# Patient Record
Sex: Female | Born: 1940 | Race: White | Hispanic: No | Marital: Married | Smoking: Former smoker
Health system: Southern US, Community
[De-identification: ages and names within clinical notes are randomized; demographics above are authoritative.]

## PROBLEM LIST (undated history)

## (undated) DIAGNOSIS — Z801 Family history of malignant neoplasm of trachea, bronchus and lung: Secondary | ICD-10-CM

## (undated) DIAGNOSIS — I10 Essential (primary) hypertension: Secondary | ICD-10-CM

## (undated) DIAGNOSIS — Z8042 Family history of malignant neoplasm of prostate: Secondary | ICD-10-CM

## (undated) DIAGNOSIS — Z923 Personal history of irradiation: Secondary | ICD-10-CM

## (undated) DIAGNOSIS — C50911 Malignant neoplasm of unspecified site of right female breast: Secondary | ICD-10-CM

## (undated) DIAGNOSIS — Z807 Family history of other malignant neoplasms of lymphoid, hematopoietic and related tissues: Secondary | ICD-10-CM

## (undated) DIAGNOSIS — M1990 Unspecified osteoarthritis, unspecified site: Secondary | ICD-10-CM

## (undated) DIAGNOSIS — Z803 Family history of malignant neoplasm of breast: Secondary | ICD-10-CM

## (undated) DIAGNOSIS — D649 Anemia, unspecified: Secondary | ICD-10-CM

## (undated) DIAGNOSIS — N189 Chronic kidney disease, unspecified: Secondary | ICD-10-CM

## (undated) DIAGNOSIS — C801 Malignant (primary) neoplasm, unspecified: Secondary | ICD-10-CM

## (undated) DIAGNOSIS — R7303 Prediabetes: Secondary | ICD-10-CM

## (undated) DIAGNOSIS — C50919 Malignant neoplasm of unspecified site of unspecified female breast: Secondary | ICD-10-CM

## (undated) HISTORY — DX: Family history of other malignant neoplasms of lymphoid, hematopoietic and related tissues: Z80.7

## (undated) HISTORY — DX: Family history of malignant neoplasm of prostate: Z80.42

## (undated) HISTORY — PX: EYE SURGERY: SHX253

## (undated) HISTORY — PX: COLONOSCOPY: SHX174

## (undated) HISTORY — DX: Family history of malignant neoplasm of trachea, bronchus and lung: Z80.1

## (undated) HISTORY — DX: Family history of malignant neoplasm of breast: Z80.3

## (undated) HISTORY — DX: Malignant (primary) neoplasm, unspecified: C80.1

## (undated) HISTORY — PX: MASTECTOMY: SHX3

## (undated) HISTORY — DX: Essential (primary) hypertension: I10

## (undated) HISTORY — DX: Malignant neoplasm of unspecified site of right female breast: C50.911

## (undated) HISTORY — PX: BREAST SURGERY: SHX581

---
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
## 2000-02-01 DIAGNOSIS — C50919 Malignant neoplasm of unspecified site of unspecified female breast: Secondary | ICD-10-CM

## 2000-02-01 HISTORY — DX: Malignant neoplasm of unspecified site of unspecified female breast: C50.919

## 2000-02-01 HISTORY — PX: BREAST LUMPECTOMY: SHX2

## 2009-01-31 HISTORY — PX: BREAST LUMPECTOMY: SHX2

## 2013-01-31 HISTORY — PX: CATARACT EXTRACTION: SUR2

## 2016-02-01 HISTORY — PX: ABDOMINAL HYSTERECTOMY: SHX81

## 2018-06-28 ENCOUNTER — Other Ambulatory Visit: Payer: Self-pay

## 2018-06-29 ENCOUNTER — Encounter: Payer: Self-pay | Admitting: Family Medicine

## 2018-06-29 ENCOUNTER — Ambulatory Visit (INDEPENDENT_AMBULATORY_CARE_PROVIDER_SITE_OTHER): Payer: Medicare Other | Admitting: Family Medicine

## 2018-06-29 ENCOUNTER — Telehealth: Payer: Self-pay | Admitting: Family Medicine

## 2018-06-29 VITALS — BP 122/64 | HR 85 | Temp 98.4°F | Resp 16 | Ht 60.5 in | Wt 152.0 lb

## 2018-06-29 DIAGNOSIS — R944 Abnormal results of kidney function studies: Secondary | ICD-10-CM

## 2018-06-29 DIAGNOSIS — Z1231 Encounter for screening mammogram for malignant neoplasm of breast: Secondary | ICD-10-CM | POA: Diagnosis not present

## 2018-06-29 DIAGNOSIS — E1169 Type 2 diabetes mellitus with other specified complication: Secondary | ICD-10-CM

## 2018-06-29 DIAGNOSIS — Z780 Asymptomatic menopausal state: Secondary | ICD-10-CM | POA: Diagnosis not present

## 2018-06-29 DIAGNOSIS — I10 Essential (primary) hypertension: Secondary | ICD-10-CM | POA: Insufficient documentation

## 2018-06-29 DIAGNOSIS — E785 Hyperlipidemia, unspecified: Secondary | ICD-10-CM | POA: Diagnosis not present

## 2018-06-29 DIAGNOSIS — B029 Zoster without complications: Secondary | ICD-10-CM

## 2018-06-29 DIAGNOSIS — K8020 Calculus of gallbladder without cholecystitis without obstruction: Secondary | ICD-10-CM

## 2018-06-29 DIAGNOSIS — F32A Depression, unspecified: Secondary | ICD-10-CM

## 2018-06-29 DIAGNOSIS — F329 Major depressive disorder, single episode, unspecified: Secondary | ICD-10-CM

## 2018-06-29 DIAGNOSIS — R1011 Right upper quadrant pain: Secondary | ICD-10-CM

## 2018-06-29 LAB — TSH: TSH: 3.06 u[IU]/mL (ref 0.35–4.50)

## 2018-06-29 LAB — COMPREHENSIVE METABOLIC PANEL
ALT: 13 U/L (ref 0–35)
AST: 16 U/L (ref 0–37)
Albumin: 4.1 g/dL (ref 3.5–5.2)
Alkaline Phosphatase: 79 U/L (ref 39–117)
BUN: 31 mg/dL — ABNORMAL HIGH (ref 6–23)
CO2: 28 mEq/L (ref 19–32)
Calcium: 9.9 mg/dL (ref 8.4–10.5)
Chloride: 99 mEq/L (ref 96–112)
Creatinine, Ser: 1.5 mg/dL — ABNORMAL HIGH (ref 0.40–1.20)
GFR: 33.75 mL/min — ABNORMAL LOW (ref >60.00)
Glucose, Bld: 118 mg/dL — ABNORMAL HIGH (ref 70–99)
Potassium: 3.9 mEq/L (ref 3.5–5.1)
Sodium: 135 mEq/L (ref 135–145)
Total Bilirubin: 0.4 mg/dL (ref 0.2–1.2)
Total Protein: 7.4 g/dL (ref 6.0–8.3)

## 2018-06-29 LAB — CBC WITH DIFFERENTIAL/PLATELET
Basophils Absolute: 0 10*3/uL (ref 0.0–0.1)
Basophils Relative: 0.7 % (ref 0.0–3.0)
Eosinophils Absolute: 0.1 10*3/uL (ref 0.0–0.7)
Eosinophils Relative: 1.4 % (ref 0.0–5.0)
HCT: 35.5 % — ABNORMAL LOW (ref 36.0–46.0)
Hemoglobin: 11.8 g/dL — ABNORMAL LOW (ref 12.0–15.0)
Lymphocytes Relative: 26.7 % (ref 12.0–46.0)
Lymphs Abs: 1.3 10*3/uL (ref 0.7–4.0)
MCHC: 33.3 g/dL (ref 30.0–36.0)
MCV: 77.6 fl — ABNORMAL LOW (ref 78.0–100.0)
Monocytes Absolute: 0.5 10*3/uL (ref 0.1–1.0)
Monocytes Relative: 10.3 % (ref 3.0–12.0)
Neutro Abs: 2.9 10*3/uL (ref 1.4–7.7)
Neutrophils Relative %: 60.9 % (ref 43.0–77.0)
Platelets: 213 10*3/uL (ref 150.0–400.0)
RBC: 4.57 Mil/uL (ref 3.87–5.11)
RDW: 14.9 % (ref 11.5–15.5)
WBC: 4.8 10*3/uL (ref 4.0–10.5)

## 2018-06-29 LAB — LIPID PANEL
Cholesterol: 135 mg/dL (ref 0–200)
HDL: 44.8 mg/dL (ref >39.00)
LDL Cholesterol: 70 mg/dL (ref 0–99)
NonHDL: 89.91
Total CHOL/HDL Ratio: 3
Triglycerides: 100 mg/dL (ref 0.0–149.0)
VLDL: 20 mg/dL (ref 0.0–40.0)

## 2018-06-29 LAB — LIPASE: Lipase: 69 U/L — ABNORMAL HIGH (ref 11.0–59.0)

## 2018-06-29 LAB — HEMOGLOBIN A1C: Hgb A1c MFr Bld: 6.4 % (ref 4.6–6.5)

## 2018-06-29 LAB — AMYLASE: Amylase: 78 U/L (ref 27–131)

## 2018-06-29 MED ORDER — VALACYCLOVIR HCL 1 G PO TABS: 1000.0000 mg | ORAL_TABLET | Freq: Three times a day (TID) | ORAL | 0 refills | Status: AC

## 2018-06-29 NOTE — Progress Notes (Signed)
Subjective:    Patient ID: Tracy Lawrence, female    DOB: Jun 23, 1940, 78 y.o.   MRN: [id number]  HPI   Patient presents to clinic to establish with PCP.  She recently moved to area from the [place] to be closer to her children and grandchildren.  Patient is a diabetic has high cholesterol, depression and history of gout.  Per patient she is stable on current medications.  She currently has not been checking her blood sugar regularly however has not felt any symptoms of hypoglycemia.  Mood is stable on current dose of Lexapro.  Denies any SI or HI.  She is happy to be closer to her family.  Tolerating simvastatin without any problems.  Denies body aches.  Patient has been having some right upper quadrant pain off and on.  Does not seem to coincide with any type of food or with eating.  States it will flareup very painful for a few minutes and then will ease off on its own.  Denies any vomiting or diarrhea.  Denies fever or chills.  Patient also has a rash on her back, not sure what the rash is from.  States the rash is a little itchy and is a little tender.  Patient Active Problem List   Diagnosis Date Noted  . Hyperlipidemia associated with type 2 diabetes mellitus ([place]) 06/29/2018  . Essential hypertension 06/29/2018   Past Medical History:  Diagnosis Date  . Cancer ([place])   . Diabetes mellitus without complication ([place])   . Hypertension    Past Surgical History:  Procedure Laterality Date  . ABDOMINAL HYSTERECTOMY  2018  . BREAST SURGERY  2011,2002  . CATARACT EXTRACTION  2015   Family History  Problem Relation Age of Onset  . Heart attack Mother   . Cancer Father    Review of Systems  Constitutional: Negative for chills, fatigue and fever.  HENT: Negative for congestion, ear pain, sinus pain and sore throat.   Eyes: Negative.   Respiratory: Negative for cough, shortness of breath and wheezing.   Cardiovascular: Negative for chest pain, palpitations and leg swelling.   Gastrointestinal: +RUQ ABD pain. Negative for diarrhea, nausea and vomiting.  Genitourinary: Negative for dysuria, frequency and urgency.  Musculoskeletal: Negative for arthralgias and myalgias.  Skin: Negative for color change, pallor. +rash on back  Neurological: Negative for syncope, light-headedness and headaches.  Psychiatric/Behavioral: The patient is not nervous/anxious.       Objective:   Physical Exam Vitals signs and nursing note reviewed.  Constitutional:      General: She is not in acute distress.    Appearance: She is not toxic-appearing.  HENT:     Head: Normocephalic and atraumatic.  Eyes:     General: No scleral icterus.    Extraocular Movements: Extraocular movements intact.     Conjunctiva/sclera: Conjunctivae normal.     Pupils: Pupils are equal, round, and reactive to light.  Cardiovascular:     Rate and Rhythm: Normal rate and regular rhythm.     Heart sounds: Normal heart sounds.  Pulmonary:     Effort: Pulmonary effort is normal. No respiratory distress.     Breath sounds: Normal breath sounds.  Abdominal:     General: Bowel sounds are normal. There is no distension.     Palpations: Abdomen is soft. There is no mass.     Tenderness: There is abdominal tenderness (RUQ tenderness). There is no right CVA tenderness, left CVA tenderness, guarding or rebound.  Skin:  General: Skin is warm and dry.     Coloration: Skin is not jaundiced or pale.     Findings: Rash present.          Comments: +red slightly raised rash on back indicated by red mark on diagram. Rash does appear to be an early shingles.   Neurological:     General: No focal deficit present.     Mental Status: She is alert and oriented to person, place, and time.     Motor: No weakness.     Gait: Gait normal.  Psychiatric:        Mood and Affect: Mood normal.        Behavior: Behavior normal.        Thought Content: Thought content normal.    Vitals:   06/29/18 1315  BP: 122/64   Pulse: 85  Resp: 16  Temp: 98.4 F (36.9 C)  SpO2: 96%      Assessment & Plan:   Hyperlipidemia associated with type 2 diabetes- patient tolerating simvastatin any problems.  Diabetes has been well controlled on metformin for many years.  She will continue this.  We will get new labs including lipid panel and A1c for continued monitoring.  Depressive disorder -- Mood stable on lexapro  Screening mammogram and DEXA scan ordered for breast cancer screening and also osteoporosis screening/monitoring due to patient being postmenopausal.  Right upper quadrant pain-pain could be related to issues with gallbladder.  We will get ultrasound to further investigate we also include amylase, lipase, CBC and CMP in her lab work.   Shingles -- Patient's rash on back does appear to be consistent with an early onset of shingles.  We will treat with Valtrex 3 times daily for 7 days.  Advised she can use Tylenol as needed for any pain.  Advised to let us know if rash gets bigger, more painful and we can give her something stronger for pain if needed.  Once we have lab results and ultrasound results we will better able to know next step in plan of care in regards to upper abdominal pain.  Advised to eat a bland diet and drink clear liquids over the next few days and monitor pain for any changes.  Patient will follow-up every 6 months for monitoring of chronic medical conditions.  She is aware she can return to clinic sooner if any issues arise.  She is also aware that we may need to have her come in sooner pending lab work results.

## 2018-06-29 NOTE — Patient Instructions (Addendum)
Flu vaccines will be available around September!

## 2018-06-29 NOTE — Telephone Encounter (Signed)
Need pharmacy to send valtrex to

## 2018-07-02 DIAGNOSIS — F329 Major depressive disorder, single episode, unspecified: Secondary | ICD-10-CM | POA: Insufficient documentation

## 2018-07-02 DIAGNOSIS — F32A Depression, unspecified: Secondary | ICD-10-CM | POA: Insufficient documentation

## 2018-07-03 NOTE — Telephone Encounter (Signed)
Called Pt and spoke to her about what pharmacy she uses and she stated [facility] on [address] in [place]

## 2018-07-04 ENCOUNTER — Other Ambulatory Visit (INDEPENDENT_AMBULATORY_CARE_PROVIDER_SITE_OTHER): Payer: Medicare Other

## 2018-07-04 ENCOUNTER — Other Ambulatory Visit: Payer: Self-pay

## 2018-07-04 DIAGNOSIS — R944 Abnormal results of kidney function studies: Secondary | ICD-10-CM | POA: Diagnosis not present

## 2018-07-04 DIAGNOSIS — N183 Type 2 diabetes mellitus with diabetic chronic kidney disease: Secondary | ICD-10-CM

## 2018-07-04 DIAGNOSIS — E1122 Type 2 diabetes mellitus with diabetic chronic kidney disease: Secondary | ICD-10-CM

## 2018-07-04 LAB — COMPREHENSIVE METABOLIC PANEL
ALT: 15 U/L (ref 0–35)
AST: 17 U/L (ref 0–37)
Albumin: 3.9 g/dL (ref 3.5–5.2)
Alkaline Phosphatase: 73 U/L (ref 39–117)
BUN: 21 mg/dL (ref 6–23)
CO2: 26 mEq/L (ref 19–32)
Calcium: 9.8 mg/dL (ref 8.4–10.5)
Chloride: 100 mEq/L (ref 96–112)
Creatinine, Ser: 1.37 mg/dL — ABNORMAL HIGH (ref 0.40–1.20)
GFR: 37.47 mL/min — ABNORMAL LOW (ref >60.00)
Glucose, Bld: 142 mg/dL — ABNORMAL HIGH (ref 70–99)
Potassium: 4 mEq/L (ref 3.5–5.1)
Sodium: 133 mEq/L — ABNORMAL LOW (ref 135–145)
Total Bilirubin: 0.3 mg/dL (ref 0.2–1.2)
Total Protein: 7.2 g/dL (ref 6.0–8.3)

## 2018-07-06 MED ORDER — LINAGLIPTIN 5 MG PO TABS: 5.0000 mg | ORAL_TABLET | Freq: Every day | ORAL | 1 refills | Status: DC

## 2018-07-06 NOTE — Addendum Note (Signed)
Addended by: Philis Nettle on: 07/06/2018 07:46 AM   Modules accepted: Orders

## 2018-07-10 ENCOUNTER — Ambulatory Visit: Payer: Medicare Other

## 2018-07-11 ENCOUNTER — Other Ambulatory Visit: Payer: Self-pay

## 2018-07-11 ENCOUNTER — Ambulatory Visit
Admission: RE | Admit: 2018-07-11 | Discharge: 2018-07-11 | Disposition: A | Discharge status: Home / Self Care | Payer: Medicare Other | Source: Ambulatory Visit | Attending: Family Medicine | Admitting: Family Medicine

## 2018-07-11 DIAGNOSIS — R1011 Right upper quadrant pain: Secondary | ICD-10-CM | POA: Diagnosis not present

## 2018-07-13 NOTE — Addendum Note (Signed)
Addended by: Philis Nettle on: 07/13/2018 08:32 AM   Modules accepted: Orders

## 2018-07-19 ENCOUNTER — Encounter: Payer: Self-pay | Admitting: Family Medicine

## 2018-07-24 ENCOUNTER — Other Ambulatory Visit: Payer: Self-pay | Admitting: Family Medicine

## 2018-07-24 DIAGNOSIS — Z1231 Encounter for screening mammogram for malignant neoplasm of breast: Secondary | ICD-10-CM

## 2018-07-25 ENCOUNTER — Encounter: Payer: Self-pay | Admitting: General Surgery

## 2018-07-25 ENCOUNTER — Other Ambulatory Visit: Payer: Self-pay

## 2018-07-25 ENCOUNTER — Ambulatory Visit (INDEPENDENT_AMBULATORY_CARE_PROVIDER_SITE_OTHER): Payer: Medicare Other | Admitting: General Surgery

## 2018-07-25 VITALS — BP 130/62 | HR 102 | Temp 97.3°F | Resp 18 | Ht 61.0 in | Wt 152.0 lb

## 2018-07-25 DIAGNOSIS — K8020 Calculus of gallbladder without cholecystitis without obstruction: Secondary | ICD-10-CM

## 2018-07-25 NOTE — Patient Instructions (Addendum)
Gallbladder Eating Plan If you have a gallbladder condition, you may have trouble digesting fats. Eating a low-fat diet can help reduce your symptoms, and may be helpful before and after having surgery to remove your gallbladder (cholecystectomy). Your health care provider may recommend that you work with a diet and nutrition specialist (dietitian) to help you reduce the amount of fat in your diet. What are tips for following this plan? General guidelines  Limit your fat intake to less than 30% of your total daily calories. If you eat around 1,800 calories each day, this is less than 60 grams (g) of fat per day.  Fat is an important part of a healthy diet. Eating a low-fat diet can make it hard to maintain a healthy body weight. Ask your dietitian how much fat, calories, and other nutrients you need each day.  Eat small, frequent meals throughout the day instead of three large meals.  Drink at least 8-10 cups of fluid a day. Drink enough fluid to keep your urine clear or pale yellow.  Limit alcohol intake to no more than 1 drink a day for nonpregnant women and 2 drinks a day for men. One drink equals 12 oz of beer, 5 oz of wine, or 1 oz of hard liquor. Reading food labels  Check Nutrition Facts on food labels for the amount of fat per serving. Choose foods with less than 3 grams of fat per serving. Shopping  Choose nonfat and low-fat healthy foods. Look for the words "nonfat," "low fat," or "fat free."  Avoid buying processed or prepackaged foods. Cooking  Cook using low-fat methods, such as baking, broiling, grilling, or boiling.  Cook with small amounts of healthy fats, such as olive oil, grapeseed oil, canola oil, or sunflower oil. What foods are recommended?   All fresh, frozen, or canned fruits and vegetables.  Whole grains.  Low-fat or non-fat (skim) milk and yogurt.  Lean meat, skinless poultry, fish, eggs, and beans.  Low-fat protein supplement powders or  drinks.  Spices and herbs. What foods are not recommended?  High-fat foods. These include baked goods, fast food, fatty cuts of meat, ice cream, french toast, sweet rolls, pizza, cheese bread, foods covered with butter, creamy sauces, or cheese.  Fried foods. These include french fries, tempura, battered fish, breaded chicken, fried breads, and sweets.  Foods with strong odors.  Foods that cause bloating and gas. Summary  A low-fat diet can be helpful if you have a gallbladder condition, or before and after gallbladder surgery.  Limit your fat intake to less than 30% of your total daily calories. This is about 60 g of fat if you eat 1,800 calories each day.  Eat small, frequent meals throughout the day instead of three large meals. This information is not intended to replace advice given to you by your health care provider. Make sure you discuss any questions you have with your health care provider. Document Released: 01/22/2013 Document Revised: 02/25/2016 Document Reviewed: 02/25/2016 Elsevier Interactive Patient Education  2019 Elsevier Inc.  

## 2018-07-25 NOTE — Progress Notes (Signed)
Patient ID: Tracy Lawrence, female   DOB: [birth year], 78 y.o.   MRN: [id number]  Chief Complaint  Patient presents with  . New Patient (Initial Visit)    Gallbladder    HPI Tracy Lawrence is a 78 y.o. female.  Tracy Lawrence has been referred by her primary care provider, Philis Nettle, FNP, for evaluation of cholelithiasis.  Tracy Lawrence states that about 2 weeks ago, Tracy Lawrence had a shingles attack.  At the same time, Tracy Lawrence experienced some right upper quadrant pain that radiated around her back.  Incidentally, this was in the same distribution of her shingles lesions.  Tracy Lawrence says that the pain only lasted for a couple of days and then resolved.  Tracy Lawrence denies any associated nausea or vomiting.  There did not seem to be any association with food intake.  Tracy Lawrence reports that Tracy Lawrence does not eat a lot of fatty foods to begin with.  Tracy Lawrence did not have any diarrhea or constipation.  Tracy Lawrence has never had pancreatitis, nor has Tracy Lawrence ever been jaundiced.  Laboratory studies did not show any hepatobiliary abnormalities.  A right upper quadrant ultrasound was performed that did show evidence of cholelithiasis without cholecystitis.   Past Medical History:  Diagnosis Date  . Cancer ([employer])   . Diabetes mellitus without complication ([place])   . Hypertension     Past Surgical History:  Procedure Laterality Date  . ABDOMINAL HYSTERECTOMY  2018  . BREAST SURGERY  2011,2002  . CATARACT EXTRACTION  2015    Family History  Problem Relation Age of Onset  . Heart attack Mother   . Cancer Father     Social History Social History   Tobacco Use  . Smoking status: Former Smoker    Quit date: 06/29/2018    Years since quitting: 0.0  . Smokeless tobacco: Never Used  Substance Use Topics  . Alcohol use: Yes    Comment: ocassionally  . Drug use: Never    Not on File  Current Outpatient Medications  Medication Sig Dispense Refill  . allopurinol (ZYLOPRIM) 100 MG tablet Take 100 mg by mouth daily.    Marland Kitchen escitalopram (LEXAPRO) 10 MG tablet  Take 10 mg by mouth daily.    Marland Kitchen linagliptin (TRADJENTA) 5 MG TABS tablet Take 1 tablet (5 mg total) by mouth daily. 90 tablet 1  . simvastatin (ZOCOR) 80 MG tablet Take 80 mg by mouth daily.     No current facility-administered medications for this visit.     Review of Systems Review of Systems  All other systems reviewed and are negative.   Blood pressure 130/62, pulse (!) 102, temperature (!) 97.3 F (36.3 C), temperature source Temporal, resp. rate 18, height 5\' 1"  (1.549 m), weight 152 lb (68.9 kg), SpO2 98 %.  Physical Exam Physical Exam Vitals signs reviewed.  Constitutional:      General: Tracy Lawrence is not in acute distress.    Appearance: Normal appearance.  HENT:     Head: Normocephalic and atraumatic.     Nose:     Comments: Covered with a mask secondary to COVID-19 precautions    Mouth/Throat:     Comments: Covered with a mask secondary to COVID-19 precautions Eyes:     General: No scleral icterus.       Right eye: No discharge.        Left eye: No discharge.     Conjunctiva/sclera: Conjunctivae normal.  Neck:     Musculoskeletal: Normal range of motion and neck supple.  Cardiovascular:  Rate and Rhythm: Normal rate and regular rhythm.     Pulses: Normal pulses.     Heart sounds: Normal heart sounds.  Pulmonary:     Effort: Pulmonary effort is normal.     Breath sounds: Normal breath sounds.  Abdominal:     General: Abdomen is flat. Bowel sounds are normal. There is no distension.     Palpations: Abdomen is soft. There is no mass.     Tenderness: There is no abdominal tenderness.     Comments: Laparoscopic port site scars from her hysterectomy.  Genitourinary:    Comments: Deferred Musculoskeletal: Normal range of motion.        General: No swelling.     Right lower leg: No edema.     Left lower leg: No edema.  Lymphadenopathy:     Cervical: No cervical adenopathy.  Skin:    General: Skin is warm and dry.     Coloration: Skin is not jaundiced.   Neurological:     General: No focal deficit present.     Mental Status: Tracy Lawrence is alert and oriented to person, place, and time.  Psychiatric:        Behavior: Behavior normal.     Data Reviewed Results for KRYSTALE, RINKENBERGER (MRN [id number]) as of 07/25/2018 09:52  Ref. Range 06/29/2018 13:35 07/04/2018 10:48  COMPREHENSIVE METABOLIC PANEL Unknown Rpt (A) Rpt (A)  Sodium Latest Ref Range: 135 - 145 mEq/L 135 133 (L)  Potassium Latest Ref Range: 3.5 - 5.1 mEq/L 3.9 4.0  Chloride Latest Ref Range: 96 - 112 mEq/L 99 100  CO2 Latest Ref Range: 19 - 32 mEq/L 28 26  Glucose Latest Ref Range: 70 - 99 mg/dL 118 (H) 142 (H)  BUN Latest Ref Range: 6 - 23 mg/dL 31 (H) 21  Creatinine Latest Ref Range: 0.40 - 1.20 mg/dL 1.50 (H) 1.37 (H)  Calcium Latest Ref Range: 8.4 - 10.5 mg/dL 9.9 9.8  Alkaline Phosphatase Latest Ref Range: 39 - 117 U/L 79 73  Albumin Latest Ref Range: 3.5 - 5.2 g/dL 4.1 3.9  Amylase, Serum Latest Ref Range: 27 - 131 U/L 78   Lipase Latest Ref Range: 11.0 - 59.0 U/L 69.0 (H)   AST Latest Ref Range: 0 - 37 U/L 16 17  ALT Latest Ref Range: 0 - 35 U/L 13 15  Total Protein Latest Ref Range: 6.0 - 8.3 g/dL 7.4 7.2  Total Bilirubin Latest Ref Range: 0.2 - 1.2 mg/dL 0.4 0.3  GFR Latest Ref Range: >60.00 mL/min 33.75 (L) 37.47 (L)  Total CHOL/HDL Ratio Unknown 3   Cholesterol Latest Ref Range: 0 - 200 mg/dL 135   HDL Cholesterol Latest Ref Range: >39.00 mg/dL 44.80   LDL (calc) Latest Ref Range: 0 - 99 mg/dL 70   NonHDL Unknown 89.91   Triglycerides Latest Ref Range: 0.0 - 149.0 mg/dL 100.0   VLDL Latest Ref Range: 0.0 - 40.0 mg/dL 20.0   WBC Latest Ref Range: 4.0 - 10.5 K/uL 4.8   RBC Latest Ref Range: 3.87 - 5.11 Mil/uL 4.57   Hemoglobin Latest Ref Range: 12.0 - 15.0 g/dL 11.8 (L)   HCT Latest Ref Range: 36.0 - 46.0 % 35.5 (L)   MCV Latest Ref Range: 78.0 - 100.0 fl 77.6 (L)   MCHC Latest Ref Range: 30.0 - 36.0 g/dL 33.3   RDW Latest Ref Range: 11.5 - 15.5 % 14.9   Platelets  Latest Ref Range: 150.0 - 400.0 K/uL 213.0   I personally reviewed these  labs.  As stated in the history of present illness, there is no evidence of hepatobiliary disease based upon these lab results  CLINICAL DATA:  RIGHT upper quadrant abdominal pain intermittently for 2 months  EXAM: ABDOMEN ULTRASOUND COMPLETE  COMPARISON:  None  FINDINGS: Gallbladder: Multiple gallstones in gallbladder up to 10 mm diameter. Small amount of sludge. No gallbladder wall thickening, pericholecystic fluid, or sonographic Murphy sign.  Common bile duct: Diameter: 6 mm diameter, normal for age  Liver: Normal echogenicity. No definite hepatic mass or nodularity. Portal vein is patent on color Doppler imaging with normal direction of blood flow towards the liver.  IVC: Normal appearance  Pancreas: Portions of head and tail incompletely visualized due to bowel gas. Visualized portions normal appearance.  Spleen: Normal appearance, 6.3 cm length  Right Kidney: Length: 7.4 cm. Atrophic appearance, with thinned cortex and increased cortical echogenicity. No definite mass hydronephrosis.  Left Kidney: Length: 9.7 cm. Normal cortical thickness. Increased cortical echogenicity. No mass, hydronephrosis or shadowing calcification.  Abdominal aorta: Normal caliber.  Other findings: No free fluid  IMPRESSION: Cholelithiasis.  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  Incomplete pancreatic visualization.  No acute abnormalities otherwise identified.   Electronically Signed   By: Lavonia Dana M.D.   On: 07/11/2018 15:35  I personally reviewed the ultrasound, and agree with the radiologist findings.  Specifically, Tracy Lawrence does have gallstones, but there is no evidence of cholecystitis or choledocholithiasis.  Assessment This is a 78 year old woman with a number of medical comorbidities.  Tracy Lawrence had one episode of right upper quadrant pain  about 2 weeks ago, in conjunction with a shingles attack.  While Tracy Lawrence does have cholelithiasis, this is been her only episode of right upper quadrant pain.  Plan I discussed cholelithiasis and gallbladder disease with the patient.  I explained that many people have gallstones but never go on to have any symptoms.  Tracy Lawrence has had a single episode of right upper quadrant pain, it happened during a shingles attack and her lesions were in the same distribution of the pain Tracy Lawrence experienced.  At this time, Tracy Lawrence does not need to have an operation, unless Tracy Lawrence specifically desires one.  For now, Tracy Lawrence would prefer to simply defer, unless Tracy Lawrence has further episodes of symptomatic cholelithiasis.  We have not made a specific return appointment for her, however Tracy Lawrence is welcome to return at any time.    Fredirick Maudlin 07/25/2018, 9:48 AM

## 2018-09-11 ENCOUNTER — Ambulatory Visit
Admission: RE | Admit: 2018-09-11 | Discharge: 2018-09-11 | Disposition: A | Discharge status: Home / Self Care | Payer: Medicare Other | Source: Ambulatory Visit | Attending: Family Medicine | Admitting: Family Medicine

## 2018-09-11 DIAGNOSIS — Z780 Asymptomatic menopausal state: Secondary | ICD-10-CM

## 2018-09-11 DIAGNOSIS — Z1231 Encounter for screening mammogram for malignant neoplasm of breast: Secondary | ICD-10-CM | POA: Diagnosis present

## 2018-10-04 HISTORY — PX: BREAST BIOPSY: SHX20

## 2018-10-17 ENCOUNTER — Other Ambulatory Visit: Payer: Self-pay

## 2018-10-17 ENCOUNTER — Ambulatory Visit (INDEPENDENT_AMBULATORY_CARE_PROVIDER_SITE_OTHER): Payer: Medicare Other

## 2018-10-17 DIAGNOSIS — Z0000 Encounter for general adult medical examination without abnormal findings: Secondary | ICD-10-CM | POA: Diagnosis not present

## 2018-10-17 NOTE — Progress Notes (Signed)
Subjective:   Louberta Heldreth is a 78 y.o. female who presents for an Initial Medicare Annual Wellness Visit.  Review of Systems    No ROS.  Medicare Wellness Virtual Visit.  Visual/audio telehealth visit, UTA vital signs.   See social history for additional risk factors.    Cardiac Risk Factors include: advanced age (>16men, >51 women);hypertension     Objective:    Today's Vitals   There is no height or weight on file to calculate BMI.  Advanced Directives 10/17/2018  Does Patient Have a Medical Advance Directive? Yes  Type of Advance Directive Living will;Healthcare Power of Attorney  Does patient want to make changes to medical advance directive? No - Patient declined  Copy of [place] in Chart? No - copy requested    Current Medications (verified) Outpatient Encounter Medications as of 10/17/2018  Medication Sig  . allopurinol (ZYLOPRIM) 100 MG tablet Take 100 mg by mouth daily.  Marland Kitchen escitalopram (LEXAPRO) 10 MG tablet Take 10 mg by mouth daily.  Marland Kitchen linagliptin (TRADJENTA) 5 MG TABS tablet Take 1 tablet (5 mg total) by mouth daily.  . simvastatin (ZOCOR) 80 MG tablet Take 80 mg by mouth daily.   No facility-administered encounter medications on file as of 10/17/2018.     Allergies (verified) Patient has no allergy information on record.   History: Past Medical History:  Diagnosis Date  . Cancer ([place])   . Diabetes mellitus without complication ([place])   . Hypertension    Past Surgical History:  Procedure Laterality Date  . ABDOMINAL HYSTERECTOMY  2018  . BREAST LUMPECTOMY Right 2002   breast ca with rad  . BREAST LUMPECTOMY Left 2011   breast ca with rad  . BREAST SURGERY  2011,2002  . CATARACT EXTRACTION  2015   Family History  Problem Relation Age of Onset  . Heart attack Mother   . Cancer Father   . Breast cancer Sister 39       dx twice age 57   Social History   Socioeconomic History  . Marital status: Married    Spouse name:  Not on file  . Number of children: Not on file  . Years of education: Not on file  . Highest education level: Not on file  Occupational History  . Not on file  Social Needs  . Financial resource strain: Not hard at all  . Food insecurity    Worry: Never true    Inability: Never true  . Transportation needs    Medical: No    Non-medical: No  Tobacco Use  . Smoking status: Former Smoker    Quit date: 06/29/2018    Years since quitting: 0.3  . Smokeless tobacco: Never Used  Substance and Sexual Activity  . Alcohol use: Yes    Comment: ocassionally  . Drug use: Never  . Sexual activity: Not on file  Lifestyle  . Physical activity    Days per week: 3 days    Minutes per session: 50 min  . Stress: Not at all  Relationships  . Social Herbalist on phone: Not on file    Gets together: Not on file    Attends religious service: Not on file    Active member of club or organization: Not on file    Attends meetings of clubs or organizations: Not on file    Relationship status: Not on file  Other Topics Concern  . Not on file  Social History  Narrative  . Not on file    Tobacco Counseling Counseling given: Not Answered   Clinical Intake:  Pre-visit preparation completed: Yes        Diabetes: Yes(Followed by pcp)  How often do you need to have someone help you when you read instructions, pamphlets, or other written materials from your doctor or pharmacy?: 1 - Never  Interpreter Needed?: No      Activities of Daily Living In your present state of health, do you have any difficulty performing the following activities: 10/17/2018  Hearing? N  Vision? N  Difficulty concentrating or making decisions? N  Walking or climbing stairs? N  Dressing or bathing? N  Doing errands, shopping? N  Preparing Food and eating ? N  Using the Toilet? N  In the past six months, have you accidently leaked urine? N  Do you have problems with loss of bowel control? N   Managing your Medications? N  Managing your Finances? N  Housekeeping or managing your Housekeeping? N     Immunizations and Health Maintenance  There is no immunization history on file for this patient. Health Maintenance Due  Topic Date Due  . FOOT EXAM  05/01/1950  . OPHTHALMOLOGY EXAM  05/01/1952  . URINE MICROALBUMIN  05/01/1952  . TETANUS/TDAP  05/01/1961  . PNA vac Low Risk Adult (1 of 2 - PCV13) 05/02/2007    Patient Care Team: Jodelle Green, FNP as PCP - General (Family Medicine)  Indicate any recent Medical Services you may have received from other than [facility] providers in the past year (date may be approximate).     Assessment:   This is a routine wellness examination for Sheldon.  I connected with patient 10/17/18 at  9:30 AM EDT by an audio enabled telemedicine application and verified that I am speaking with the correct person using two identifiers. Patient stated full name and DOB. Patient gave permission to continue with virtual visit. Patient's location was at home and Nurse's location was at [place] office.   Health Maintenance Due: -Influenza vaccine 2020- discussed; to be completed in season with doctor or local pharmacy.   -PNA - discussed; she thinks she had PVC 13 but plans to verify with her doctor -Tdap- discussed; to be completed with doctor in visit or local pharmacy.   -Eye Exam- she plans to schedule once established -Foot Exam- followed by pcp -Hgb A1c- 06/29/18 (6.4) Update all pending maintenance due as appropriate.   See completed HM at the end of note.   Eye: Visual acuity not assessed. Virtual visit. Wears corrective lenses.  Retinopathy- none reported  Dental: Visits every 12 months.    Hearing: Demonstrates normal hearing during visit.  Safety:  Patient feels safe at home- yes Patient does have smoke detectors at home- yes Patient does wear sunscreen or protective clothing when in direct sunlight - yes Patient does wear seat  belt when in a moving vehicle - yes Patient drives- yes Adequate lighting in walkways free from debris- yes Grab bars and handrails used as appropriate- yes Ambulates with no assistive device Cell phone on person when ambulating outside of the home- yes  Social: Alcohol intake - yes      Smoking history- former  Smokers in home? none Illicit drug use? none  Depression: PHQ 2 &9 complete. See screening below. Denies irritability, anhedonia, sadness/tearfullness.  Stable. Taking medication as appropriate.   Falls: See screening below.    Medication: Taking as directed and without issues.  Covid-19: Precautions and sickness symptoms discussed. Wears mask, social distancing, hand hygiene as appropriate.   Activities of Daily Living Patient denies needing assistance with: household chores, feeding themselves, getting from bed to chair, getting to the toilet, bathing/showering, dressing, managing money, or preparing meals.   Memory: Patient is alert. Patient denies difficulty focusing or concentrating. Correctly identified the president of the [place], season and recall. Patient likes to read, play computer games, complete puzzles for brain stimulation.  BMI- discussed the importance of a healthy diet, water intake and the benefits of aerobic exercise.  Educational material provided.  Physical activity-   Diet: Diabetic/low carb Water: good intake  Advanced Directive: End of life planning; Advance aging; Advanced directives discussed.  Copy of current HCPOA/Living Will requested.    Other Providers Patient Care Team: Jodelle Green, FNP as PCP - General (Family Medicine)  Hearing/Vision screen  Hearing Screening   125Hz  250Hz  500Hz  1000Hz  2000Hz  3000Hz  4000Hz  6000Hz  8000Hz   Right ear:           Left ear:           Comments: Patient is able to hear conversational tones without difficulty.  No issues reported.  Vision Screening Comments: Wears corrective lenses Cataract  extraction, bilateral Macular degeneration Visual acuity not assessed, virtual visit.  They have seen their ophthalmologist in the last 12 months.     Dietary issues and exercise activities discussed: Current Exercise Habits: Home exercise routine, Type of exercise: walking, Time (Minutes): 45, Frequency (Times/Week): 3, Weekly Exercise (Minutes/Week): 135, Intensity: Mild  Goals      Patient Stated   . DIET - INCREASE WATER INTAKE (pt-stated)     Drink 64 ounces daily      Depression Screen PHQ 2/9 Scores 10/17/2018 06/29/2018  PHQ - 2 Score 0 0  PHQ- 9 Score - 0    Fall Risk Fall Risk  10/17/2018 07/25/2018 06/29/2018  Falls in the past year? 0 0 1  Number falls in past yr: - - 0  Injury with Fall? - - 0  Follow up - - Falls evaluation completed   Timed Get Up and Go Performed no, virtual visit  Cognitive Function:     6CIT Screen 10/17/2018  What Year? 0 points  What month? 0 points  What time? 0 points  Count back from 20 0 points  Months in reverse 0 points  Repeat phrase 0 points  Total Score 0    Screening Tests Health Maintenance  Topic Date Due  . FOOT EXAM  05/01/1952  . OPHTHALMOLOGY EXAM  05/01/1952  . URINE MICROALBUMIN  05/01/1952  . TETANUS/TDAP  05/01/1961  . PNA vac Low Risk Adult (1 of 2 - PCV13) 05/02/2007  . INFLUENZA VACCINE  05/01/2019 (Originally 09/01/2018)  . HEMOGLOBIN A1C  12/30/2018  . DEXA SCAN  Completed     Plan:   Keep all routine maintenance appointments.   Follow up 10/22/18 @9 :00  6 month follow up 01/01/19  I have personally reviewed and noted the following in the patient's chart:   . Medical and social history . Use of alcohol, tobacco or illicit drugs  . Current medications and supplements . Functional ability and status . Nutritional status . Physical activity . Advanced directives . List of other physicians . Hospitalizations, surgeries, and ER visits in previous 12 months . Vitals . Screenings to include  cognitive, depression, and falls . Referrals and appointments  In addition, I have reviewed and discussed with patient certain preventive  protocols, quality metrics, and best practice recommendations. A written personalized care plan for preventive services as well as general preventive health recommendations were provided to patient.     Varney Biles, LPN   [id number]

## 2018-10-17 NOTE — Patient Instructions (Addendum)
  Tracy Lawrence , Thank you for taking time to come for your Medicare Wellness Visit. I appreciate your ongoing commitment to your health goals. Please review the following plan we discussed and let me know if I can assist you in the future.   These are the goals we discussed: Goals      Patient Stated   . DIET - INCREASE WATER INTAKE (pt-stated)     Drink 64 ounces daily       This is a list of the screening recommended for you and due dates:  Health Maintenance  Topic Date Due  . Complete foot exam   05/01/1952  . Eye exam for diabetics  05/01/1952  . Urine Protein Check  05/01/1952  . Tetanus Vaccine  05/01/1961  . Pneumonia vaccines (1 of 2 - PCV13) 05/02/2007  . Flu Shot  05/01/2019*  . Hemoglobin A1C  12/30/2018  . DEXA scan (bone density measurement)  Completed  *Topic was postponed. The date shown is not the original due date.

## 2018-10-22 ENCOUNTER — Other Ambulatory Visit: Payer: Self-pay

## 2018-10-22 ENCOUNTER — Ambulatory Visit (INDEPENDENT_AMBULATORY_CARE_PROVIDER_SITE_OTHER): Payer: Medicare Other | Admitting: Family Medicine

## 2018-10-22 DIAGNOSIS — E1122 Type 2 diabetes mellitus with diabetic chronic kidney disease: Secondary | ICD-10-CM | POA: Diagnosis not present

## 2018-10-22 DIAGNOSIS — E1169 Type 2 diabetes mellitus with other specified complication: Secondary | ICD-10-CM | POA: Diagnosis not present

## 2018-10-22 DIAGNOSIS — I10 Essential (primary) hypertension: Secondary | ICD-10-CM

## 2018-10-22 DIAGNOSIS — F329 Major depressive disorder, single episode, unspecified: Secondary | ICD-10-CM | POA: Diagnosis not present

## 2018-10-22 DIAGNOSIS — F32A Depression, unspecified: Secondary | ICD-10-CM

## 2018-10-22 DIAGNOSIS — N183 Chronic kidney disease, stage 3 unspecified: Secondary | ICD-10-CM | POA: Insufficient documentation

## 2018-10-22 DIAGNOSIS — E785 Hyperlipidemia, unspecified: Secondary | ICD-10-CM

## 2018-10-22 NOTE — Progress Notes (Signed)
Patient ID: Tracy Lawrence, female   DOB: 1940-11-21, 78 y.o.   MRN: [id number]    Virtual Visit via phone Note  This visit type was conducted due to national recommendations for restrictions regarding the COVID-19 pandemic (e.g. social distancing).  This format is felt to be most appropriate for this patient at this time.  All issues noted in this document were discussed and addressed.  No physical exam was performed (except for noted visual exam findings with Video Visits).   I connected with Tracy Lawrence today at  9:00 AM EDT by telephone and verified that I am speaking with the correct person using two identifiers. Location patient: home Location provider: work or home office Persons participating in the virtual visit: patient, provider  I discussed the limitations, risks, security and privacy concerns of performing an evaluation and management service by telephone and the availability of in person appointments. I also discussed with the patient that there may be a patient responsible charge related to this service. The patient expressed understanding and agreed to proceed.   HPI:  Patient and I connected via telephone to follow-up on diabetes, mood, blood pressure, lipids and kidney disease.  Blood sugars have been stable on current medications.  Denies any hypo-or hyperglycemia.  Numbers have been running mainly between 100-150.  Blood pressure has been stable.  Last checked couple of days ago, was 124/76.  Tolerating statin without any adverse side effects.  Denies any body aches, joint pains or abdominal issues.  She did see a kidney doctor guards to CKD stage III that was revealed by her lab work.  At this time kidney specialist recommend she stay on all current medicines, and we will keep close eye on her kidney numbers in lab work.    ROS:   Constitutional: Negative for chills, fatigue and fever.  HENT: Negative for congestion, ear pain, sinus pain and sore throat.    Eyes: Negative.   Respiratory: Negative for cough, shortness of breath and wheezing.   Cardiovascular: Negative for chest pain, palpitations and leg swelling.  Gastrointestinal: Negative for abdominal pain, diarrhea, nausea and vomiting.  Genitourinary: Negative for dysuria, frequency and urgency.  Musculoskeletal: Negative for arthralgias and myalgias.  Skin: Negative for color change, pallor and rash.  Neurological: Negative for syncope, light-headedness and headaches.  Psychiatric/Behavioral: The patient is not nervous/anxious.     Past Medical History:  Diagnosis Date  . Cancer ([place])   . Diabetes mellitus without complication ([place])   . Hypertension     Past Surgical History:  Procedure Laterality Date  . ABDOMINAL HYSTERECTOMY  2018  . BREAST LUMPECTOMY Right 2002   breast ca with rad  . BREAST LUMPECTOMY Left 2011   breast ca with rad  . BREAST SURGERY  2011,2002  . CATARACT EXTRACTION  2015    Family History  Problem Relation Age of Onset  . Heart attack Mother   . Cancer Father   . Breast cancer Sister 59       dx twice age 2   Social History   Tobacco Use  . Smoking status: Former Smoker    Quit date: 06/29/2018    Years since quitting: 0.3  . Smokeless tobacco: Never Used  Substance Use Topics  . Alcohol use: Yes    Comment: ocassionally    Current Outpatient Medications:  .  allopurinol (ZYLOPRIM) 100 MG tablet, Take 100 mg by mouth daily., Disp: , Rfl:  .  escitalopram (LEXAPRO) 10 MG tablet, Take 10 mg  by mouth daily., Disp: , Rfl:  .  linagliptin (TRADJENTA) 5 MG TABS tablet, Take 1 tablet (5 mg total) by mouth daily., Disp: 90 tablet, Rfl: 1 .  simvastatin (ZOCOR) 80 MG tablet, Take 80 mg by mouth daily., Disp: , Rfl:   EXAM: GENERAL: alert, oriented, sounds well and in no acute distress  LUNGS: Speaking in full sentences, no signs of respiratory distress, breathing rate appears normal, no obvious gross SOB, gasping or wheezing  PSYCH/NEURO:  pleasant and cooperative, no obvious depression or anxiety, speech and thought processing grossly intact  ASSESSMENT AND PLAN:  Discussed the following assessment and plan:  1. 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 Hyperlipidemia associated with type 2 diabetes mellitus ([place]) Tolerating statin with no issues, will continue  3. Essential hypertension BP has been stable  4. CKD stage 3 due to type 2 diabetes mellitus (HCC) Diabetes stable. Will continue to keep close eye on kidney numbers  Plans to get flu vaccine at [facility]     I discussed the assessment and treatment plan with the patient. The patient was provided an opportunity to ask questions and all were answered. The patient agreed with the plan and demonstrated an understanding of the instructions.   The patient was advised to call back or seek an in-person evaluation if the symptoms worsen or if the condition fails to improve as anticipated.  I provided 13 minutes of non-face-to-face time during this encounter.   Jodelle Green, FNP

## 2018-10-23 ENCOUNTER — Inpatient Hospital Stay: Payer: Medicare Other | Attending: Internal Medicine | Admitting: Internal Medicine

## 2018-10-23 DIAGNOSIS — D472 Monoclonal gammopathy: Secondary | ICD-10-CM | POA: Insufficient documentation

## 2018-10-23 NOTE — Progress Notes (Deleted)
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  female has been referred to [place] by nephrology/PCP for further evaluation/work-up for monoclonal gammopathy...  No unusual bone pain.  Chronic mild fatigue.  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  Discussed regarding iron supplementation-p.o. if not improved discussed regarding IV iron infusion.  Discussed regarding need for erythropoietin stimulating agents if hemoglobin still continues to run less than 10.   #Chronic kidney disease-stage- ; followed by nephrology/PCP.    ROS  MEDICAL HISTORY:  Past Medical History:  Diagnosis Date  . Cancer ([place])   . Diabetes mellitus without complication ([place])   . Hypertension     SURGICAL HISTORY: Past Surgical History:  Procedure Laterality Date  . ABDOMINAL HYSTERECTOMY  2018  . BREAST LUMPECTOMY Right 2002   breast ca with rad  . BREAST LUMPECTOMY Left 2011   breast ca with rad  . BREAST SURGERY  2011,2002  . CATARACT EXTRACTION  2015    SOCIAL HISTORY: Social History   Socioeconomic History  . Marital status: Married    Spouse name: Not on file  . Number of children: Not on file  . Years of education: Not on file  . Highest education level: Not on file  Occupational History  . Not on file  Social Needs  . Financial resource strain: Not hard at all  . Food insecurity    Worry: Never true    Inability: Never  true  . Transportation needs    Medical: No    Non-medical: No  Tobacco Use  . Smoking status: Former Smoker    Quit date: 06/29/2018    Years since quitting: 0.3  . Smokeless tobacco: Never Used  Substance and Sexual Activity  . Alcohol use: Yes    Comment: ocassionally  . Drug use: Never  . Sexual activity: Not on file  Lifestyle  . Physical activity    Days per week: 3 days    Minutes per session: 50 min  . Stress: Not at all  Relationships  . Social Herbalist on phone: Not on file    Gets together: Not on file    Attends religious service: Not on file    Active member of club or organization: Not on file    Attends meetings of clubs or organizations: Not on file    Relationship status: Not on file  . Intimate partner violence    Fear of current or ex partner: No    Emotionally abused: No    Physically abused: No    Forced sexual activity: No  Other Topics Concern  . Not on file  Social History Narrative  . Not on file    FAMILY HISTORY: Family History  Problem Relation Age of Onset  . Heart attack Mother   . Cancer Father   . Breast cancer Sister 79       dx twice age 107    ALLERGIES:  has no allergies on file.  MEDICATIONS:  Current Outpatient Medications  Medication Sig Dispense Refill  . allopurinol (ZYLOPRIM) 100 MG tablet Take 100 mg by mouth daily.    Marland Kitchen escitalopram (LEXAPRO) 10 MG tablet Take 10 mg by mouth daily.    Marland Kitchen linagliptin (TRADJENTA) 5 MG TABS tablet Take 1 tablet (5 mg total) by mouth daily. 90 tablet 1  . simvastatin (ZOCOR) 80 MG tablet Take 80 mg by mouth daily.     No current facility-administered medications for this visit.       PHYSICAL EXAMINATION:   There were no vitals filed for this visit. There were no vitals filed for this visit.  Physical Exam  LABORATORY DATA:  I have reviewed the data as listed Lab Results  Component Value Date   WBC 4.8 06/29/2018   HGB 11.8 (L) 06/29/2018   HCT 35.5 (L)  06/29/2018   MCV 77.6 (L) 06/29/2018   PLT 213.0 06/29/2018   Recent Labs    06/29/18 1335 07/04/18 1048  NA 135 133*  K 3.9 4.0  CL 99 100  CO2 28 26  GLUCOSE 118* 142*  BUN 31* 21  CREATININE 1.50* 1.37*  CALCIUM 9.9 9.8  PROT 7.4 7.2  ALBUMIN 4.1 3.9  AST 16 17  ALT 13 15  ALKPHOS 79 73  BILITOT 0.4 0.3     No results found.  No problem-specific Assessment & Plan notes found for this encounter.    All questions were answered. The patient knows to call the clinic with any problems, questions or concerns.      Cammie Sickle, MD 10/23/2018 8:15 AM

## 2018-12-03 ENCOUNTER — Other Ambulatory Visit: Payer: Self-pay | Admitting: *Deleted

## 2018-12-03 DIAGNOSIS — Z20822 Contact with and (suspected) exposure to covid-19: Secondary | ICD-10-CM

## 2018-12-04 LAB — NOVEL CORONAVIRUS, NAA: SARS-CoV-2, NAA: NOT DETECTED

## 2019-01-01 ENCOUNTER — Ambulatory Visit: Payer: Medicare Other | Admitting: Family Medicine

## 2019-01-14 ENCOUNTER — Encounter: Payer: Medicare Other | Admitting: Family

## 2019-01-18 ENCOUNTER — Other Ambulatory Visit: Payer: Self-pay | Admitting: *Deleted

## 2019-01-18 DIAGNOSIS — N183 Type 2 diabetes mellitus with diabetic chronic kidney disease: Secondary | ICD-10-CM

## 2019-01-18 DIAGNOSIS — E1122 Type 2 diabetes mellitus with diabetic chronic kidney disease: Secondary | ICD-10-CM

## 2019-01-18 NOTE — Telephone Encounter (Signed)
Okay to refill? Former [facility] patient

## 2019-01-20 MED ORDER — LINAGLIPTIN 5 MG PO TABS: 5.0000 mg | ORAL_TABLET | Freq: Every day | ORAL | 1 refills | Status: DC

## 2019-01-28 ENCOUNTER — Encounter: Payer: Self-pay | Admitting: Family

## 2019-01-28 ENCOUNTER — Ambulatory Visit (INDEPENDENT_AMBULATORY_CARE_PROVIDER_SITE_OTHER): Payer: Medicare Other | Admitting: Family

## 2019-01-28 ENCOUNTER — Telehealth: Payer: Self-pay | Admitting: Family

## 2019-01-28 ENCOUNTER — Other Ambulatory Visit: Payer: Self-pay

## 2019-01-28 DIAGNOSIS — E1122 Type 2 diabetes mellitus with diabetic chronic kidney disease: Secondary | ICD-10-CM

## 2019-01-28 DIAGNOSIS — N183 Type 2 diabetes mellitus with diabetic chronic kidney disease: Secondary | ICD-10-CM

## 2019-01-28 DIAGNOSIS — E1169 Type 2 diabetes mellitus with other specified complication: Secondary | ICD-10-CM

## 2019-01-28 DIAGNOSIS — I10 Essential (primary) hypertension: Secondary | ICD-10-CM

## 2019-01-28 DIAGNOSIS — E785 Hyperlipidemia, unspecified: Secondary | ICD-10-CM

## 2019-01-28 NOTE — Telephone Encounter (Signed)
Would you call Dr Elwyn Lade office and see if he would order labs for [place] tomorrow? We saw her virtually and she sees him in person  We would like uric acid and a1c if possible

## 2019-01-28 NOTE — Progress Notes (Signed)
Verbal consent for services obtained from patient prior to services given to TELEPHONE visit:   Location of call:  provider at work patient at home  Names of all persons present for services: Tracy Paris, NP Chief complaint:  Feels well. No complaints.   HTN- 127/67 at home. Denies exertional chest pain or pressure, numbness or tingling radiating to left arm or jaw, palpitations, dizziness, frequent headaches, changes in vision, or shortness of breath.    DM-compliant with .  Last a1c 6.4   CKD- follows with Dr Holley Raring  H/o gout- takes allopurinol everyday.    History, background, results pertinent:  Up-to-date mammogram  thinks due for colonoscopy- around 8 years ago. Declines colonoscopy.  No changes to bowel habits or abdominal pain  Believes she had pneumococcal vaccine in the past as well as shingles. A/P/next steps: Problem List Items Addressed This Visit      Cardiovascular and Mediastinum   Essential hypertension    Doing well on current regimen, will continue      Relevant Medications   losartan (COZAAR) 50 MG tablet     Endocrine   CKD stage 3 due to type 2 diabetes mellitus (HCC)    Also Dr. Holley Raring, will follow      Relevant Medications   losartan (COZAAR) 50 MG tablet   Hyperlipidemia associated with type 2 diabetes mellitus ([place])    Last LDL=70 and a1c 6.4, will continue regimen  Of note, patient is seeing nephrologist in office tomorrow, we are calling his office to see if he would add on uric acid, A1c.      Relevant Medications   losartan (COZAAR) 50 MG tablet    Other Visit Diagnoses    Type 2 diabetes mellitus with stage 3 chronic kidney disease, without long-term current use of insulin (HCC)       Relevant Medications   losartan (COZAAR) 50 MG tablet     Of note, she declines pursuing colonoscopy now or in the future.  She will let me know if she changes her mind.  She will also drop off immunization records so we can ensure she is  up-to-date on pneumonia, Tdap.  I spent 20 min  discussing plan of care over the phone.

## 2019-01-28 NOTE — Assessment & Plan Note (Signed)
Doing well on current regimen, will continue 

## 2019-01-28 NOTE — Assessment & Plan Note (Signed)
Also Dr. Holley Raring, will follow

## 2019-01-28 NOTE — Assessment & Plan Note (Addendum)
Last LDL=70 and a1c 6.4, will continue regimen  Of note, patient is seeing nephrologist in office tomorrow, we are calling his office to see if he would add on uric acid, A1c.

## 2019-01-28 NOTE — Telephone Encounter (Signed)
I spoke with Anderson Malta at Dr. Elwyn Lade office & she has added on a1c as well as Uric Acid.

## 2019-02-07 ENCOUNTER — Other Ambulatory Visit: Payer: Self-pay

## 2019-02-07 ENCOUNTER — Telehealth: Payer: Self-pay | Admitting: Family

## 2019-02-07 MED ORDER — LOSARTAN POTASSIUM 50 MG PO TABS: 50.0000 mg | ORAL_TABLET | Freq: Every day | ORAL | 2 refills | Status: DC

## 2019-02-07 NOTE — Telephone Encounter (Signed)
Refill sent to pharmacy.   

## 2019-02-07 NOTE — Telephone Encounter (Signed)
Pt needs refill on losartan (COZAAR) 50 MG tablet sent to [facility] on [address]

## 2019-02-12 ENCOUNTER — Telehealth: Payer: Self-pay | Admitting: Family Medicine

## 2019-02-12 ENCOUNTER — Other Ambulatory Visit: Payer: Self-pay | Admitting: Family

## 2019-02-12 ENCOUNTER — Other Ambulatory Visit: Payer: Self-pay

## 2019-02-12 DIAGNOSIS — I10 Essential (primary) hypertension: Secondary | ICD-10-CM

## 2019-02-12 DIAGNOSIS — E1122 Type 2 diabetes mellitus with diabetic chronic kidney disease: Secondary | ICD-10-CM

## 2019-02-12 MED ORDER — LOSARTAN POTASSIUM 25 MG PO TABS: 25.0000 mg | ORAL_TABLET | Freq: Every day | ORAL | 2 refills | Status: DC

## 2019-02-12 NOTE — Telephone Encounter (Signed)
I called patient & she stated that initially she was put on losartan 25 mg by her provider in [place]. I told patient that we had no record of this. I only see the 50 mg. She said that she has been ou of medication for 5days now. Her Bp yesterday was 169-78 & today was 137/74. Should I send in the 25 mg/ Pt refused thr 50 mg which was refilled last  week bc she had never taken it before.

## 2019-02-12 NOTE — Telephone Encounter (Signed)
I sent in losartan 25 mg for patient & made sure she was aware of this. No appointments available until 2/10 so U have scheduled her for then. I asked that she please keep BP log & let us know if her readings are too high or low in the meantime.

## 2019-02-12 NOTE — Telephone Encounter (Signed)
Call pt May send in 25mg  PO QD losartan and advise pt to have follow up with me in a couple of weeks ; we can ensure BP close to goal 130/80.

## 2019-02-12 NOTE — Telephone Encounter (Signed)
Patient called me back & stated that she went to pick up script tradjenta that Dr. Biagio Quint prescribed was over $400 so she refused. She would like to know if okay to go back on metformin since that was affordable? Also she was taken off the escitalopram she was taking & she feels she needs to restart this medication. Okay to send for patient? She is scheduled with you 2/10.

## 2019-02-12 NOTE — Telephone Encounter (Signed)
Pt's losartan (COZAAR) 50 MG tablet was refilled on 02/07/2019. Pt states that she does not take 50mg  she takes 25mg . Pt is out of the 25mg . Please advise patient.

## 2019-02-13 ENCOUNTER — Other Ambulatory Visit: Payer: Self-pay

## 2019-02-13 MED ORDER — ESCITALOPRAM OXALATE 10 MG PO TABS: ORAL_TABLET | ORAL | 0 refills | Status: DC

## 2019-02-13 NOTE — Telephone Encounter (Signed)
I called and spoke with patient to let her know that lexapro was refilled for her. I also advised to please jot take any metformin due to her GFR. I told her that tradjenta was d/c & that Joycelyn Schmid wpuld prefer she try to control with diet as well as exercise. I reminded patient to always limit starches as well as carbohydrates since they raise blood sugars so significantly.   Pt has follow-up scheduled 03/13/19 & will scheduled labs at that time.

## 2019-02-13 NOTE — Telephone Encounter (Signed)
Call pt Based on her last A1c in the summer, she was very well controlled. She doesn't need tradjenta at this time unless A1c is very high. I have dced it for now.    I would advise that she is not on Metformin  now nor in the future.  It is contraindicated based on her GFR being as low as it is.  Metformin can cause serious injury to her kidneys.  She follows with nephrology. I would recommend controlling her sugar with diet.  I am happy to refer her to a nutritionist if she thinks that would be helpful.  I have ordered baseline fasting labs, which she willing to come in to have these done?  Please refill lexapro    Notes for me:   Wt Readings from Last 3 Encounters:  01/28/19 152 lb (68.9 kg)  07/25/18 152 lb (68.9 kg)  06/29/18 152 lb (68.9 kg)   Ht Readings from Last 3 Encounters:  01/28/19 5\' 1"  (1.549 m)  07/25/18 5\' 1"  (1.549 m)  06/29/18 5' 0.5" (1.537 m)  CtCl 38 ; since > 30, she may remain on 100mg  allopurinal

## 2019-03-07 ENCOUNTER — Other Ambulatory Visit: Payer: Self-pay | Admitting: Family

## 2019-03-13 ENCOUNTER — Ambulatory Visit (INDEPENDENT_AMBULATORY_CARE_PROVIDER_SITE_OTHER): Payer: Medicare Other | Admitting: Family

## 2019-03-13 ENCOUNTER — Encounter: Payer: Self-pay | Admitting: Family

## 2019-03-13 ENCOUNTER — Other Ambulatory Visit: Payer: Self-pay

## 2019-03-13 ENCOUNTER — Other Ambulatory Visit: Payer: Self-pay | Admitting: Family

## 2019-03-13 VITALS — BP 137/61 | Ht 61.0 in | Wt 158.0 lb

## 2019-03-13 DIAGNOSIS — N183 Chronic kidney disease, stage 3 unspecified: Secondary | ICD-10-CM

## 2019-03-13 DIAGNOSIS — D649 Anemia, unspecified: Secondary | ICD-10-CM

## 2019-03-13 DIAGNOSIS — D631 Anemia in chronic kidney disease: Secondary | ICD-10-CM | POA: Insufficient documentation

## 2019-03-13 DIAGNOSIS — E1122 Type 2 diabetes mellitus with diabetic chronic kidney disease: Secondary | ICD-10-CM

## 2019-03-13 DIAGNOSIS — I10 Essential (primary) hypertension: Secondary | ICD-10-CM

## 2019-03-13 DIAGNOSIS — E785 Hyperlipidemia, unspecified: Secondary | ICD-10-CM

## 2019-03-13 DIAGNOSIS — E1169 Type 2 diabetes mellitus with other specified complication: Secondary | ICD-10-CM

## 2019-03-13 DIAGNOSIS — D472 Monoclonal gammopathy: Secondary | ICD-10-CM

## 2019-03-13 NOTE — Progress Notes (Signed)
Verbal consent for services obtained from patient prior to services given to TELEPHONE visit:   Location of call:  provider at work patient at home  Names of all persons present for services: Mable Paris, NP Chief complaint:   Follow up Feels well today. No complaints Has started walking Follow up labs  H/o DM- FBG 104, 93, 101, 102.   HTN- 137/61, 130/56, 106/70.  Denies exertional chest pain or pressure, numbness or tingling radiating to left arm or jaw, palpitations, dizziness, frequent headaches, changes in vision, or shortness of breath.   Nephrology-labs performed 01/30/2019. anemia.  Creatinine 1.25.  A1c 5.9 03/2019: Dr Holley Raring: Anemia of chronic disease.  Referred to hematology  History, background, results pertinent:   Colonoscopy 7 years ago; couple of polyps 'up to me if I ever repeated.' Bowel movements are the same.   A/P/next steps: Problem List Items Addressed This Visit      Cardiovascular and Mediastinum   Essential hypertension - Primary    At goal, continue current regimen        Endocrine   CKD stage 3 due to type 2 diabetes mellitus ([place])     No longer on medication for diabetes.  This is well controlled with diet alone.  We will continue to follow.      Hyperlipidemia associated with type 2 diabetes mellitus ([place])   Relevant Orders   Lipid panel   Uric acid     Other   Anemia    New (to me).  Reviewed labs done recommend nephrology, which showed anemia.  MCV on the low end.  Pending iron studies.  Patient is currently on over-the-counter iron.  Advised that she have stool cards performed.  Colonoscopy has been several years ago.  We agreed that if appeared to be iron deficient anemia, and she had negative occult cards and hemoglobin hematocrit responded to iron, we would not need to consult gastroenterology however occult was positive or she failed iron trial, will consult GI. F/u 3 months.  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       Relevant Orders   IBC + Ferritin   Fecal occult blood, imunochemical       I spent 20 min  discussing plan of care over the phone.

## 2019-03-13 NOTE — Progress Notes (Signed)
Spoke with patient & she stated that she had not seen hematology. She did not remember them calling her although she did know that she was being referred. I let her know that you may place this referral again for her.

## 2019-03-13 NOTE — Assessment & Plan Note (Signed)
  No longer on medication for diabetes.  This is well controlled with diet alone.  We will continue to follow.

## 2019-03-13 NOTE — Assessment & Plan Note (Signed)
At goal, continue current regimen 

## 2019-03-13 NOTE — Assessment & Plan Note (Addendum)
New (to me).  Reviewed labs done recommend nephrology, which showed anemia.  MCV on the low end.  Pending iron studies.  Patient is currently on over-the-counter iron.  Advised that she have stool cards performed.  Colonoscopy has been several years ago.  We agreed that if appeared to be iron deficient anemia, and she had negative occult cards and hemoglobin hematocrit responded to iron, we would not need to consult gastroenterology however occult was positive or she failed iron trial, will consult GI. F/u 3 months.  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

## 2019-03-14 NOTE — Progress Notes (Signed)
Patient called & informed. She had actually already received a call for an appointment.

## 2019-03-15 ENCOUNTER — Other Ambulatory Visit (INDEPENDENT_AMBULATORY_CARE_PROVIDER_SITE_OTHER): Payer: Medicare Other

## 2019-03-15 ENCOUNTER — Other Ambulatory Visit: Payer: Self-pay

## 2019-03-15 DIAGNOSIS — D649 Anemia, unspecified: Secondary | ICD-10-CM

## 2019-03-15 DIAGNOSIS — I10 Essential (primary) hypertension: Secondary | ICD-10-CM

## 2019-03-15 DIAGNOSIS — E1122 Type 2 diabetes mellitus with diabetic chronic kidney disease: Secondary | ICD-10-CM

## 2019-03-15 DIAGNOSIS — E785 Hyperlipidemia, unspecified: Secondary | ICD-10-CM

## 2019-03-15 DIAGNOSIS — E1169 Type 2 diabetes mellitus with other specified complication: Secondary | ICD-10-CM | POA: Diagnosis not present

## 2019-03-15 DIAGNOSIS — N183 Chronic kidney disease, stage 3 unspecified: Secondary | ICD-10-CM

## 2019-03-15 LAB — COMPREHENSIVE METABOLIC PANEL
ALT: 16 U/L (ref 0–35)
AST: 18 U/L (ref 0–37)
Albumin: 3.9 g/dL (ref 3.5–5.2)
Alkaline Phosphatase: 77 U/L (ref 39–117)
BUN: 24 mg/dL — ABNORMAL HIGH (ref 6–23)
CO2: 30 mEq/L (ref 19–32)
Calcium: 9.9 mg/dL (ref 8.4–10.5)
Chloride: 103 mEq/L (ref 96–112)
Creatinine, Ser: 1.16 mg/dL (ref 0.40–1.20)
GFR: 45.32 mL/min — ABNORMAL LOW (ref >60.00)
Glucose, Bld: 104 mg/dL — ABNORMAL HIGH (ref 70–99)
Potassium: 4.3 mEq/L (ref 3.5–5.1)
Sodium: 137 mEq/L (ref 135–145)
Total Bilirubin: 0.4 mg/dL (ref 0.2–1.2)
Total Protein: 7.6 g/dL (ref 6.0–8.3)

## 2019-03-15 LAB — LIPID PANEL
Cholesterol: 138 mg/dL (ref 0–200)
HDL: 45.9 mg/dL (ref >39.00)
LDL Cholesterol: 70 mg/dL (ref 0–99)
NonHDL: 92.4
Total CHOL/HDL Ratio: 3
Triglycerides: 113 mg/dL (ref 0.0–149.0)
VLDL: 22.6 mg/dL (ref 0.0–40.0)

## 2019-03-15 LAB — IBC + FERRITIN
Ferritin: 119.5 ng/mL (ref 10.0–291.0)
Iron: 63 ug/dL (ref 42–145)
Saturation Ratios: 23 % (ref 20.0–50.0)
Transferrin: 196 mg/dL — ABNORMAL LOW (ref 212.0–360.0)

## 2019-03-15 LAB — HEMOGLOBIN A1C: Hgb A1c MFr Bld: 5.9 % (ref 4.6–6.5)

## 2019-03-15 LAB — URIC ACID: Uric Acid, Serum: 5.3 mg/dL (ref 2.4–7.0)

## 2019-03-18 ENCOUNTER — Encounter: Payer: Self-pay | Admitting: Oncology

## 2019-03-18 ENCOUNTER — Telehealth: Payer: Self-pay | Admitting: Family

## 2019-03-18 NOTE — Progress Notes (Signed)
Patient contacted for visit on 2/16. Pt moved here from [place] about a year ago. Reports being anemic since she was 20. Pt also reports that she has had breast cancer in 2002 &2010. She had 2 sisters with breast cancer and father with lung cancer. Pt given directions to cancer center.

## 2019-03-18 NOTE — Telephone Encounter (Signed)
I tried to return patients call see result note.

## 2019-03-18 NOTE — Telephone Encounter (Signed)
Patient called and said she was returning phone call from office.

## 2019-03-19 ENCOUNTER — Other Ambulatory Visit (INDEPENDENT_AMBULATORY_CARE_PROVIDER_SITE_OTHER): Payer: Medicare Other

## 2019-03-19 ENCOUNTER — Other Ambulatory Visit: Payer: Self-pay | Admitting: Family

## 2019-03-19 ENCOUNTER — Other Ambulatory Visit: Payer: Self-pay

## 2019-03-19 ENCOUNTER — Inpatient Hospital Stay: Payer: Medicare Other | Attending: Oncology | Admitting: Oncology

## 2019-03-19 ENCOUNTER — Inpatient Hospital Stay: Payer: Medicare Other

## 2019-03-19 ENCOUNTER — Telehealth: Payer: Self-pay | Admitting: *Deleted

## 2019-03-19 VITALS — BP 183/81 | HR 90 | Temp 98.3°F | Wt 162.3 lb

## 2019-03-19 DIAGNOSIS — Z87891 Personal history of nicotine dependence: Secondary | ICD-10-CM | POA: Insufficient documentation

## 2019-03-19 DIAGNOSIS — R778 Other specified abnormalities of plasma proteins: Secondary | ICD-10-CM

## 2019-03-19 DIAGNOSIS — D631 Anemia in chronic kidney disease: Secondary | ICD-10-CM | POA: Diagnosis not present

## 2019-03-19 DIAGNOSIS — D649 Anemia, unspecified: Secondary | ICD-10-CM

## 2019-03-19 DIAGNOSIS — D509 Iron deficiency anemia, unspecified: Secondary | ICD-10-CM | POA: Diagnosis present

## 2019-03-19 DIAGNOSIS — Z853 Personal history of malignant neoplasm of breast: Secondary | ICD-10-CM | POA: Insufficient documentation

## 2019-03-19 DIAGNOSIS — Z923 Personal history of irradiation: Secondary | ICD-10-CM | POA: Diagnosis not present

## 2019-03-19 DIAGNOSIS — Z8249 Family history of ischemic heart disease and other diseases of the circulatory system: Secondary | ICD-10-CM | POA: Diagnosis not present

## 2019-03-19 DIAGNOSIS — I10 Essential (primary) hypertension: Secondary | ICD-10-CM | POA: Insufficient documentation

## 2019-03-19 DIAGNOSIS — E119 Type 2 diabetes mellitus without complications: Secondary | ICD-10-CM | POA: Diagnosis not present

## 2019-03-19 DIAGNOSIS — Z90710 Acquired absence of both cervix and uterus: Secondary | ICD-10-CM | POA: Diagnosis not present

## 2019-03-19 DIAGNOSIS — N1832 Chronic kidney disease, stage 3b: Secondary | ICD-10-CM | POA: Diagnosis not present

## 2019-03-19 DIAGNOSIS — Z803 Family history of malignant neoplasm of breast: Secondary | ICD-10-CM | POA: Diagnosis not present

## 2019-03-19 DIAGNOSIS — Z79899 Other long term (current) drug therapy: Secondary | ICD-10-CM | POA: Diagnosis not present

## 2019-03-19 DIAGNOSIS — Z170 Estrogen receptor positive status [ER+]: Secondary | ICD-10-CM | POA: Insufficient documentation

## 2019-03-19 DIAGNOSIS — Z801 Family history of malignant neoplasm of trachea, bronchus and lung: Secondary | ICD-10-CM

## 2019-03-19 LAB — CBC WITH DIFFERENTIAL/PLATELET
Abs Immature Granulocytes: 0.05 10*3/uL (ref 0.00–0.07)
Basophils Absolute: 0 10*3/uL (ref 0.0–0.1)
Basophils Relative: 1 %
Eosinophils Absolute: 0.2 10*3/uL (ref 0.0–0.5)
Eosinophils Relative: 3 %
HCT: 37.6 % (ref 36.0–46.0)
Hemoglobin: 11.3 g/dL — ABNORMAL LOW (ref 12.0–15.0)
Immature Granulocytes: 1 %
Lymphocytes Relative: 30 %
Lymphs Abs: 1.9 10*3/uL (ref 0.7–4.0)
MCH: 25 pg — ABNORMAL LOW (ref 26.0–34.0)
MCHC: 30.1 g/dL (ref 30.0–36.0)
MCV: 83.2 fL (ref 80.0–100.0)
Monocytes Absolute: 0.5 10*3/uL (ref 0.1–1.0)
Monocytes Relative: 8 %
Neutro Abs: 3.6 10*3/uL (ref 1.7–7.7)
Neutrophils Relative %: 57 %
Platelets: 203 10*3/uL (ref 150–400)
RBC: 4.52 MIL/uL (ref 3.87–5.11)
RDW: 14.6 % (ref 11.5–15.5)
WBC: 6.3 10*3/uL (ref 4.0–10.5)
nRBC: 0 % (ref 0.0–0.2)

## 2019-03-19 LAB — RETIC PANEL
Immature Retic Fract: 8.4 % (ref 2.3–15.9)
RBC.: 4.49 MIL/uL (ref 3.87–5.11)
Retic Count, Absolute: 64.7 10*3/uL (ref 19.0–186.0)
Retic Ct Pct: 1.4 % (ref 0.4–3.1)
Reticulocyte Hemoglobin: 28.6 pg (ref >27.9)

## 2019-03-19 LAB — FECAL OCCULT BLOOD, IMMUNOCHEMICAL: Fecal Occult Bld: POSITIVE — AB

## 2019-03-19 NOTE — Progress Notes (Signed)
Hematology/Oncology Consult note [facility] [phone] [phone]   Patient Care Team: Burnard Hawthorne, FNP as PCP - General (Family Medicine)  REFERRING PROVIDER: Burnard Hawthorne, FNP  CHIEF COMPLAINTS/REASON FOR VISIT:  Evaluation of abnormal SPEP  HISTORY OF PRESENTING ILLNESS:   Tracy Lawrence is a  79 y.o.  female with PMH listed below was seen in consultation at the request of  Burnard Hawthorne, FNP  for evaluation of abnormal SPEP. Reviewed patient's previous blood work via care everywhere. 08/27/2018, free light chain ratio 1.97, free kappa 79.5, free lambda 40.3. Protein electrophoresis showed increased alpha globulin.  #Patient moved from [place] to [place] last year. She reports history of right breast cancer in 2002 and left breast cancer in 2011.  She underwent right breast lumpectomy followed by adjuvant radiation followed by 5 years of tamoxifen.  Underwent left lumpectomy followed by adjuvant radiation followed by aromatase inhibitor for 5 years.  Currently she is not on any antiestrogen treatment.  Denies any chemotherapy treatment history.  Both breast cancer treatments were done in [place].  Today she reports feeling well at baseline.  Chronic fatigue, no exacerbation or alleviating factors.  Appetite is fair. Chronic history of kidney insufficiency, stage IIIb.  Patient sees Dr. Holley Raring.  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  Review of Systems  Constitutional: Positive for fatigue. Negative for appetite change, chills and fever.  HENT:   Negative for hearing loss and voice change.   Eyes: Negative for eye problems.  Respiratory: Negative for chest tightness and cough.   Cardiovascular: Negative for chest pain.  Gastrointestinal: Negative for abdominal distention, abdominal pain and blood in stool.  Endocrine: Negative for hot flashes.  Genitourinary: Negative for  difficulty urinating and frequency.   Musculoskeletal: Negative for arthralgias.  Skin: Negative for itching and rash.  Neurological: Negative for extremity weakness.  Hematological: Negative for adenopathy.  Psychiatric/Behavioral: Negative for confusion.    MEDICAL HISTORY:  Past Medical History:  Diagnosis Date  . Breast cancer ([place])   . Cancer ([place])   . Diabetes mellitus without complication ([facility])    prediabetes  . Hypertension     SURGICAL HISTORY: Past Surgical History:  Procedure Laterality Date  . ABDOMINAL HYSTERECTOMY  2018  . BREAST LUMPECTOMY Right 2002   breast ca with rad  . BREAST LUMPECTOMY Left 2011   breast ca with rad  . BREAST SURGERY  2011,2002  . CATARACT EXTRACTION  2015    SOCIAL HISTORY: Social History   Socioeconomic History  . Marital status: Married    Spouse name: Not on file  . Number of children: Not on file  . Years of education: Not on file  . Highest education level: Not on file  Occupational History  . Not on file  Tobacco Use  . Smoking status: Former Smoker    Packs/day: 1.00    Years: 12.00    Pack years: 12.00    Quit date: 03/18/1979    Years since quitting: 40.0  . Smokeless tobacco: Never Used  . Tobacco comment: quit 40 years ago  Substance and Sexual Activity  . Alcohol use: Yes    Comment: ocassionally  . Drug use: Never  . Sexual activity: Not on file  Other Topics Concern  . Not on file  Social History Narrative   From [place]      Retired      Social Determinants of [employer]  Resource Strain: Low Risk   . Difficulty of Paying Living Expenses: Not hard at all  Food Insecurity: No Food Insecurity  . Worried About Charity fundraiser in the Last Year: Never true  . Ran Out of Food in the Last Year: Never true  Transportation Needs: No Transportation Needs  . Lack of Transportation (Medical): No  . Lack of Transportation (Non-Medical): No  Physical Activity: Sufficiently Active  . Days of  Exercise per Week: 3 days  . Minutes of Exercise per Session: 50 min  Stress: No Stress Concern Present  . Feeling of Stress : Not at all  Social Connections:   . Frequency of Communication with Friends and Family: Not on file  . Frequency of Social Gatherings with Friends and Family: Not on file  . Attends Religious Services: Not on file  . Active Member of Clubs or Organizations: Not on file  . Attends Archivist Meetings: Not on file  . Marital Status: Not on file  Intimate Partner Violence: Not At Risk  . Fear of Current or Ex-Partner: No  . Emotionally Abused: No  . Physically Abused: No  . Sexually Abused: No    FAMILY HISTORY: Family History  Problem Relation Age of Onset  . Heart attack Mother   . Cancer Father   . Lung cancer Father   . Breast cancer Sister 58       dx twice age 52  . Breast cancer Sister   . Colon cancer Neg Hx     ALLERGIES:  has No Known Allergies.  MEDICATIONS:  Current Outpatient Medications  Medication Sig Dispense Refill  . allopurinol (ZYLOPRIM) 100 MG tablet Take 100 mg by mouth daily.    Marland Kitchen escitalopram (LEXAPRO) 10 MG tablet TAKE 1 TABLET BY MOUTH EVERY DAY FOR DEPRESSION 90 tablet 0  . losartan (COZAAR) 25 MG tablet Take 1 tablet (25 mg total) by mouth daily. 30 tablet 2  . losartan-hydrochlorothiazide (HYZAAR) 100-25 MG tablet Comments:   Filled Date: Aug 03 2018 12:00AM  Patient Notes: TK 1 T PO D FOR BP  Duration: 90    . rosuvastatin (CRESTOR) 40 MG tablet Take 40 mg by mouth daily.     No current facility-administered medications for this visit.     PHYSICAL EXAMINATION: ECOG PERFORMANCE STATUS: 0 - Asymptomatic Vitals:   03/19/19 1056  BP: (!) 183/81  Pulse: 90  Temp: 98.3 F (36.8 C)   Filed Weights   03/19/19 1056  Weight: 162 lb 4.8 oz (73.6 kg)    Physical Exam Constitutional:      General: She is not in acute distress. HENT:     Head: Normocephalic and atraumatic.  Eyes:     General: No  scleral icterus. Cardiovascular:     Rate and Rhythm: Normal rate and regular rhythm.     Heart sounds: Normal heart sounds.  Pulmonary:     Effort: Pulmonary effort is normal. No respiratory distress.     Breath sounds: No wheezing.  Abdominal:     General: Bowel sounds are normal. There is no distension.     Palpations: Abdomen is soft.  Musculoskeletal:        General: No deformity. Normal range of motion.     Cervical back: Normal range of motion and neck supple.  Skin:    General: Skin is warm and dry.     Findings: No erythema or rash.  Neurological:     Mental Status: She is  alert and oriented to person, place, and time. Mental status is at baseline.     Cranial Nerves: No cranial nerve deficit.     Coordination: Coordination normal.  Psychiatric:        Mood and Affect: Mood normal.   Breast exam was performed in seated and lying down position. Right breast lower inner quadrant lumpectomy scar with focal scar tissue. Left breast upper outer quadrant, 1:00 lumpectomy scar with palpable round thickened breast tissue. No palpable axillary lymphadenopathy.   LABORATORY DATA:  I have reviewed the data as listed Lab Results  Component Value Date   WBC 6.3 03/19/2019   HGB 11.3 (L) 03/19/2019   HCT 37.6 03/19/2019   MCV 83.2 03/19/2019   PLT 203 03/19/2019   Recent Labs    06/29/18 1335 07/04/18 1048 03/15/19 0943  NA 135 133* 137  K 3.9 4.0 4.3  CL 99 100 103  CO2 '28 26 30  ' GLUCOSE 118* 142* 104*  BUN 31* 21 24*  CREATININE 1.50* 1.37* 1.16  CALCIUM 9.9 9.8 9.9  PROT 7.4 7.2 7.6  ALBUMIN 4.1 3.9 3.9  AST '16 17 18  ' ALT '13 15 16  ' ALKPHOS 79 73 77  BILITOT 0.4 0.3 0.4   Iron/TIBC/Ferritin/ %Sat    Component Value Date/Time   IRON 63 03/15/2019 0943   FERRITIN 119.5 03/15/2019 0943   IRONPCTSAT 23.0 03/15/2019 0943      RADIOGRAPHIC STUDIES: I have personally reviewed the radiological images as listed and agreed with the findings in the report. No  results found.     ASSESSMENT & PLAN:  1. Abnormal SPEP   2. Microcytic anemia   3. Anemia due to stage 3b chronic kidney disease   4. History of breast cancer   5. Family history of breast cancer    #Abnormal SPEP, previous labs were evaluated and discussed with patient.  Not convinced that she has MGUS.  I recommend to repeat multiple myeloma panel and light chain ratio. Previous slightly elevated light chain ratio is very common in chronic kidney disease.  #Chronic microcytic anemia.  Hemoglobin around 11.  MCV chronically decreased around 78-79. Iron panel showed ferritin of 119.5, saturation ratio 23, iron 63, transferrin 196, not consistent with iron deficiency. I will check reticulocyte panel, hemoglobinopathy evaluation. Anemia can be also secondary to chronic kidney disease.  Discussed with patient.  CKD follow-up with nephrology.  Avoid nephrotoxins.  A1c 5.9, well controlled. #History of bilateral breast cancer.  Status post bilateral lumpectomy followed by adjuvant radiation and antiestrogen treatments. Currently not on any antiestrogen treatments.  I will obtain previous records. Last screening mammogram was done on 09/11/2018.  Images were independently reviewed by me Patient has stable postlumpectomy and postradiation changes involving both breasts.  No mammographic evidence of malignancy.  #Family history of breast cancer in first-degree relatives, patient reports that she was previously tested negative for BRCA gene mutation.  Results were not available. Orders Placed This Encounter  Procedures  . Multiple Myeloma Panel (SPEP&IFE w/QIG)    Standing Status:   Future    Number of Occurrences:   1    Standing Expiration Date:   03/18/2020  . Kappa/lambda light chains    Standing Status:   Future    Number of Occurrences:   1    Standing Expiration Date:   03/18/2020  . CBC with Differential/Platelet    Standing Status:   Future    Number of Occurrences:   1  Standing Expiration Date:   03/18/2020  . Retic Panel    Standing Status:   Future    Number of Occurrences:   1    Standing Expiration Date:   03/18/2020  . Hemoglobinopathy evaluation    Standing Status:   Future    Number of Occurrences:   1    Standing Expiration Date:   03/18/2020    All questions were answered. The patient knows to call the clinic with any problems questions or concerns.  cc Burnard Hawthorne, FNP    Return of visit: 2 weeks to discuss blood work. Thank you for this kind referral and the opportunity to participate in the care of this patient. A copy of today's note is routed to referring provider    Earlie Server, MD, PhD Hematology Oncology [facility] Pager- [phone] 03/19/2019

## 2019-03-19 NOTE — Telephone Encounter (Signed)
CRITICAL VALUE STICKER  CRITICAL VALUE: POSITIVE IFOB  RECEIVER (on-site recipient of call): Luiz Trumpower  DATE & TIME NOTIFIED: 03/19/19 @ 12:45pm  MESSENGER (representative from lab): LORI  MD NOTIFIED: Arnett, margaret  TIME OF NOTIFICATION: 1:01pm  RESPONSE: see lab results

## 2019-03-20 LAB — KAPPA/LAMBDA LIGHT CHAINS
Kappa free light chain: 65 mg/L — ABNORMAL HIGH (ref 3.3–19.4)
Kappa, lambda light chain ratio: 1.2 (ref 0.26–1.65)
Lambda free light chains: 54.3 mg/L — ABNORMAL HIGH (ref 5.7–26.3)

## 2019-03-21 LAB — MULTIPLE MYELOMA PANEL, SERUM
Albumin SerPl Elph-Mcnc: 4 g/dL (ref 2.9–4.4)
Albumin/Glob SerPl: 1.2 (ref 0.7–1.7)
Alpha 1: 0.2 g/dL (ref 0.0–0.4)
Alpha2 Glob SerPl Elph-Mcnc: 0.8 g/dL (ref 0.4–1.0)
B-Globulin SerPl Elph-Mcnc: 1 g/dL (ref 0.7–1.3)
Gamma Glob SerPl Elph-Mcnc: 1.5 g/dL (ref 0.4–1.8)
Globulin, Total: 3.5 g/dL (ref 2.2–3.9)
IgA: 230 mg/dL (ref 64–422)
IgG (Immunoglobin G), Serum: 1673 mg/dL — ABNORMAL HIGH (ref 586–1602)
IgM (Immunoglobulin M), Srm: 56 mg/dL (ref 26–217)
Total Protein ELP: 7.5 g/dL (ref 6.0–8.5)

## 2019-03-25 LAB — HEMOGLOBINOPATHY EVALUATION
Hgb A2 Quant: 2.2 % (ref 1.8–3.2)
Hgb A: 97.8 % (ref 96.4–98.8)
Hgb C: 0 %
Hgb F Quant: 0 % (ref 0.0–2.0)
Hgb S Quant: 0 %
Hgb Variant: 0 %

## 2019-04-02 ENCOUNTER — Other Ambulatory Visit: Payer: Self-pay

## 2019-04-02 ENCOUNTER — Encounter: Payer: Self-pay | Admitting: Oncology

## 2019-04-02 ENCOUNTER — Inpatient Hospital Stay: Payer: Medicare Other | Attending: Oncology | Admitting: Oncology

## 2019-04-02 VITALS — BP 164/83 | HR 73 | Temp 98.5°F | Resp 18 | Wt 162.2 lb

## 2019-04-02 DIAGNOSIS — Z79899 Other long term (current) drug therapy: Secondary | ICD-10-CM | POA: Diagnosis not present

## 2019-04-02 DIAGNOSIS — Z803 Family history of malignant neoplasm of breast: Secondary | ICD-10-CM | POA: Insufficient documentation

## 2019-04-02 DIAGNOSIS — Z8249 Family history of ischemic heart disease and other diseases of the circulatory system: Secondary | ICD-10-CM | POA: Diagnosis not present

## 2019-04-02 DIAGNOSIS — D631 Anemia in chronic kidney disease: Secondary | ICD-10-CM

## 2019-04-02 DIAGNOSIS — E119 Type 2 diabetes mellitus without complications: Secondary | ICD-10-CM | POA: Insufficient documentation

## 2019-04-02 DIAGNOSIS — Z801 Family history of malignant neoplasm of trachea, bronchus and lung: Secondary | ICD-10-CM | POA: Insufficient documentation

## 2019-04-02 DIAGNOSIS — Z853 Personal history of malignant neoplasm of breast: Secondary | ICD-10-CM | POA: Insufficient documentation

## 2019-04-02 DIAGNOSIS — R778 Other specified abnormalities of plasma proteins: Secondary | ICD-10-CM

## 2019-04-02 DIAGNOSIS — I10 Essential (primary) hypertension: Secondary | ICD-10-CM | POA: Diagnosis not present

## 2019-04-02 DIAGNOSIS — N1832 Chronic kidney disease, stage 3b: Secondary | ICD-10-CM | POA: Insufficient documentation

## 2019-04-02 DIAGNOSIS — Z90710 Acquired absence of both cervix and uterus: Secondary | ICD-10-CM | POA: Diagnosis not present

## 2019-04-02 DIAGNOSIS — Z87891 Personal history of nicotine dependence: Secondary | ICD-10-CM | POA: Diagnosis not present

## 2019-04-02 NOTE — Progress Notes (Signed)
Patient does not offer any problems today.  

## 2019-04-03 NOTE — Progress Notes (Signed)
Hematology/Oncology Consult note [name] Telephone:([phone] [phone]   Patient Care Team: Burnard Hawthorne, FNP as PCP - General (Family Medicine)  REFERRING PROVIDER: Burnard Hawthorne, FNP  CHIEF COMPLAINTS/REASON FOR VISIT:  Evaluation of abnormal SPEP  HISTORY OF PRESENTING ILLNESS:   Tracy Lawrence is a  79 y.o.  female with PMH listed below was seen in consultation at the request of  Burnard Hawthorne, FNP  for evaluation of abnormal SPEP. Reviewed patient's previous blood work via care everywhere. 08/27/2018, free light chain ratio 1.97, free kappa 79.5, free lambda 40.3. Protein electrophoresis showed increased alpha globulin.  #Patient moved from [place] to [place] last year. She reports history of right breast cancer in 2002 and left breast cancer in 2011.  She underwent right breast lumpectomy followed by adjuvant radiation followed by 5 years of tamoxifen.  Underwent left lumpectomy followed by adjuvant radiation followed by aromatase inhibitor for 5 years.  Currently she is not on any antiestrogen treatment.  Denies any chemotherapy treatment history.  Both breast cancer treatments were done in [place].  # Chronic history of kidney insufficiency, stage IIIb.  Patient sees Dr. Holley Raring.  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  # Breast exam was performed in seated and lying down position. Right breast lower inner quadrant lumpectomy scar with focal scar tissue. Left breast upper outer quadrant, 1:00 lumpectomy scar with palpable round thickened breast tissue. No palpable axillary lymphadenopathy.  # #Family history of breast cancer in first-degree relatives, patient reports that she was previously tested negative for BRCA gene mutation.  Results were not available.  #INTERVAL HISTORY Tracy Lawrence is a 79 y.o. female who has above history reviewed by me today presents for follow  up visit for abnormal SPEP. Problems and complaints are listed below: During interval, patient has had work-up done and presents for discussion and management plan. She has no new complaints today .   Review of Systems  Constitutional: Positive for fatigue. Negative for appetite change, chills and fever.  HENT:   Negative for hearing loss and voice change.   Eyes: Negative for eye problems.  Respiratory: Negative for chest tightness and cough.   Cardiovascular: Negative for chest pain.  Gastrointestinal: Negative for abdominal distention, abdominal pain and blood in stool.  Endocrine: Negative for hot flashes.  Genitourinary: Negative for difficulty urinating and frequency.   Musculoskeletal: Negative for arthralgias.  Skin: Negative for itching and rash.  Neurological: Negative for extremity weakness.  Hematological: Negative for adenopathy.  Psychiatric/Behavioral: Negative for confusion.    MEDICAL HISTORY:  Past Medical History:  Diagnosis Date  . Breast cancer ([place])   . Cancer ([place])   . Diabetes mellitus without complication (HCC)    prediabetes  . Hypertension     SURGICAL HISTORY: Past Surgical History:  Procedure Laterality Date  . ABDOMINAL HYSTERECTOMY  2018  . BREAST LUMPECTOMY Right 2002   breast ca with rad  . BREAST LUMPECTOMY Left 2011   breast ca with rad  . BREAST SURGERY  2011,2002  . CATARACT EXTRACTION  2015    SOCIAL HISTORY: Social History   Socioeconomic History  . Marital status: Married    Spouse name: Not on file  . Number of children: Not on file  . Years of education: Not on file  . Highest education level: Not on file  Occupational History  . Not on file  Tobacco Use  . Smoking status: Former Smoker  Packs/day: 1.00    Years: 12.00    Pack years: 12.00    Quit date: 03/18/1979    Years since quitting: 40.0  . Smokeless tobacco: Never Used  . Tobacco comment: quit 40 years ago  Substance and Sexual Activity  . Alcohol use:  Yes    Comment: ocassionally  . Drug use: Never  . Sexual activity: Not on file  Other Topics Concern  . Not on file  Social History Narrative   From [place]      Retired      Social Determinants of Radio broadcast assistant Strain: [place]   . Difficulty of Paying Living Expenses: Not hard at all  Food Insecurity: No Food Insecurity  . Worried About Charity fundraiser in the Last Year: Never true  . Ran Out of Food in the Last Year: Never true  Transportation Needs: No Transportation Needs  . Lack of Transportation (Medical): No  . Lack of Transportation (Non-Medical): No  Physical Activity: Sufficiently Active  . Days of Exercise per Week: 3 days  . Minutes of Exercise per Session: 50 min  Stress: No Stress Concern Present  . Feeling of Stress : Not at all  Social Connections:   . Frequency of Communication with Friends and Family: Not on file  . Frequency of Social Gatherings with Friends and Family: Not on file  . Attends Religious Services: Not on file  . Active Member of Clubs or Organizations: Not on file  . Attends Archivist Meetings: Not on file  . Marital Status: Not on file  Intimate Partner Violence: Not At Risk  . Fear of Current or Ex-Partner: No  . Emotionally Abused: No  . Physically Abused: No  . Sexually Abused: No    FAMILY HISTORY: Family History  Problem Relation Age of Onset  . Heart attack Mother   . Cancer Father   . Lung cancer Father   . Breast cancer Sister 75       dx twice age 69  . Breast cancer Sister   . Colon cancer Neg Hx     ALLERGIES:  has No Known Allergies.  MEDICATIONS:  Current Outpatient Medications  Medication Sig Dispense Refill  . allopurinol (ZYLOPRIM) 100 MG tablet Take 100 mg by mouth daily.    Marland Kitchen escitalopram (LEXAPRO) 10 MG tablet TAKE 1 TABLET BY MOUTH EVERY DAY FOR DEPRESSION 90 tablet 0  . losartan (COZAAR) 25 MG tablet Take 1 tablet (25 mg total) by mouth daily. 30 tablet 2  . Multiple  Vitamins-Minerals (EYE VITAMINS PO) Take by mouth.    . rosuvastatin (CRESTOR) 40 MG tablet Take 40 mg by mouth daily.     No current facility-administered medications for this visit.     PHYSICAL EXAMINATION: ECOG PERFORMANCE STATUS: 0 - Asymptomatic Vitals:   04/02/19 1326  BP: (!) 164/83  Pulse: 73  Resp: 18  Temp: 98.5 F (36.9 C)   Filed Weights   04/02/19 1326  Weight: 162 lb 3.2 oz (73.6 kg)    Physical Exam Constitutional:      General: She is not in acute distress. HENT:     Head: Normocephalic and atraumatic.  Eyes:     General: No scleral icterus. Cardiovascular:     Rate and Rhythm: Normal rate and regular rhythm.     Heart sounds: Normal heart sounds.  Pulmonary:     Effort: Pulmonary effort is normal. No respiratory distress.     Breath  sounds: No wheezing.  Abdominal:     General: Bowel sounds are normal. There is no distension.     Palpations: Abdomen is soft.  Musculoskeletal:        General: No deformity. Normal range of motion.     Cervical back: Normal range of motion and neck supple.  Skin:    General: Skin is warm and dry.     Findings: No erythema or rash.  Neurological:     Mental Status: She is alert and oriented to person, place, and time. Mental status is at baseline.     Cranial Nerves: No cranial nerve deficit.     Coordination: Coordination normal.  Psychiatric:        Mood and Affect: Mood normal.    LABORATORY DATA:  I have reviewed the data as listed Lab Results  Component Value Date   WBC 6.3 03/19/2019   HGB 11.3 (L) 03/19/2019   HCT 37.6 03/19/2019   MCV 83.2 03/19/2019   PLT 203 03/19/2019   Recent Labs    06/29/18 1335 07/04/18 1048 03/15/19 0943  NA 135 133* 137  K 3.9 4.0 4.3  CL 99 100 103  CO2 '28 26 30  ' GLUCOSE 118* 142* 104*  BUN 31* 21 24*  CREATININE 1.50* 1.37* 1.16  CALCIUM 9.9 9.8 9.9  PROT 7.4 7.2 7.6  ALBUMIN 4.1 3.9 3.9  AST '16 17 18  ' ALT '13 15 16  ' ALKPHOS 79 73 77  BILITOT 0.4 0.3 0.4     Iron/TIBC/Ferritin/ %Sat    Component Value Date/Time   IRON 63 03/15/2019 0943   FERRITIN 119.5 03/15/2019 0943   IRONPCTSAT 23.0 03/15/2019 0943      RADIOGRAPHIC STUDIES: I have personally reviewed the radiological images as listed and agreed with the findings in the report. No results found.     ASSESSMENT & PLAN:  1. Abnormal SPEP   2. Anemia due to stage 3b chronic kidney disease   3. History of breast cancer    #Abnormal SPEP,  Labs reviewed and discussed with patient. IgG elevated 1673, no monoclonal protein.  Immunofixation showed polyclonal increase.  Likely reactive process.  Light chain ratio is normal. Results were discussed with patient.  No need for additional work-up at this point.  #Chronic microcytic anemia, iron panel is not consistent with iron deficiency. Normal hemoglobin evaluation.  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 She has positive stool occult.  Patient has been referred to establish care with gastroenterology by primary care provider. Anemia can be also secondary to CKD. #Chronic kidney disease, avoid nephrotoxins. #History of bilateral breast cancer.  Status post bilateral lumpectomy followed by adjuvant radiation and antiestrogen treatments. Obtained previous records from [facility] Records were reviewed.  No pathology is available. 06/06/2017 screening mammogram showed postoperative changes in both breasts.  Architectural distortion has not changed significantly.  Skin thickening is similar.  Calcifications have increased mildly around the postoperative sites bilaterally.  No new dominant mass.    Orders Placed This Encounter  Procedures  . CBC with Differential/Platelet    Standing Status:   Future    Standing Expiration Date:   10/02/2020  . Ferritin    Standing Status:   Future    Standing Expiration Date:   10/02/2020  . Iron and TIBC    Standing Status:   Future    Standing Expiration  Date:   10/02/2020  . Comprehensive metabolic panel    Standing Status:  Future    Standing Expiration Date:   10/02/2020    All questions were answered. The patient knows to call the clinic with any problems questions or concerns.   Return of visit: 6 months. Thank you for this kind referral and the opportunity to participate in the care of this patient. A copy of today's note is routed to referring provider    Earlie Server, MD, PhD Hematology Oncology [facility] Pager- [phone] 04/03/2019

## 2019-05-10 ENCOUNTER — Other Ambulatory Visit: Payer: Self-pay | Admitting: Family

## 2019-05-11 ENCOUNTER — Other Ambulatory Visit: Payer: Self-pay | Admitting: Family

## 2019-05-13 ENCOUNTER — Other Ambulatory Visit: Payer: Self-pay | Admitting: Family

## 2019-05-28 ENCOUNTER — Other Ambulatory Visit: Payer: Self-pay

## 2019-05-28 ENCOUNTER — Encounter: Payer: Self-pay | Admitting: Gastroenterology

## 2019-05-28 ENCOUNTER — Ambulatory Visit (INDEPENDENT_AMBULATORY_CARE_PROVIDER_SITE_OTHER): Payer: Medicare Other | Admitting: Gastroenterology

## 2019-05-28 VITALS — BP 176/88 | HR 77 | Temp 97.8°F | Ht 61.0 in | Wt 157.2 lb

## 2019-05-28 DIAGNOSIS — D649 Anemia, unspecified: Secondary | ICD-10-CM

## 2019-05-28 DIAGNOSIS — E538 Deficiency of other specified B group vitamins: Secondary | ICD-10-CM | POA: Diagnosis not present

## 2019-05-28 DIAGNOSIS — R195 Other fecal abnormalities: Secondary | ICD-10-CM

## 2019-05-28 MED ORDER — NA SULFATE-K SULFATE-MG SULF 17.5-3.13-1.6 GM/177ML PO SOLN: 354.0000 mL | Freq: Once | ORAL | 0 refills | Status: AC

## 2019-05-28 NOTE — Progress Notes (Signed)
Cephas Darby, MD [address]  [place]  [place], [place] [address]  Main: [phone]  Fax: [phone]    Gastroenterology Consultation  Referring Provider:     Burnard Hawthorne, FNP Primary Care Physician:  Burnard Hawthorne, FNP Primary Gastroenterologist:  Dr. Cephas Darby Reason for Consultation:     Normocytic anemia, stool occult positive        HPI:   Tracy Lawrence is a 79 y.o. female referred by Dr. Burnard Hawthorne, FNP  for consultation & management of normocytic anemia, stool occult positive.  Patient has mild anemia with no evidence of iron deficiency. Her hemoglobin was 11.8 in 06/2018 with MCV 77.6 suggestive of iron deficiency.  Most recently her hemoglobin was 11.3, MCV 83.2 and iron studies were normal. She also has mild chronic kidney disease  Patient does not smoke or drink alcohol Patient has moved from [place] to [place] over 1 year ago, moved closer to her daughter and grandchildren  NSAIDs: None  Antiplts/Anticoagulants/Anti thrombotics: None  GI Procedures: Reports having had colonoscopy more than 7 years ago  Past Medical History:  Diagnosis Date  . Breast cancer ([place])   . Cancer ([place])   . Diabetes mellitus without complication (HCC)    prediabetes  . Hypertension     Past Surgical History:  Procedure Laterality Date  . ABDOMINAL HYSTERECTOMY  2018  . BREAST LUMPECTOMY Right 2002   breast ca with rad  . BREAST LUMPECTOMY Left 2011   breast ca with rad  . BREAST SURGERY  2011,2002  . CATARACT EXTRACTION  2015    Current Outpatient Medications:  .  allopurinol (ZYLOPRIM) 100 MG tablet, Take 100 mg by mouth daily., Disp: , Rfl:  .  escitalopram (LEXAPRO) 10 MG tablet, TAKE 1 TABLET BY MOUTH EVERY DAY FOR DEPRESSION, Disp: 90 tablet, Rfl: 0 .  losartan (COZAAR) 25 MG tablet, TAKE 1 TABLET(25 MG) BY MOUTH DAILY, Disp: 30 tablet, Rfl: 2 .  Multiple Vitamins-Minerals (EYE VITAMINS PO), Take by mouth., Disp: , Rfl:    .  rosuvastatin (CRESTOR) 40 MG tablet, TAKE 1 TABLET BY MOUTH EVERY DAY FOR CHOLESTEROL, Disp: 90 tablet, Rfl: 1 .  Na Sulfate-K Sulfate-Mg Sulf 17.5-3.13-1.6 GM/177ML SOLN, Take 354 mLs by mouth once for 1 dose., Disp: 354 mL, Rfl: 0    Family History  Problem Relation Age of Onset  . Heart attack Mother   . Cancer Father   . Lung cancer Father   . Breast cancer Sister 64       dx twice age 46  . Breast cancer Sister   . Colon cancer Neg Hx      Social History   Tobacco Use  . Smoking status: Former Smoker    Packs/day: 1.00    Years: 12.00    Pack years: 12.00    Quit date: 03/18/1979    Years since quitting: 40.2  . Smokeless tobacco: Never Used  . Tobacco comment: quit 40 years ago  Substance Use Topics  . Alcohol use: Yes    Comment: rarely  . Drug use: Never    Allergies as of 05/28/2019  . (No Known Allergies)    Review of Systems:    All systems reviewed and negative except where noted in HPI.   Physical Exam:  BP (!) 176/88   Pulse 77   Temp 97.8 F (36.6 C) (Oral)   Ht 5\' 1"  (1.549 m)   Wt 157 lb 3.2 oz (  71.3 kg)   BMI 29.70 kg/m  No LMP recorded. Patient has had a hysterectomy.  General:   Alert,  Well-developed, well-nourished, pleasant and cooperative in NAD Head:  Normocephalic and atraumatic. Eyes:  Sclera clear, no icterus.   Conjunctiva pink. Ears:  Normal auditory acuity. Nose:  No deformity, discharge, or lesions. Mouth:  No deformity or lesions,oropharynx pink & moist. Neck:  Supple; no masses or thyromegaly. Lungs:  Respirations even and unlabored.  Clear throughout to auscultation.   No wheezes, crackles, or rhonchi. No acute distress. Heart:  Regular rate and rhythm; no murmurs, clicks, rubs, or gallops. Abdomen:  Normal bowel sounds. Soft, non-tender and non-distended without masses, hepatosplenomegaly or hernias noted.  No guarding or rebound tenderness.   Rectal: Not performed Msk:  Symmetrical without gross deformities. Good,  equal movement & strength bilaterally. Pulses:  Normal pulses noted. Extremities:  No clubbing or edema.  No cyanosis. Neurologic:  Alert and oriented x3;  grossly normal neurologically. Skin:  Intact without significant lesions or rashes. No jaundice. Psych:  Alert and cooperative. Normal mood and affect.  Imaging Studies: Reviewed  Assessment and Plan:   Tracy Lawrence is a 79 y.o. female with hypertension, hyperlipidemia, stage III CKD is seen in consultation for normocytic anemia, stool occult positive  Check B12 and folate panel Recommend EGD evaluate for any structural lesions that could be anemia Recommend colonoscopy given stool occult positive   Follow up based on the above work-up   Cephas Darby, MD

## 2019-05-29 LAB — B12 AND FOLATE PANEL
Folate: 18.2 ng/mL (ref >3.0)
Vitamin B-12: 430 pg/mL (ref 232–1245)

## 2019-06-10 ENCOUNTER — Ambulatory Visit: Payer: Medicare Other | Admitting: Family

## 2019-06-12 ENCOUNTER — Other Ambulatory Visit
Admission: RE | Admit: 2019-06-12 | Discharge: 2019-06-12 | Disposition: A | Discharge status: Home / Self Care | Payer: Medicare Other | Source: Ambulatory Visit | Attending: Gastroenterology | Admitting: Gastroenterology

## 2019-06-12 ENCOUNTER — Telehealth: Payer: Self-pay | Admitting: Family

## 2019-06-12 ENCOUNTER — Other Ambulatory Visit: Payer: Self-pay

## 2019-06-12 ENCOUNTER — Ambulatory Visit (INDEPENDENT_AMBULATORY_CARE_PROVIDER_SITE_OTHER): Payer: Medicare Other | Admitting: Family

## 2019-06-12 ENCOUNTER — Encounter: Payer: Self-pay | Admitting: Family

## 2019-06-12 DIAGNOSIS — D649 Anemia, unspecified: Secondary | ICD-10-CM

## 2019-06-12 DIAGNOSIS — I10 Essential (primary) hypertension: Secondary | ICD-10-CM | POA: Diagnosis not present

## 2019-06-12 DIAGNOSIS — F32A Depression, unspecified: Secondary | ICD-10-CM

## 2019-06-12 DIAGNOSIS — F329 Major depressive disorder, single episode, unspecified: Secondary | ICD-10-CM

## 2019-06-12 DIAGNOSIS — Z01812 Encounter for preprocedural laboratory examination: Secondary | ICD-10-CM | POA: Insufficient documentation

## 2019-06-12 DIAGNOSIS — Z20822 Contact with and (suspected) exposure to covid-19: Secondary | ICD-10-CM | POA: Insufficient documentation

## 2019-06-12 DIAGNOSIS — D472 Monoclonal gammopathy: Secondary | ICD-10-CM

## 2019-06-12 LAB — SARS CORONAVIRUS 2 (TAT 6-24 HRS): SARS Coronavirus 2: NEGATIVE

## 2019-06-12 NOTE — Progress Notes (Signed)
Subjective:    Patient ID: Tracy Lawrence, female    DOB: 01-18-1941, 79 y.o.   MRN: [id number]  CC: Tracy Lawrence is a 79 y.o. female who presents today for follow up.   HPI: Feels well today. No complaints.   HTN- BP at home 132/74.compliant with medication.  Denies exertional chest pain or pressure, numbness or tingling radiating to left arm or jaw, palpitations, dizziness, frequent headaches, changes in vision, or shortness of breath.   Depression- feels well on lexapro. No trouble sleeping. No si/hi    colonoscopy and EGD 06/14/2019 H/o abnormal SPEP - Dr Tracy Lawrence CKD and anemia of chronic disease- dr Holley Raring  MM UTD.    HISTORY:  Past Medical History:  Diagnosis Date  . Breast cancer ([place])   . Cancer ([place])   . Diabetes mellitus without complication (HCC)    prediabetes  . Hypertension    Past Surgical History:  Procedure Laterality Date  . ABDOMINAL HYSTERECTOMY  2018  . BREAST LUMPECTOMY Right 2002   breast ca with rad  . BREAST LUMPECTOMY Left 2011   breast ca with rad  . BREAST SURGERY  2011,2002  . CATARACT EXTRACTION  2015   Family History  Problem Relation Age of Onset  . Heart attack Mother   . Cancer Father   . Lung cancer Father   . Breast cancer Sister 74       dx twice age 63  . Non-Hodgkin's lymphoma Sister   . Dementia Sister 30  . Colon cancer Neg Hx     Allergies: Patient has no known allergies. Current Outpatient Medications on File Prior to Visit  Medication Sig Dispense Refill  . allopurinol (ZYLOPRIM) 100 MG tablet Take 100 mg by mouth daily.    Marland Kitchen escitalopram (LEXAPRO) 10 MG tablet TAKE 1 TABLET BY MOUTH EVERY DAY FOR DEPRESSION 90 tablet 0  . losartan (COZAAR) 25 MG tablet TAKE 1 TABLET(25 MG) BY MOUTH DAILY 30 tablet 2  . Multiple Vitamins-Minerals (EYE VITAMINS PO) Take by mouth.    . rosuvastatin (CRESTOR) 40 MG tablet TAKE 1 TABLET BY MOUTH EVERY DAY FOR CHOLESTEROL 90 tablet 1   No current facility-administered medications on  file prior to visit.    Social History   Tobacco Use  . Smoking status: Former Smoker    Packs/day: 1.00    Years: 12.00    Pack years: 12.00    Quit date: 03/18/1979    Years since quitting: 40.2  . Smokeless tobacco: Never Used  . Tobacco comment: quit 40 years ago  Substance Use Topics  . Alcohol use: Yes    Comment: rarely  . Drug use: Never    Review of Systems  Constitutional: Negative for chills and fever.  Respiratory: Negative for cough.   Cardiovascular: Negative for chest pain and palpitations.  Gastrointestinal: Negative for nausea and vomiting.      Objective:    BP 140/72   Pulse 83   Temp (!) 96.3 F (35.7 C) (Temporal)   Ht 5\' 1"  (1.549 m)   Wt 162 lb (73.5 kg)   SpO2 96%   BMI 30.61 kg/m  BP Readings from Last 3 Encounters:  06/12/19 140/72  05/28/19 (!) 176/88  04/02/19 (!) 164/83   Wt Readings from Last 3 Encounters:  06/12/19 162 lb (73.5 kg)  05/28/19 157 lb 3.2 oz (71.3 kg)  04/02/19 162 lb 3.2 oz (73.6 kg)    Physical Exam Vitals reviewed.  Constitutional:  Appearance: She is well-developed.  Eyes:     Conjunctiva/sclera: Conjunctivae normal.  Cardiovascular:     Rate and Rhythm: Normal rate and regular rhythm.     Pulses: Normal pulses.     Heart sounds: Normal heart sounds.  Pulmonary:     Effort: Pulmonary effort is normal.     Breath sounds: Normal breath sounds. No wheezing, rhonchi or rales.  Skin:    General: Skin is warm and dry.  Neurological:     Mental Status: She is alert.  Psychiatric:        Speech: Speech normal.        Behavior: Behavior normal.        Thought Content: Thought content normal.        Assessment & Plan:   Problem List Items Addressed This Visit      Cardiovascular and Mediastinum   Essential hypertension    Stable. Continue regimen.        Other   Anemia    Has had consult with Tracy Lawrence; pending EGD, colonoscopy in 2 days. Discussed if GI work up normal, working diagnosis  includes anemia of chronic disease.       Depressive disorder    Controlled. Continue lexapro.      Monoclonal gammopathy       I am having Tracy Lawrence maintain her allopurinol, Multiple Vitamins-Minerals (EYE VITAMINS PO), losartan, rosuvastatin, and escitalopram.   No orders of the defined types were placed in this encounter.   Return precautions given.   Risks, benefits, and alternatives of the medications and treatment plan prescribed today were discussed, and patient expressed understanding.   Education regarding symptom management and diagnosis given to patient on AVS.  Continue to follow with Burnard Hawthorne, FNP for routine health maintenance.   Tracy Lawrence and I agreed with plan.   Tracy Paris, FNP

## 2019-06-12 NOTE — Assessment & Plan Note (Signed)
Has had consult with [place]; pending EGD, colonoscopy in 2 days. Discussed if GI work up normal, working diagnosis includes anemia of chronic disease.

## 2019-06-12 NOTE — Assessment & Plan Note (Signed)
Controlled. Continue lexapro.

## 2019-06-12 NOTE — Telephone Encounter (Signed)
Pt called in and said her doctor in [place] was Dr. Dellia Beckwith and their fax number is [phone].

## 2019-06-12 NOTE — Telephone Encounter (Signed)
I have faxed.  

## 2019-06-12 NOTE — Assessment & Plan Note (Signed)
Stable. Continue regimen. 

## 2019-06-12 NOTE — Patient Instructions (Signed)
It is imperative that you are seen AT least twice per year for labs and monitoring. Monitor blood pressure at home and me 5-6 reading on separate days. Goal is less than 120/80, based on newest guidelines, however we certainly want to be less than 130/80;  if persistently higher, please make sooner follow up appointment so we can recheck you blood pressure and manage/ adjust medications.  Start walking again :)     Stay safe!

## 2019-06-13 ENCOUNTER — Encounter: Payer: Self-pay | Admitting: Gastroenterology

## 2019-06-14 ENCOUNTER — Encounter: Payer: Self-pay | Admitting: Gastroenterology

## 2019-06-14 ENCOUNTER — Ambulatory Visit
Admission: RE | Admit: 2019-06-14 | Discharge: 2019-06-14 | Disposition: A | Discharge status: Home / Self Care | Payer: Medicare Other | Attending: Gastroenterology | Admitting: Gastroenterology

## 2019-06-14 ENCOUNTER — Encounter
Admission: RE | Disposition: A | Discharge status: Home / Self Care | Payer: Self-pay | Source: Home / Self Care | Attending: Gastroenterology

## 2019-06-14 ENCOUNTER — Ambulatory Visit: Payer: Medicare Other | Admitting: Anesthesiology

## 2019-06-14 ENCOUNTER — Other Ambulatory Visit: Payer: Self-pay

## 2019-06-14 DIAGNOSIS — D122 Benign neoplasm of ascending colon: Secondary | ICD-10-CM | POA: Insufficient documentation

## 2019-06-14 DIAGNOSIS — I10 Essential (primary) hypertension: Secondary | ICD-10-CM | POA: Diagnosis not present

## 2019-06-14 DIAGNOSIS — Z87891 Personal history of nicotine dependence: Secondary | ICD-10-CM | POA: Insufficient documentation

## 2019-06-14 DIAGNOSIS — D123 Benign neoplasm of transverse colon: Secondary | ICD-10-CM | POA: Diagnosis not present

## 2019-06-14 DIAGNOSIS — K635 Polyp of colon: Secondary | ICD-10-CM

## 2019-06-14 DIAGNOSIS — R195 Other fecal abnormalities: Secondary | ICD-10-CM | POA: Diagnosis present

## 2019-06-14 DIAGNOSIS — R7303 Prediabetes: Secondary | ICD-10-CM | POA: Insufficient documentation

## 2019-06-14 DIAGNOSIS — K5730 Diverticulosis of large intestine without perforation or abscess without bleeding: Secondary | ICD-10-CM | POA: Diagnosis not present

## 2019-06-14 DIAGNOSIS — Z8249 Family history of ischemic heart disease and other diseases of the circulatory system: Secondary | ICD-10-CM | POA: Insufficient documentation

## 2019-06-14 DIAGNOSIS — D509 Iron deficiency anemia, unspecified: Secondary | ICD-10-CM | POA: Insufficient documentation

## 2019-06-14 DIAGNOSIS — Z79899 Other long term (current) drug therapy: Secondary | ICD-10-CM | POA: Insufficient documentation

## 2019-06-14 DIAGNOSIS — D125 Benign neoplasm of sigmoid colon: Secondary | ICD-10-CM | POA: Insufficient documentation

## 2019-06-14 DIAGNOSIS — D649 Anemia, unspecified: Secondary | ICD-10-CM

## 2019-06-14 DIAGNOSIS — K3189 Other diseases of stomach and duodenum: Secondary | ICD-10-CM | POA: Insufficient documentation

## 2019-06-14 HISTORY — PX: COLONOSCOPY WITH PROPOFOL: SHX5780

## 2019-06-14 HISTORY — PX: ESOPHAGOGASTRODUODENOSCOPY (EGD) WITH PROPOFOL: SHX5813

## 2019-06-14 HISTORY — DX: Anemia, unspecified: D64.9

## 2019-06-14 HISTORY — DX: Prediabetes: R73.03

## 2019-06-14 SURGERY — COLONOSCOPY WITH PROPOFOL
Anesthesia: General

## 2019-06-14 MED ORDER — PROPOFOL 500 MG/50ML IV EMUL
INTRAVENOUS | Status: AC
  Filled 2019-06-14: qty 50

## 2019-06-14 MED ORDER — SODIUM CHLORIDE 0.9 % IV SOLN: INTRAVENOUS | Status: DC

## 2019-06-14 MED ORDER — BUTAMBEN-TETRACAINE-BENZOCAINE 2-2-14 % EX AERO
INHALATION_SPRAY | CUTANEOUS | Status: AC
  Filled 2019-06-14: qty 5

## 2019-06-14 MED ORDER — PROPOFOL 500 MG/50ML IV EMUL
INTRAVENOUS | Status: DC | PRN
  Administered 2019-06-14: 120 ug/kg/min via INTRAVENOUS

## 2019-06-14 MED ORDER — PROPOFOL 10 MG/ML IV BOLUS
INTRAVENOUS | Status: AC
  Filled 2019-06-14: qty 20

## 2019-06-14 NOTE — Transfer of Care (Signed)
Immediate Anesthesia Transfer of Care Note  Patient: Tracy Lawrence  Procedure(s) Performed: COLONOSCOPY WITH PROPOFOL (N/A ) ESOPHAGOGASTRODUODENOSCOPY (EGD) WITH PROPOFOL (N/A )  Patient Location: PACU  Anesthesia Type:General  Level of Consciousness: awake and sedated  Airway & Oxygen Therapy: Patient Spontanous Breathing and Patient connected to nasal cannula oxygen  Post-op Assessment: Report given to RN and Post -op Vital signs reviewed and stable  Post vital signs: Reviewed and stable  Last Vitals:  Vitals Value Taken Time  BP    Temp    Pulse    Resp    SpO2      Last Pain:  Vitals:   06/14/19 0926  TempSrc: Temporal  PainSc: 0-No pain         Complications: No apparent anesthesia complications

## 2019-06-14 NOTE — Anesthesia Preprocedure Evaluation (Signed)
Anesthesia Evaluation  Patient identified by MRN, date of birth, ID band Patient awake    Reviewed: Allergy & Precautions, H&P , NPO status , Patient's Chart, lab work & pertinent test results, reviewed documented beta blocker date and time   History of Anesthesia Complications Negative for: history of anesthetic complications  Airway Mallampati: II  TM Distance: >3 FB Neck ROM: full    Dental  (+) Dental Advidsory Given, Caps, Teeth Intact, Missing Permanent bridge on bottom right:   Pulmonary neg pulmonary ROS, former smoker,    Pulmonary exam normal breath sounds clear to auscultation       Cardiovascular Exercise Tolerance: Good hypertension, (-) angina(-) Past MI and (-) Cardiac Stents Normal cardiovascular exam(-) dysrhythmias (-) Valvular Problems/Murmurs Rhythm:regular Rate:Normal     Neuro/Psych PSYCHIATRIC DISORDERS Depression negative neurological ROS     GI/Hepatic negative GI ROS, Neg liver ROS,   Endo/Other  diabetes (Pre-diabetic)  Renal/GU CRFRenal disease  negative genitourinary   Musculoskeletal   Abdominal   Peds  Hematology negative hematology ROS (+)   Anesthesia Other Findings Past Medical History: No date: Anemia No date: Breast cancer (HCC) No date: Cancer ([place]) No date: Hypertension No date: Pre-diabetes   Reproductive/Obstetrics negative OB ROS                             Anesthesia Physical Anesthesia Plan  ASA: II  Anesthesia Plan: General   Post-op Pain Management:    Induction: Intravenous  PONV Risk Score and Plan: 3 and Propofol infusion and TIVA  Airway Management Planned: Natural Airway and Nasal Cannula  Additional Equipment:   Intra-op Plan:   Post-operative Plan:   Informed Consent: I have reviewed the patients History and Physical, chart, labs and discussed the procedure including the risks, benefits and alternatives for the  proposed anesthesia with the patient or authorized representative who has indicated his/her understanding and acceptance.     Dental Advisory Given  Plan Discussed with: Anesthesiologist, CRNA and Surgeon  Anesthesia Plan Comments:         Anesthesia Quick Evaluation

## 2019-06-14 NOTE — H&P (Signed)
Tracy Darby, MD [address]  [place]  [place], [place] [address]  Main: [phone]  Fax: [phone] Pager: [phone]  Primary Care Physician:  Burnard Hawthorne, FNP Primary Gastroenterologist:  Dr. Cephas Lawrence  Pre-Procedure History & Physical: HPI:  Tracy Lawrence is a 79 y.o. female is here for an endoscopy and colonoscopy.   Past Medical History:  Diagnosis Date   Anemia    Breast cancer ([place])    Cancer ([place])    Hypertension    Pre-diabetes     Past Surgical History:  Procedure Laterality Date   ABDOMINAL HYSTERECTOMY  2018   BREAST LUMPECTOMY Right 2002   breast ca with rad   BREAST LUMPECTOMY Left 2011   breast ca with rad   BREAST SURGERY  2011,2002   CATARACT EXTRACTION  2015   COLONOSCOPY      Prior to Admission medications   Medication Sig Start Date End Date Taking? Authorizing Provider  allopurinol (ZYLOPRIM) 100 MG tablet Take 100 mg by mouth daily.    [provider]  escitalopram (LEXAPRO) 10 MG tablet TAKE 1 TABLET BY MOUTH EVERY DAY FOR DEPRESSION 05/13/19   Burnard Hawthorne, FNP  losartan (COZAAR) 25 MG tablet TAKE 1 TABLET(25 MG) BY MOUTH DAILY 05/10/19   Burnard Hawthorne, FNP  Multiple Vitamins-Minerals (EYE VITAMINS PO) Take by mouth.    [provider]  rosuvastatin (CRESTOR) 40 MG tablet TAKE 1 TABLET BY MOUTH EVERY DAY FOR CHOLESTEROL 05/13/19   Burnard Hawthorne, FNP    Allergies as of 05/28/2019   (No Known Allergies)    Family History  Problem Relation Age of Onset   Heart attack Mother    Cancer Father    Lung cancer Father    Breast cancer Sister 50       dx twice age 46   Non-Hodgkin's lymphoma Sister    Dementia Sister 3   Colon cancer Neg Hx     Social History   Socioeconomic History   Marital status: Married    Spouse name: Not on file   Number of children: Not on file   Years of education: Not on file   Highest education level: Not on file  Occupational History   Not on  file  Tobacco Use   Smoking status: Former Smoker    Packs/day: 1.00    Years: 12.00    Pack years: 12.00    Quit date: 03/18/1979    Years since quitting: 40.2   Smokeless tobacco: Never Used   Tobacco comment: quit 40 years ago  Substance and Sexual Activity   Alcohol use: Yes    Comment: rarely   Drug use: Never   Sexual activity: Not on file  Other Topics Concern   Not on file  Social History Narrative   From [place]      Retired      Social Determinants of Radio broadcast assistant Strain: Low Risk    Difficulty of Paying Living Expenses: Not hard at all  Food Insecurity: No Food Insecurity   Worried About Charity fundraiser in the Last Year: Never true   Arboriculturist in the Last Year: Never true  Transportation Needs: No Transportation Needs   Lack of Transportation (Medical): No   Lack of Transportation (Non-Medical): No  Physical Activity: Sufficiently Active   Days of Exercise per Week: 3 days   Minutes of Exercise per Session: 50 min  Stress:  No Stress Concern Present   Feeling of Stress : Not at all  Social Connections:    Frequency of Communication with Friends and Family:    Frequency of Social Gatherings with Friends and Family:    Attends Religious Services:    Active Member of Clubs or Organizations:    Attends Music therapist:    Marital Status:   Intimate Partner Violence: Not At Risk   Fear of Current or Ex-Partner: No   Emotionally Abused: No   Physically Abused: No   Sexually Abused: No    Review of Systems: See HPI, otherwise negative ROS  Physical Exam: BP (!) 198/84   Pulse 93   Temp (!) 96.5 F (35.8 C) (Temporal)   Resp 16   Ht 5\' 1"  (1.549 m)   Wt 160 lb (72.6 kg)   LMP  (LMP Unknown)   SpO2 100%   BMI 30.23 kg/m  General:   Alert,  pleasant and cooperative in NAD Head:  Normocephalic and atraumatic. Neck:  Supple; no masses or thyromegaly. Lungs:  Clear throughout to auscultation.    Heart:  Regular  rate and rhythm. Abdomen:  Soft, nontender and nondistended. Normal bowel sounds, without guarding, and without rebound.   Neurologic:  Alert and  oriented x4;  grossly normal neurologically.  Impression/Plan: Tracy Lawrence is here for an endoscopy and colonoscopy to be performed for anemia, stool occult positive  Risks, benefits, limitations, and alternatives regarding  endoscopy and colonoscopy have been reviewed with the patient.  Questions have been answered.  All parties agreeable.   Sherri Sear, MD  06/14/2019, 9:38 AM

## 2019-06-14 NOTE — Anesthesia Postprocedure Evaluation (Signed)
Anesthesia Post Note  Patient: EZRAH RITTENOUR  Procedure(s) Performed: COLONOSCOPY WITH PROPOFOL (N/A ) ESOPHAGOGASTRODUODENOSCOPY (EGD) WITH PROPOFOL (N/A )  Patient location during evaluation: Endoscopy Anesthesia Type: General Level of consciousness: awake and alert Pain management: pain level controlled Vital Signs Assessment: post-procedure vital signs reviewed and stable Respiratory status: spontaneous breathing, nonlabored ventilation, respiratory function stable and patient connected to nasal cannula oxygen Cardiovascular status: blood pressure returned to baseline and stable Postop Assessment: no apparent nausea or vomiting Anesthetic complications: no     Last Vitals:  Vitals:   06/14/19 1045 06/14/19 1115  BP: (!) 142/65 140/66  Pulse: 76   Resp: 15   Temp: (!) 36.1 C   SpO2: 99%     Last Pain:  Vitals:   06/14/19 1115  TempSrc:   PainSc: 0-No pain                 Martha Clan

## 2019-06-14 NOTE — Op Note (Addendum)
[facility] Patient Name: Tracy Lawrence Procedure Date: 06/14/2019 9:51 AM MRN: [id number] Account #: [id number] Date of Birth: 01/25/1941 Admit Type: Outpatient Age: 79 Room: [facility] ENDO ROOM 3 Gender: Female Note Status: Finalized Procedure:             Upper GI endoscopy Indications:           Unexplained iron deficiency anemia Providers:             Lin Landsman MD, MD Referring MD:          Yvetta Coder. Arnett (Referring MD) Medicines:             Monitored Anesthesia Care Complications:         No immediate complications. Estimated blood loss: None. Procedure:             Pre-Anesthesia Assessment:                        - Prior to the procedure, a History and Physical was                         performed, and patient medications and allergies were                         reviewed. The patient is competent. The risks and                         benefits of the procedure and the sedation options and                         risks were discussed with the patient. All questions                         were answered and informed consent was obtained.                         Patient identification and proposed procedure were                         verified by the physician, the nurse, the                         anesthesiologist, the anesthetist and the technician                         in the pre-procedure area in the procedure room in the                         endoscopy suite. Mental Status Examination: alert and                         oriented. Airway Examination: normal oropharyngeal                         airway and neck mobility. Respiratory Examination:                         clear to auscultation. CV Examination: normal.  Prophylactic Antibiotics: The patient does not require                         prophylactic antibiotics. Prior Anticoagulants: The                         patient has taken no previous  anticoagulant or                         antiplatelet agents. ASA Grade Assessment: II - A                         patient with mild systemic disease. After reviewing                         the risks and benefits, the patient was deemed in                         satisfactory condition to undergo the procedure. The                         anesthesia plan was to use monitored anesthesia care                         (MAC). Immediately prior to administration of                         medications, the patient was re-assessed for adequacy                         to receive sedatives. The heart rate, respiratory                         rate, oxygen saturations, blood pressure, adequacy of                         pulmonary ventilation, and response to care were                         monitored throughout the procedure. The physical                         status of the patient was re-assessed after the                         procedure.                        After obtaining informed consent, the endoscope was                         passed under direct vision. Throughout the procedure,                         the patient's blood pressure, pulse, and oxygen                         saturations were monitored continuously. The Endoscope  was introduced through the mouth, and advanced to the                         second part of duodenum. The upper GI endoscopy was                         accomplished without difficulty. The patient tolerated                         the procedure well. Findings:      The duodenal bulb and second portion of the duodenum were normal.      Diffuse moderately erythematous mucosa without bleeding was found in the       entire examined stomach. Biopsies were taken with a cold forceps for       Helicobacter pylori testing.      The cardia and gastric fundus were normal on retroflexion.      The gastroesophageal junction and examined esophagus  were normal. Impression:            - Normal duodenal bulb and second portion of the                         duodenum.                        - Erythematous mucosa in the stomach. Biopsied.                        - Normal gastroesophageal junction and esophagus. Recommendation:        - Await pathology results.                        - Proceed with colonoscopy as scheduled                        See colonoscopy report Procedure Code(s):     --- Professional ---                        [phone], Esophagogastroduodenoscopy, flexible,                         transoral; with biopsy, single or multiple Diagnosis Code(s):     --- Professional ---                        K31.89, Other diseases of stomach and duodenum                        D50.9, Iron deficiency anemia, unspecified CPT copyright 2019 American Medical Association. All rights reserved. The codes documented in this report are preliminary and upon coder review may  be revised to meet current compliance requirements. Dr. Ulyess Mort Lin Landsman MD, MD 06/14/2019 10:06:06 AM This report has been signed electronically. Number of Addenda: 0 Note Initiated On: 06/14/2019 9:51 AM Estimated Blood Loss:  Estimated blood loss: none.      [facility]

## 2019-06-14 NOTE — Op Note (Addendum)
[facility]astroenterology Patient Name: Tracy Lawrence Procedure Date: 06/14/2019 9:50 AM MRN: [id number] Account #: [id number] Date of Birth: [birth year] Admit Type: Outpatient Age: 79 Room: [facility] ENDO ROOM 3 Gender: Female Note Status: Finalized Procedure:             Colonoscopy Indications:           Positive fecal immunochemical test Providers:             Lin Landsman MD, MD Referring MD:          Yvetta Coder. Arnett (Referring MD) Medicines:             Monitored Anesthesia Care Complications:         No immediate complications. Estimated blood loss:                         Minimal. Procedure:             Pre-Anesthesia Assessment:                        - Prior to the procedure, a History and Physical was                         performed, and patient medications and allergies were                         reviewed. The patient is competent. The risks and                         benefits of the procedure and the sedation options and                         risks were discussed with the patient. All questions                         were answered and informed consent was obtained.                         Patient identification and proposed procedure were                         verified by the physician, the nurse, the                         anesthesiologist, the anesthetist and the technician                         in the pre-procedure area in the procedure room in the                         endoscopy suite. Mental Status Examination: alert and                         oriented. Airway Examination: normal oropharyngeal                         airway and neck mobility. Respiratory Examination:  clear to auscultation. CV Examination: normal.                         Prophylactic Antibiotics: The patient does not require                         prophylactic antibiotics. Prior Anticoagulants: The                         patient has  taken no previous anticoagulant or                         antiplatelet agents. ASA Grade Assessment: II - A                         patient with mild systemic disease. After reviewing                         the risks and benefits, the patient was deemed in                         satisfactory condition to undergo the procedure. The                         anesthesia plan was to use monitored anesthesia care                         (MAC). Immediately prior to administration of                         medications, the patient was re-assessed for adequacy                         to receive sedatives. The heart rate, respiratory                         rate, oxygen saturations, blood pressure, adequacy of                         pulmonary ventilation, and response to care were                         monitored throughout the procedure. The physical                         status of the patient was re-assessed after the                         procedure.                        After obtaining informed consent, the colonoscope was                         passed under direct vision. Throughout the procedure,                         the patient's blood pressure, pulse, and oxygen  saturations were monitored continuously. The                         Colonoscope was introduced through the anus and                         advanced to the the cecum, identified by appendiceal                         orifice and ileocecal valve. The colonoscopy was                         unusually difficult due to multiple diverticula in the                         colon. Successful completion of the procedure was                         aided by withdrawing the scope and replacing with the                         pediatric colonoscope. The patient tolerated the                         procedure fairly well. The quality of the bowel                         preparation was evaluated using the  BBPS [facility] Bowel                         Preparation Scale) with scores of: Right Colon = 3,                         Transverse Colon = 3 and Left Colon = 3 (entire mucosa                         seen well with no residual staining, small fragments                         of stool or opaque liquid). The total BBPS score                         equals 9. Findings:      The perianal and digital rectal examinations were normal. Pertinent       negatives include normal sphincter tone and no palpable rectal lesions.      Nine sessile polyps were found in the sigmoid colon, transverse colon       and ascending colon. The polyps were 3 to 5 mm in size. These polyps       were removed with a cold snare. Resection and retrieval were complete.      Multiple diverticula were found in the sigmoid colon.      The retroflexed view of the distal rectum and anal verge was normal and       showed no anal or rectal abnormalities. Impression:            - Nine 3 to 5 mm polyps in the sigmoid colon, in  the                         transverse colon and in the ascending colon, removed                         with a cold snare. Resected and retrieved.                        - Diverticulosis in the sigmoid colon.                        - The distal rectum and anal verge are normal on                         retroflexion view. Recommendation:        - Discharge patient to home (with escort).                        - Resume previous diet today.                        - Continue present medications.                        - Await pathology results.                        - Repeat colonoscopy in 3 years for surveillance of                         multiple polyps. Procedure Code(s):     --- Professional ---                        [phone], Colonoscopy, flexible; with removal of                         tumor(s), polyp(s), or other lesion(s) by snare                         technique Diagnosis Code(s):     ---  Professional ---                        K63.5, Polyp of colon                        R19.5, Other fecal abnormalities                        K57.30, Diverticulosis of large intestine without                         perforation or abscess without bleeding CPT copyright 2019 American Medical Association. All rights reserved. The codes documented in this report are preliminary and upon coder review may  be revised to meet current compliance requirements. Dr. Ulyess Mort Lin Landsman MD, MD 06/14/2019 10:48:31 AM This report has been signed electronically. Number of Addenda: 0 Note Initiated On: 06/14/2019 9:50 AM Scope Withdrawal Time: 0 hours 24 minutes 48 seconds  Total Procedure Duration: 0 hours 33 minutes 41 seconds  Estimated Blood Loss:  Estimated blood loss was minimal.      [facility]

## 2019-06-14 NOTE — Anesthesia Procedure Notes (Signed)
Performed by: Cook-Martin, Maddoxx Burkitt Pre-anesthesia Checklist: Patient identified, Emergency Drugs available, Suction available, Patient being monitored and Timeout performed Patient Re-evaluated:Patient Re-evaluated prior to induction Oxygen Delivery Method: Nasal cannula Preoxygenation: Pre-oxygenation with 100% oxygen Induction Type: IV induction Airway Equipment and Method: Bite block Placement Confirmation: CO2 detector and positive ETCO2       

## 2019-06-17 ENCOUNTER — Telehealth: Payer: Self-pay | Admitting: Family

## 2019-06-17 NOTE — Telephone Encounter (Signed)
Patient called & informed of below. She will try taking BP med at night then will let us know some readings. She is scheduled for f/u in August.

## 2019-06-17 NOTE — Telephone Encounter (Signed)
Pt called to report BP readings   5/10  137/67 5/11  141/79 5/12  131/59 5/13  147/69 5/14  131/66 5/15  134/66 5/16  131/66

## 2019-06-17 NOTE — Telephone Encounter (Signed)
Call patient Systolic blood pressures are slightly elevated averaging in the [id number], diastolics in the low side therefore hesitate to increase her losartan to 50 mg.    advised her to stay on losartan 25 mg and take this medication in the evening to see if this helps lower blood pressure just a small amount.  Also low-salt diet, Mediterranean diet, exercise Ensure she is appointment with [place] in 3 mos

## 2019-06-18 ENCOUNTER — Telehealth: Payer: Self-pay

## 2019-06-18 ENCOUNTER — Encounter: Payer: Self-pay | Admitting: Gastroenterology

## 2019-06-18 LAB — SURGICAL PATHOLOGY

## 2019-06-18 MED ORDER — AMOXICILLIN 500 MG PO TABS
1000.0000 mg | ORAL_TABLET | Freq: Two times a day (BID) | ORAL | 0 refills | Status: AC
Start: 2019-06-18 — End: 2019-07-02

## 2019-06-18 MED ORDER — CLARITHROMYCIN 500 MG PO TABS
500.0000 mg | ORAL_TABLET | Freq: Two times a day (BID) | ORAL | 0 refills | Status: AC
Start: 2019-06-18 — End: 2019-07-02

## 2019-06-18 MED ORDER — OMEPRAZOLE 40 MG PO CPDR
40.0000 mg | DELAYED_RELEASE_CAPSULE | Freq: Two times a day (BID) | ORAL | 0 refills | Status: DC
Start: 2019-06-18 — End: 2019-09-27

## 2019-06-18 NOTE — Telephone Encounter (Signed)
Patient verbalized understanding. Patient made follow up appointment

## 2019-06-18 NOTE — Telephone Encounter (Signed)
-----   Message from Lin Landsman, MD sent at 06/18/2019 12:34 PM EDT ----- Recommend H. pylori treatment with triple therapy for 14 days Plz send her the prescription for triple therapy to treat H Pylori for 14days  Omeprazole 40mg  BID before meals Clarithromycin 500mg  BID after meals Amoxicillin 1gm BID after meals Recommend follow-up in 2 to 3 months, will do H. pylori breath test during that visit  ThanksRohini Vanga

## 2019-06-28 ENCOUNTER — Telehealth: Payer: Self-pay | Admitting: Family

## 2019-06-28 NOTE — Telephone Encounter (Signed)
Pt called and notified to continue losartan in the evenings.

## 2019-06-28 NOTE — Telephone Encounter (Signed)
Call pt Blood pressures look great and improved by taking losartan in the evenings.  Please continue.

## 2019-06-28 NOTE — Telephone Encounter (Signed)
Night- reading b/p ( 5-24) 106/73 (5-25)-107/70 ( 5-26) 115/70 (5-27) 113/65 (5-28) 136/68 pt called in these readingst

## 2019-06-28 NOTE — Telephone Encounter (Signed)
Pts BP readings

## 2019-08-17 ENCOUNTER — Other Ambulatory Visit: Payer: Self-pay | Admitting: Family

## 2019-08-18 ENCOUNTER — Other Ambulatory Visit: Payer: Self-pay | Admitting: Family

## 2019-08-22 ENCOUNTER — Other Ambulatory Visit: Payer: Self-pay | Admitting: Family

## 2019-08-22 DIAGNOSIS — Z1231 Encounter for screening mammogram for malignant neoplasm of breast: Secondary | ICD-10-CM

## 2019-09-13 ENCOUNTER — Ambulatory Visit: Payer: Medicare Other | Admitting: Family

## 2019-09-17 ENCOUNTER — Other Ambulatory Visit: Payer: Self-pay

## 2019-09-17 ENCOUNTER — Ambulatory Visit
Admission: RE | Admit: 2019-09-17 | Discharge: 2019-09-17 | Disposition: A | Discharge status: Home / Self Care | Payer: Medicare Other | Source: Ambulatory Visit | Attending: Family | Admitting: Family

## 2019-09-17 ENCOUNTER — Other Ambulatory Visit: Payer: Self-pay | Admitting: Family

## 2019-09-17 DIAGNOSIS — N6310 Unspecified lump in the right breast, unspecified quadrant: Secondary | ICD-10-CM

## 2019-09-17 DIAGNOSIS — R928 Other abnormal and inconclusive findings on diagnostic imaging of breast: Secondary | ICD-10-CM

## 2019-09-17 DIAGNOSIS — Z1231 Encounter for screening mammogram for malignant neoplasm of breast: Secondary | ICD-10-CM | POA: Diagnosis not present

## 2019-09-25 ENCOUNTER — Other Ambulatory Visit: Payer: Self-pay

## 2019-09-25 ENCOUNTER — Ambulatory Visit
Admission: RE | Admit: 2019-09-25 | Discharge: 2019-09-25 | Disposition: A | Discharge status: Home / Self Care | Payer: Medicare Other | Source: Ambulatory Visit | Attending: Family | Admitting: Family

## 2019-09-25 DIAGNOSIS — R928 Other abnormal and inconclusive findings on diagnostic imaging of breast: Secondary | ICD-10-CM | POA: Insufficient documentation

## 2019-09-25 DIAGNOSIS — N6310 Unspecified lump in the right breast, unspecified quadrant: Secondary | ICD-10-CM | POA: Insufficient documentation

## 2019-09-25 HISTORY — DX: Personal history of irradiation: Z92.3

## 2019-09-26 ENCOUNTER — Other Ambulatory Visit: Payer: Self-pay | Admitting: Family

## 2019-09-26 DIAGNOSIS — N6310 Unspecified lump in the right breast, unspecified quadrant: Secondary | ICD-10-CM

## 2019-09-26 DIAGNOSIS — N6459 Other signs and symptoms in breast: Secondary | ICD-10-CM

## 2019-09-26 DIAGNOSIS — R928 Other abnormal and inconclusive findings on diagnostic imaging of breast: Secondary | ICD-10-CM

## 2019-09-27 ENCOUNTER — Ambulatory Visit (INDEPENDENT_AMBULATORY_CARE_PROVIDER_SITE_OTHER): Payer: Medicare Other

## 2019-09-27 ENCOUNTER — Other Ambulatory Visit: Payer: Self-pay

## 2019-09-27 ENCOUNTER — Encounter: Payer: Self-pay | Admitting: Family

## 2019-09-27 ENCOUNTER — Ambulatory Visit (INDEPENDENT_AMBULATORY_CARE_PROVIDER_SITE_OTHER): Payer: Medicare Other | Admitting: Family

## 2019-09-27 VITALS — BP 158/74 | HR 81 | Temp 98.2°F | Ht 61.0 in | Wt 159.0 lb

## 2019-09-27 DIAGNOSIS — I10 Essential (primary) hypertension: Secondary | ICD-10-CM | POA: Diagnosis not present

## 2019-09-27 DIAGNOSIS — N6310 Unspecified lump in the right breast, unspecified quadrant: Secondary | ICD-10-CM

## 2019-09-27 DIAGNOSIS — N183 Chronic kidney disease, stage 3 unspecified: Secondary | ICD-10-CM

## 2019-09-27 DIAGNOSIS — R2242 Localized swelling, mass and lump, left lower limb: Secondary | ICD-10-CM | POA: Insufficient documentation

## 2019-09-27 DIAGNOSIS — E1122 Type 2 diabetes mellitus with diabetic chronic kidney disease: Secondary | ICD-10-CM

## 2019-09-27 DIAGNOSIS — D649 Anemia, unspecified: Secondary | ICD-10-CM

## 2019-09-27 MED ORDER — LOSARTAN POTASSIUM 50 MG PO TABS: 50.0000 mg | ORAL_TABLET | Freq: Every day | ORAL | 1 refills | Status: DC

## 2019-09-27 MED ORDER — ALLOPURINOL 100 MG PO TABS: 100.0000 mg | ORAL_TABLET | Freq: Every day | ORAL | 3 refills | Status: DC

## 2019-09-27 NOTE — Assessment & Plan Note (Signed)
Edema improving.She is able to weight bare. Pending XR

## 2019-09-27 NOTE — Assessment & Plan Note (Signed)
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 She has follow up with dr Marius Ditch after egd/ colonoscopy and after treatment of h pylori gastritis, will follow

## 2019-09-27 NOTE — Assessment & Plan Note (Signed)
Elevated, increase losartan with close monitoring of Crt. BMP one week.

## 2019-09-27 NOTE — Progress Notes (Signed)
Subjective:    Patient ID: Tracy Lawrence, female    DOB: Jun 05, 1940, 79 y.o.   MRN: [id number]  CC: Tracy Lawrence is a 79 y.o. female who presents today for follow up.   HPI:  Left dorsal aspect of foot is bruised, slight swelling. Injury of dropping jar 2 weeks ago. Bruising and swelling improved.   Anemia- no sob, fatigue.   Occasional right breast pain for past couple of years.  Abnormal mammogram; right breast biopsy ordered   HTN- compliant with losartan qhs. At home, 130/66. 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 Hemoglobin 11.5 on 09/16/19, slight worse from 11.7 06/03/19; Crt 1.14 ( improved from 1.32)   Treated for h pylori with biaxin and amoxil, omeprazole.   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 Anemia   . Breast cancer [facility]) 2002   right breast  . Breast cancer ([place]) 2011   left breast  . Cancer ([place])   . Hypertension   . Personal history of radiation therapy 2002, 2011   right and left breast  . Pre-diabetes    Past Surgical History:  Procedure Laterality Date  . ABDOMINAL HYSTERECTOMY  2018  . BREAST LUMPECTOMY Right 2002   breast ca with rad  . BREAST LUMPECTOMY Left 2011   breast ca with rad  . BREAST SURGERY  2011,2002  . CATARACT EXTRACTION  2015  . COLONOSCOPY    . COLONOSCOPY WITH PROPOFOL N/A 06/14/2019   Procedure: COLONOSCOPY WITH PROPOFOL;  Surgeon: Lin Landsman, MD;  Location: [facility] ENDOSCOPY;  Service: Gastroenterology;  Laterality: N/A;  . ESOPHAGOGASTRODUODENOSCOPY (EGD) WITH PROPOFOL N/A 06/14/2019   Procedure: ESOPHAGOGASTRODUODENOSCOPY (EGD) WITH PROPOFOL;  Surgeon: Lin Landsman, MD;  Location: [facility] ENDOSCOPY;  Service: Gastroenterology;  Laterality: N/A;   Family History  Problem Relation Age of Onset  . Heart  attack Mother   . Cancer Father   . Lung cancer Father   . Breast cancer Sister 89       dx twice age 16  . Non-Hodgkin's lymphoma Sister   . Dementia Sister 49  . Breast cancer Paternal Aunt   . Breast cancer Cousin   . Colon cancer Neg Hx     Allergies: Patient has no known allergies. Current Outpatient Medications on File Prior to Visit  Medication Sig Dispense Refill  . escitalopram (LEXAPRO) 10 MG tablet TAKE 1 TABLET BY MOUTH EVERY DAY FOR DEPRESSION 90 tablet 0  . Multiple Vitamins-Minerals (EYE VITAMINS PO) Take by mouth.    . rosuvastatin (CRESTOR) 40 MG tablet TAKE 1 TABLET BY MOUTH EVERY DAY FOR CHOLESTEROL 90 tablet 1   No current facility-administered medications on file prior to visit.    Social History   Tobacco Use  . Smoking status: Former Smoker    Packs/day: 1.00    Years: 12.00    Pack years: 12.00    Quit date: 03/18/1979    Years since quitting: 40.5  . Smokeless tobacco: Never Used  . Tobacco comment: quit 40 years ago  Vaping Use  . Vaping Use: Never used  Substance Use Topics  . Alcohol use: Yes    Comment: rarely  . Drug use: Never    Review of Systems  Constitutional: Negative for chills and fever.  Respiratory: Negative  for cough.   Cardiovascular: Negative for chest pain, palpitations and leg swelling.  Gastrointestinal: Negative for nausea and vomiting.  Musculoskeletal: Negative for arthralgias and myalgias.  Skin: Negative for wound.      Objective:    BP (!) 158/74   Pulse 81   Temp 98.2 F (36.8 C)   Ht 5\' 1"  (1.549 m)   Wt 159 lb (72.1 kg)   LMP  (LMP Unknown)   SpO2 97%   BMI 30.04 kg/m  BP Readings from Last 3 Encounters:  09/27/19 (!) 158/74  06/14/19 140/66  06/12/19 140/72   Wt Readings from Last 3 Encounters:  09/27/19 159 lb (72.1 kg)  06/14/19 160 lb (72.6 kg)  06/12/19 162 lb (73.5 kg)    Physical Exam Vitals reviewed.  Constitutional:      Appearance: She is well-developed.  Eyes:      Conjunctiva/sclera: Conjunctivae normal.  Cardiovascular:     Rate and Rhythm: Normal rate and regular rhythm.     Pulses: Normal pulses.     Heart sounds: Normal heart sounds.  Pulmonary:     Effort: Pulmonary effort is normal.     Breath sounds: Normal breath sounds. No wheezing, rhonchi or rales.  Musculoskeletal:       Feet:  Feet:     Comments: Localized edema left foot. No tenderness, erythema or increased warmth. Skin intact.  Skin:    General: Skin is warm and dry.  Neurological:     Mental Status: She is alert.  Psychiatric:        Speech: Speech normal.        Behavior: Behavior normal.        Thought Content: Thought content normal.        Assessment & Plan:   Problem List Items Addressed This Visit      Cardiovascular and Mediastinum   Essential hypertension - Primary    Elevated, increase losartan with close monitoring of Crt. BMP one week.      Relevant Medications   losartan (COZAAR) 50 MG tablet   Other Relevant Orders   Basic metabolic panel     Endocrine   CKD stage 3 due to type 2 diabetes mellitus (HCC)    Stable to improved,will follow      Relevant Medications   losartan (COZAAR) 50 MG tablet     Other   Anemia    Reviewing hematology and nephrology, appears most likely anemia of chronic disease. She has follow up with dr Marius Ditch after egd/ colonoscopy and after treatment of h pylori gastritis, will follow      Breast mass, right    We have left message today with [place] to schedule. Patient will let us know if she doesn't hear regarding this early next week.      Localized swelling of left foot    Edema improving.She is able to weight bare. Pending XR       Relevant Orders   DG Foot Complete Left       I have discontinued Bjorn Loser. Berte's omeprazole. I have also changed her losartan. Additionally, I am having her maintain her Multiple Vitamins-Minerals (EYE VITAMINS PO), rosuvastatin, and escitalopram.   Meds ordered this  encounter  Medications  . losartan (COZAAR) 50 MG tablet    Sig: Take 1 tablet (50 mg total) by mouth at bedtime.    Dispense:  90 tablet    Refill:  1    Order Specific Question:   Supervising Provider  Answer:   Crecencio Mc [[id number]]    Return precautions given.   Risks, benefits, and alternatives of the medications and treatment plan prescribed today were discussed, and patient expressed understanding.   Education regarding symptom management and diagnosis given to patient on AVS.  Continue to follow with Burnard Hawthorne, FNP for routine health maintenance.   Georgetta Haber and I agreed with plan.   Mable Paris, FNP

## 2019-09-27 NOTE — Patient Instructions (Signed)
Start losartan 50mg  Labs in one week It is imperative that you are seen AT least twice per year for labs and monitoring. Monitor blood pressure at home and me 5-6 reading on separate days. Goal is less than 120/80, based on newest guidelines, however we certainly want to be less than 130/80;  if persistently higher, please make sooner follow up appointment so we can recheck you blood pressure and manage/ adjust medications.  Stay safe!

## 2019-09-27 NOTE — Assessment & Plan Note (Signed)
We have left message today with Tracy Lawrence to schedule. Patient will let us know if she doesn't hear regarding this early next week.

## 2019-09-27 NOTE — Assessment & Plan Note (Signed)
Stable to improved,will follow

## 2019-10-01 ENCOUNTER — Encounter: Payer: Self-pay | Admitting: Gastroenterology

## 2019-10-01 ENCOUNTER — Ambulatory Visit (INDEPENDENT_AMBULATORY_CARE_PROVIDER_SITE_OTHER): Payer: Medicare Other | Admitting: Gastroenterology

## 2019-10-01 ENCOUNTER — Other Ambulatory Visit: Payer: Self-pay | Admitting: Family

## 2019-10-01 ENCOUNTER — Other Ambulatory Visit: Payer: Self-pay

## 2019-10-01 VITALS — BP 179/78 | HR 85 | Temp 98.1°F | Wt 166.0 lb

## 2019-10-01 DIAGNOSIS — A048 Other specified bacterial intestinal infections: Secondary | ICD-10-CM | POA: Diagnosis not present

## 2019-10-01 DIAGNOSIS — R928 Other abnormal and inconclusive findings on diagnostic imaging of breast: Secondary | ICD-10-CM

## 2019-10-01 DIAGNOSIS — N6310 Unspecified lump in the right breast, unspecified quadrant: Secondary | ICD-10-CM

## 2019-10-01 NOTE — Progress Notes (Signed)
Cephas Darby, MD [address]  [place]  [place], [place] [address]  Main: [phone]  Fax: [phone]    Gastroenterology Consultation  Referring Provider:     Burnard Hawthorne, FNP Primary Care Physician:  Burnard Hawthorne, FNP Primary Gastroenterologist:  Dr. Cephas Darby Reason for Consultation:     Normocytic anemia, stool occult positive        HPI:   Tracy Lawrence is a 79 y.o. female referred by Dr. Burnard Hawthorne, FNP  for consultation & management of normocytic anemia, stool occult positive.  Patient has mild anemia with no evidence of iron deficiency. Her hemoglobin was 11.8 in 06/2018 with MCV 77.6 suggestive of iron deficiency.  Most recently her hemoglobin was 11.3, MCV 83.2 and iron studies were normal. She also has mild chronic kidney disease  Patient does not smoke or drink alcohol Patient has moved from [place] to [place] over 1 year ago, moved closer to her daughter and grandchildren  Follow-up visit 10/01/2019 She underwent upper endoscopy and colonoscopy which revealed H. pylori gastritis that led to anemia.  Patient finished treatment for Helicobacter pylori infection.  She does not have any symptoms today.  Her iron deficiency anemia resolved  NSAIDs: None  Antiplts/Anticoagulants/Anti thrombotics: None  GI Procedures: Reports having had colonoscopy more than 7 years ago  EGD and colonoscopy 06/14/2019  - Normal duodenal bulb and second portion of the duodenum. - Erythematous mucosa in the stomach. Biopsied. - Normal gastroesophageal junction and esophagus.  - Nine 3 to 5 mm polyps in the sigmoid colon, in the transverse colon and in the ascending colon, removed with a cold snare. Resected and retrieved. - Diverticulosis in the sigmoid colon. - The distal rectum and anal verge are normal on retroflexion view. DIAGNOSIS:  A. STOMACH, RANDOM; COLD BIOPSY:  - GASTRIC OXYNTIC MUCOSA WITH CHRONIC ACTIVE H. PYLORI  GASTRITIS.  - NEGATIVE FOR DYSPLASIA AND MALIGNANCY.   Comment:  Due to the presence of focal active gastritis, an immunohistochemical  study directed against H. pylori was performed and is positive,  highlighting numerous H. pylori type organisms.   B. COLON POLYPS X2, ASCENDING; COLD SNARE:  - FRAGMENTS (X2) OF TUBULAR ADENOMAS.  - NEGATIVE FOR HIGH-GRADE DYSPLASIA AND MALIGNANCY.   C. COLON POLYPS X5, TRANSVERSE (X2) AND SIGMOID (X3); COLD SNARE:  - FRAGMENTS (X3) OF TUBULAR ADENOMAS.  - SINGLE FRAGMENT OF BENIGN COLONIC MUCOSA WITH SUPERFICIAL  HYPERPLASTIC/REACTIVE CHANGES.  - NEGATIVE FOR HIGH-GRADE DYSPLASIA AND MALIGNANCY.   D. COLON POLYPS X3, SIGMOID; COLD SNARE:  - FRAGMENTS (X2) OF TUBULAR ADENOMAS.  - NEGATIVE FOR HIGH-GRADE DYSPLASIA AND MALIGNANCY.   Past Medical History:  Diagnosis Date  . Anemia   . Breast cancer [facility]) 2002   right breast  . Breast cancer ([place]) 2011   left breast  . Cancer ([place])   . Hypertension   . Personal history of radiation therapy 2002, 2011   right and left breast  . Pre-diabetes     Past Surgical History:  Procedure Laterality Date  . ABDOMINAL HYSTERECTOMY  2018  . BREAST LUMPECTOMY Right 2002   breast ca with rad  . BREAST LUMPECTOMY Left 2011   breast ca with rad  . BREAST SURGERY  2011,2002  . CATARACT EXTRACTION  2015  . COLONOSCOPY    . COLONOSCOPY WITH PROPOFOL N/A 06/14/2019   Procedure: COLONOSCOPY WITH PROPOFOL;  Surgeon: Lin Landsman, MD;  Location: [facility] ENDOSCOPY;  Service: Gastroenterology;  Laterality: N/A;  . ESOPHAGOGASTRODUODENOSCOPY (EGD) WITH PROPOFOL N/A 06/14/2019   Procedure: ESOPHAGOGASTRODUODENOSCOPY (EGD) WITH PROPOFOL;  Surgeon: Lin Landsman, MD;  Location: [facility] ENDOSCOPY;  Service: Gastroenterology;  Laterality: N/A;    Current Outpatient Medications:  .  allopurinol (ZYLOPRIM) 100 MG tablet, Take 1 tablet (100 mg total) by mouth daily., Disp: 30 tablet, Rfl: 3 .  escitalopram  (LEXAPRO) 10 MG tablet, TAKE 1 TABLET BY MOUTH EVERY DAY FOR DEPRESSION, Disp: 90 tablet, Rfl: 0 .  losartan (COZAAR) 50 MG tablet, Take 1 tablet (50 mg total) by mouth at bedtime., Disp: 90 tablet, Rfl: 1 .  Multiple Vitamins-Minerals (EYE VITAMINS PO), Take by mouth., Disp: , Rfl:  .  rosuvastatin (CRESTOR) 40 MG tablet, TAKE 1 TABLET BY MOUTH EVERY DAY FOR CHOLESTEROL, Disp: 90 tablet, Rfl: 1    Family History  Problem Relation Age of Onset  . Heart attack Mother   . Cancer Father   . Lung cancer Father   . Breast cancer Sister 12       dx twice age 36  . Non-Hodgkin's lymphoma Sister   . Dementia Sister 51  . Breast cancer Paternal Aunt   . Breast cancer Cousin   . Colon cancer Neg Hx      Social History   Tobacco Use  . Smoking status: Former Smoker    Packs/day: 1.00    Years: 12.00    Pack years: 12.00    Quit date: 03/18/1979    Years since quitting: 40.5  . Smokeless tobacco: Never Used  . Tobacco comment: quit 40 years ago  Vaping Use  . Vaping Use: Never used  Substance Use Topics  . Alcohol use: Yes    Comment: rarely  . Drug use: Never    Allergies as of 10/01/2019  . (No Known Allergies)    Review of Systems:    All systems reviewed and negative except where noted in HPI.   Physical Exam:  BP (!) 179/78 (BP Location: Left Arm, Patient Position: Sitting, Cuff Size: Normal)   Pulse 85   Temp 98.1 F (36.7 C) (Oral)   Wt 166 lb (75.3 kg)   LMP  (LMP Unknown)   BMI 31.37 kg/m  No LMP recorded (lmp unknown). Patient has had a hysterectomy.  General:   Alert,  Well-developed, well-nourished, pleasant and cooperative in NAD Head:  Normocephalic and atraumatic. Eyes:  Sclera clear, no icterus.   Conjunctiva pink. Ears:  Normal auditory acuity. Nose:  No deformity, discharge, or lesions. Mouth:  No deformity or lesions,oropharynx pink & moist. Neck:  Supple; no masses or thyromegaly. Lungs:  Respirations even and unlabored.  Clear throughout to  auscultation.   No wheezes, crackles, or rhonchi. No acute distress. Heart:  Regular rate and rhythm; no murmurs, clicks, rubs, or gallops. Abdomen:  Normal bowel sounds. Soft, non-tender and non-distended without masses, hepatosplenomegaly or hernias noted.  No guarding or rebound tenderness.   Rectal: Not performed Msk:  Symmetrical without gross deformities. Good, equal movement & strength bilaterally. Pulses:  Normal pulses noted. Extremities:  No clubbing or edema.  No cyanosis. Neurologic:  Alert and oriented x3;  grossly normal neurologically. Skin:  Intact without significant lesions or rashes. No jaundice. Psych:  Alert and cooperative. Normal mood and affect.  Imaging Studies: Reviewed  Assessment and Plan:   Tracy Lawrence is a 79 y.o. female with hypertension, hyperlipidemia, stage III CKD is seen in for follow-up of normocytic anemia, stool occult positive  EGD  revealed H. pylori infection s/p treatment with triple therapy Perform H. pylori breath test today to confirm eradication  Stool occult positive Colonoscopy revealed multiple tubular adenomas Recommend surveillance colonoscopy in 2024  Follow up as needed   Cephas Darby, MD

## 2019-10-02 ENCOUNTER — Other Ambulatory Visit: Payer: Self-pay

## 2019-10-02 ENCOUNTER — Telehealth: Payer: Self-pay | Admitting: Family

## 2019-10-02 ENCOUNTER — Inpatient Hospital Stay: Payer: Medicare Other | Attending: Oncology

## 2019-10-02 DIAGNOSIS — K2950 Unspecified chronic gastritis without bleeding: Secondary | ICD-10-CM | POA: Insufficient documentation

## 2019-10-02 DIAGNOSIS — Z79899 Other long term (current) drug therapy: Secondary | ICD-10-CM | POA: Insufficient documentation

## 2019-10-02 DIAGNOSIS — Z807 Family history of other malignant neoplasms of lymphoid, hematopoietic and related tissues: Secondary | ICD-10-CM | POA: Diagnosis not present

## 2019-10-02 DIAGNOSIS — Z801 Family history of malignant neoplasm of trachea, bronchus and lung: Secondary | ICD-10-CM | POA: Insufficient documentation

## 2019-10-02 DIAGNOSIS — D631 Anemia in chronic kidney disease: Secondary | ICD-10-CM | POA: Diagnosis not present

## 2019-10-02 DIAGNOSIS — N1832 Chronic kidney disease, stage 3b: Secondary | ICD-10-CM | POA: Diagnosis not present

## 2019-10-02 DIAGNOSIS — Z8249 Family history of ischemic heart disease and other diseases of the circulatory system: Secondary | ICD-10-CM | POA: Diagnosis not present

## 2019-10-02 DIAGNOSIS — Z923 Personal history of irradiation: Secondary | ICD-10-CM | POA: Diagnosis not present

## 2019-10-02 DIAGNOSIS — Z87891 Personal history of nicotine dependence: Secondary | ICD-10-CM | POA: Diagnosis not present

## 2019-10-02 DIAGNOSIS — Z90710 Acquired absence of both cervix and uterus: Secondary | ICD-10-CM | POA: Diagnosis not present

## 2019-10-02 DIAGNOSIS — D509 Iron deficiency anemia, unspecified: Secondary | ICD-10-CM | POA: Diagnosis not present

## 2019-10-02 DIAGNOSIS — B9681 Helicobacter pylori [H. pylori] as the cause of diseases classified elsewhere: Secondary | ICD-10-CM | POA: Diagnosis not present

## 2019-10-02 DIAGNOSIS — I10 Essential (primary) hypertension: Secondary | ICD-10-CM | POA: Diagnosis not present

## 2019-10-02 DIAGNOSIS — Z803 Family history of malignant neoplasm of breast: Secondary | ICD-10-CM | POA: Insufficient documentation

## 2019-10-02 DIAGNOSIS — Z853 Personal history of malignant neoplasm of breast: Secondary | ICD-10-CM | POA: Insufficient documentation

## 2019-10-02 DIAGNOSIS — R778 Other specified abnormalities of plasma proteins: Secondary | ICD-10-CM

## 2019-10-02 LAB — COMPREHENSIVE METABOLIC PANEL
ALT: 16 U/L (ref 0–44)
AST: 20 U/L (ref 15–41)
Albumin: 3.9 g/dL (ref 3.5–5.0)
Alkaline Phosphatase: 76 U/L (ref 38–126)
Anion gap: 9 (ref 5–15)
BUN: 24 mg/dL — ABNORMAL HIGH (ref 8–23)
CO2: 27 mmol/L (ref 22–32)
Calcium: 9.1 mg/dL (ref 8.9–10.3)
Chloride: 100 mmol/L (ref 98–111)
Creatinine, Ser: 1.19 mg/dL — ABNORMAL HIGH (ref 0.44–1.00)
GFR calc Af Amer: 51 mL/min — ABNORMAL LOW (ref >60)
GFR calc non Af Amer: 44 mL/min — ABNORMAL LOW (ref >60)
Glucose, Bld: 133 mg/dL — ABNORMAL HIGH (ref 70–99)
Potassium: 4.8 mmol/L (ref 3.5–5.1)
Sodium: 136 mmol/L (ref 135–145)
Total Bilirubin: 0.3 mg/dL (ref 0.3–1.2)
Total Protein: 7.3 g/dL (ref 6.5–8.1)

## 2019-10-02 LAB — CBC WITH DIFFERENTIAL/PLATELET
Abs Immature Granulocytes: 0.01 10*3/uL (ref 0.00–0.07)
Basophils Absolute: 0 10*3/uL (ref 0.0–0.1)
Basophils Relative: 1 %
Eosinophils Absolute: 0.2 10*3/uL (ref 0.0–0.5)
Eosinophils Relative: 4 %
HCT: 33.1 % — ABNORMAL LOW (ref 36.0–46.0)
Hemoglobin: 10.7 g/dL — ABNORMAL LOW (ref 12.0–15.0)
Immature Granulocytes: 0 %
Lymphocytes Relative: 37 %
Lymphs Abs: 1.9 10*3/uL (ref 0.7–4.0)
MCH: 24.9 pg — ABNORMAL LOW (ref 26.0–34.0)
MCHC: 32.3 g/dL (ref 30.0–36.0)
MCV: 77.2 fL — ABNORMAL LOW (ref 80.0–100.0)
Monocytes Absolute: 0.5 10*3/uL (ref 0.1–1.0)
Monocytes Relative: 9 %
Neutro Abs: 2.6 10*3/uL (ref 1.7–7.7)
Neutrophils Relative %: 49 %
Platelets: 172 10*3/uL (ref 150–400)
RBC: 4.29 MIL/uL (ref 3.87–5.11)
RDW: 13.6 % (ref 11.5–15.5)
WBC: 5.2 10*3/uL (ref 4.0–10.5)
nRBC: 0 % (ref 0.0–0.2)

## 2019-10-02 LAB — FERRITIN: Ferritin: 106 ng/mL (ref 11–307)

## 2019-10-02 LAB — IRON AND TIBC
Iron: 45 ug/dL (ref 28–170)
Saturation Ratios: 16 % (ref 10.4–31.8)
TIBC: 283 ug/dL (ref 250–450)
UIBC: 238 ug/dL

## 2019-10-02 LAB — H. PYLORI BREATH TEST: H pylori Breath Test: NEGATIVE

## 2019-10-02 NOTE — Telephone Encounter (Signed)
Called patient & she will continue current regimen.

## 2019-10-02 NOTE — Telephone Encounter (Signed)
BP Readings  Fri 8/27  132/62  Sat 8/28  115/58  8/29 133/69  8/30 130/64  9/1 130/62

## 2019-10-02 NOTE — Telephone Encounter (Signed)
FYI

## 2019-10-02 NOTE — Telephone Encounter (Signed)
Call pt BP looks great Continue current regimen

## 2019-10-04 ENCOUNTER — Telehealth: Payer: Self-pay | Admitting: *Deleted

## 2019-10-04 ENCOUNTER — Other Ambulatory Visit: Payer: Self-pay

## 2019-10-04 ENCOUNTER — Other Ambulatory Visit: Payer: Medicare Other

## 2019-10-04 ENCOUNTER — Ambulatory Visit
Admission: RE | Admit: 2019-10-04 | Discharge: 2019-10-04 | Disposition: A | Discharge status: Home / Self Care | Payer: Medicare Other | Source: Ambulatory Visit | Attending: Family | Admitting: Family

## 2019-10-04 ENCOUNTER — Inpatient Hospital Stay: Payer: Medicare Other | Admitting: Oncology

## 2019-10-04 DIAGNOSIS — R928 Other abnormal and inconclusive findings on diagnostic imaging of breast: Secondary | ICD-10-CM

## 2019-10-04 DIAGNOSIS — N6310 Unspecified lump in the right breast, unspecified quadrant: Secondary | ICD-10-CM

## 2019-10-04 NOTE — Telephone Encounter (Signed)
Notified patient that Dr. Tasia Catchings will see her in 2 weeks and she will get a call with appt details.

## 2019-10-04 NOTE — Telephone Encounter (Signed)
Dr. Tasia Catchings, Patient had breast biopsy this morning ordered by PCP.  Would you like to r/s the appt for today that is a 6 month f/u?

## 2019-10-04 NOTE — Telephone Encounter (Signed)
Done Pt is aware of her sched 10/21/19 MD only

## 2019-10-04 NOTE — Telephone Encounter (Signed)
Patient has appointment with Dr. Tasia Catchings today but has just had her biopsy this morning she wants to know if she should proceed today with appointment or reschedule.

## 2019-10-04 NOTE — Telephone Encounter (Signed)
Please let patient know that Dr. Tasia Catchings would like to see her in 2 weeks, MD only.

## 2019-10-09 NOTE — Progress Notes (Signed)
Patient ID: Tracy Lawrence, female   DOB: September 19, 1940, 79 y.o.   MRN: [id number] Navigation initiated. Patien is scheduled with Dr. Tasia Catchings on Monday 10/14/19.  She is known to Dr. Tasia Catchings.  Will schedule surgical consult tomorrow after patient discusses with family.

## 2019-10-10 NOTE — Progress Notes (Signed)
Patient requests surgical consult with Dr. Bary Castilla.   Appointment scheduled for 10/14/19 at 3:15, and patient is aware.

## 2019-10-14 ENCOUNTER — Inpatient Hospital Stay: Payer: Medicare Other

## 2019-10-14 ENCOUNTER — Inpatient Hospital Stay (HOSPITAL_BASED_OUTPATIENT_CLINIC_OR_DEPARTMENT_OTHER): Payer: Medicare Other | Admitting: Oncology

## 2019-10-14 ENCOUNTER — Encounter: Payer: Self-pay | Admitting: Oncology

## 2019-10-14 ENCOUNTER — Other Ambulatory Visit: Payer: Self-pay

## 2019-10-14 VITALS — BP 163/83 | HR 80 | Temp 99.0°F | Resp 18 | Wt 161.0 lb

## 2019-10-14 DIAGNOSIS — D631 Anemia in chronic kidney disease: Secondary | ICD-10-CM

## 2019-10-14 DIAGNOSIS — D509 Iron deficiency anemia, unspecified: Secondary | ICD-10-CM

## 2019-10-14 DIAGNOSIS — N1832 Chronic kidney disease, stage 3b: Secondary | ICD-10-CM | POA: Diagnosis not present

## 2019-10-14 DIAGNOSIS — C50411 Malignant neoplasm of upper-outer quadrant of right female breast: Secondary | ICD-10-CM | POA: Diagnosis not present

## 2019-10-14 DIAGNOSIS — R778 Other specified abnormalities of plasma proteins: Secondary | ICD-10-CM

## 2019-10-14 DIAGNOSIS — Z853 Personal history of malignant neoplasm of breast: Secondary | ICD-10-CM | POA: Diagnosis not present

## 2019-10-14 LAB — COMPREHENSIVE METABOLIC PANEL
ALT: 19 U/L (ref 0–44)
AST: 21 U/L (ref 15–41)
Albumin: 4.1 g/dL (ref 3.5–5.0)
Alkaline Phosphatase: 73 U/L (ref 38–126)
Anion gap: 9 (ref 5–15)
BUN: 21 mg/dL (ref 8–23)
CO2: 26 mmol/L (ref 22–32)
Calcium: 9.5 mg/dL (ref 8.9–10.3)
Chloride: 101 mmol/L (ref 98–111)
Creatinine, Ser: 0.99 mg/dL (ref 0.44–1.00)
GFR calc Af Amer: >60 mL/min (ref >60)
GFR calc non Af Amer: 55 mL/min — ABNORMAL LOW (ref >60)
Glucose, Bld: 122 mg/dL — ABNORMAL HIGH (ref 70–99)
Potassium: 4.4 mmol/L (ref 3.5–5.1)
Sodium: 136 mmol/L (ref 135–145)
Total Bilirubin: 0.5 mg/dL (ref 0.3–1.2)
Total Protein: 7.9 g/dL (ref 6.5–8.1)

## 2019-10-14 LAB — CBC WITH DIFFERENTIAL/PLATELET
Abs Immature Granulocytes: 0.01 10*3/uL (ref 0.00–0.07)
Basophils Absolute: 0 10*3/uL (ref 0.0–0.1)
Basophils Relative: 1 %
Eosinophils Absolute: 0.2 10*3/uL (ref 0.0–0.5)
Eosinophils Relative: 4 %
HCT: 35.9 % — ABNORMAL LOW (ref 36.0–46.0)
Hemoglobin: 11.6 g/dL — ABNORMAL LOW (ref 12.0–15.0)
Immature Granulocytes: 0 %
Lymphocytes Relative: 37 %
Lymphs Abs: 2.1 10*3/uL (ref 0.7–4.0)
MCH: 25.2 pg — ABNORMAL LOW (ref 26.0–34.0)
MCHC: 32.3 g/dL (ref 30.0–36.0)
MCV: 77.9 fL — ABNORMAL LOW (ref 80.0–100.0)
Monocytes Absolute: 0.5 10*3/uL (ref 0.1–1.0)
Monocytes Relative: 9 %
Neutro Abs: 2.7 10*3/uL (ref 1.7–7.7)
Neutrophils Relative %: 49 %
Platelets: 181 10*3/uL (ref 150–400)
RBC: 4.61 MIL/uL (ref 3.87–5.11)
RDW: 14.1 % (ref 11.5–15.5)
WBC: 5.6 10*3/uL (ref 4.0–10.5)
nRBC: 0 % (ref 0.0–0.2)

## 2019-10-14 NOTE — Progress Notes (Addendum)
Hematology/Oncology Consult note [facility] Telephone:([phone] [phone]   Patient Care Team: Burnard Hawthorne, FNP as PCP - General (Family Medicine) Theodore Demark, RN as Oncology Nurse Navigator (Oncology)  REFERRING PROVIDER: Burnard Hawthorne, FNP  CHIEF COMPLAINTS/REASON FOR VISIT:  Evaluation for breast cancer  PERTINENT ONCOLOGY HISTORY Tracy Lawrence is a  79 y.o.  female with PMH listed below was seen in consultation at the request of  Burnard Hawthorne, FNP  for evaluation of abnormal SPEP. Reviewed patient's previous blood work via care everywhere. 08/27/2018, free light chain ratio 1.97, free kappa 79.5, free lambda 40.3. Protein electrophoresis showed increased alpha globulin.  #Patient moved from [place] to [place] last year. She reports history of right breast cancer in 2002 and left breast cancer in 2011.  She underwent right breast lumpectomy followed by adjuvant radiation followed by 5 years of tamoxifen.  Underwent left lumpectomy followed by adjuvant radiation followed by aromatase inhibitor for 5 years.  Currently she is not on any antiestrogen treatment.  Denies any chemotherapy treatment history.  Both breast cancer treatments were done in [place]. Obtained records from [facility] clinic breast surgery [place] [facility] Records were reviewed.  No pathology is available. 06/06/2017 screening mammogram showed postoperative changes in both breasts.  Architectural distortion has not changed significantly.  Skin thickening is similar.  Calcifications have increased mildly around the postoperative sites bilaterally.  No new dominant mass.   # Chronic history of kidney insufficiency, stage IIIb.  Patient sees Dr. Holley Raring.  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  # Breast exam was performed in seated and lying down position. Right breast lower inner quadrant lumpectomy scar  with focal scar tissue. Left breast upper outer quadrant, 1:00 lumpectomy scar with palpable round thickened breast tissue. No palpable axillary lymphadenopathy.  # #Family history of breast cancer in first-degree relatives, patient reports that she was previously tested negative for BRCA gene mutation.  Results were not available.  #INTERVAL HISTORY Tracy Lawrence is a 79 y.o. female who has above history reviewed by me today presents for follow up visit for breast cancer.  Problems and complaints are listed below: 09/17/2019 bilateral screening mammogram showed right breast mass which warrants further evaluation.  No suspicious findings in left breast. 09/25/2019, right diagnostic breast mammogram showed specialist right breast mass at 10:00. Right axilla is negative for adenopathy.  Patient underwent ultrasound-guided core biopsy of the right breast 10:00 location. 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  Grade 2, no DCIS or LVI.  ER/PR/HER-2 receptor status are pending. Patient is here for further discussion of management plan.  She will see surgery Dr. Bary Castilla tomorrow.  Previous breast cancer history please refer to pertinent oncology history.  Review of Systems  Constitutional: Positive for fatigue. Negative for appetite change, chills and fever.  HENT:   Negative for hearing loss and voice change.   Eyes: Negative for eye problems.  Respiratory: Negative for chest tightness and cough.   Cardiovascular: Negative for chest pain.  Gastrointestinal: Negative for abdominal distention, abdominal pain and blood in stool.  Endocrine: Negative for hot flashes.  Genitourinary: Negative for difficulty urinating and frequency.   Musculoskeletal: Negative for arthralgias.  Skin: Negative for itching and rash.  Neurological: Negative for extremity weakness.  Hematological: Negative for adenopathy.  Psychiatric/Behavioral: Negative for confusion.    MEDICAL HISTORY:   Past Medical History:  Diagnosis Date  . Anemia   .  Breast cancer [facility]) 2002   right breast  . Breast cancer ([place]) 2011   left breast  . Cancer ([place])   . Hypertension   . Personal history of radiation therapy 2002, 2011   right and left breast  . Pre-diabetes     SURGICAL HISTORY: Past Surgical History:  Procedure Laterality Date  . ABDOMINAL HYSTERECTOMY  2018  . BREAST BIOPSY Right 10/04/2018   u/s bx, vision marker, path pending  . BREAST LUMPECTOMY Right 2002   breast ca with rad  . BREAST LUMPECTOMY Left 2011   breast ca with rad  . BREAST SURGERY  2011,2002  . CATARACT EXTRACTION  2015  . COLONOSCOPY    . COLONOSCOPY WITH PROPOFOL N/A 06/14/2019   Procedure: COLONOSCOPY WITH PROPOFOL;  Surgeon: Lin Landsman, MD;  Location: [facility] ENDOSCOPY;  Service: Gastroenterology;  Laterality: N/A;  . ESOPHAGOGASTRODUODENOSCOPY (EGD) WITH PROPOFOL N/A 06/14/2019   Procedure: ESOPHAGOGASTRODUODENOSCOPY (EGD) WITH PROPOFOL;  Surgeon: Lin Landsman, MD;  Location: [facility] ENDOSCOPY;  Service: Gastroenterology;  Laterality: N/A;    SOCIAL HISTORY: Social History   Socioeconomic History  . Marital status: Married    Spouse name: Not on file  . Number of children: Not on file  . Years of education: Not on file  . Highest education level: Not on file  Occupational History  . Not on file  Tobacco Use  . Smoking status: Former Smoker    Packs/day: 1.00    Years: 12.00    Pack years: 12.00    Quit date: 03/18/1979    Years since quitting: 40.6  . Smokeless tobacco: Never Used  . Tobacco comment: quit 40 years ago  Vaping Use  . Vaping Use: Never used  Substance and Sexual Activity  . Alcohol use: Yes    Comment: rarely  . Drug use: Never  . Sexual activity: Not on file  Other Topics Concern  . Not on file  Social History Narrative   From [place]      Retired      Social Determinants of Radio broadcast assistant Strain: Guy   . Difficulty of Paying  Living Expenses: Not hard at all  Food Insecurity: No Food Insecurity  . Worried About Charity fundraiser in the Last Year: Never true  . Ran Out of Food in the Last Year: Never true  Transportation Needs: No Transportation Needs  . Lack of Transportation (Medical): No  . Lack of Transportation (Non-Medical): No  Physical Activity: Sufficiently Active  . Days of Exercise per Week: 3 days  . Minutes of Exercise per Session: 50 min  Stress: No Stress Concern Present  . Feeling of Stress : Not at all  Social Connections:   . Frequency of Communication with Friends and Family: Not on file  . Frequency of Social Gatherings with Friends and Family: Not on file  . Attends Religious Services: Not on file  . Active Member of Clubs or Organizations: Not on file  . Attends Archivist Meetings: Not on file  . Marital Status: Not on file  Intimate Partner Violence: Not At Risk  . Fear of Current or Ex-Partner: No  . Emotionally Abused: No  . Physically Abused: No  . Sexually Abused: No    FAMILY HISTORY: Family History  Problem Relation Age of Onset  . Heart attack Mother   . Cancer Father   . Lung cancer Father   . Breast cancer Sister 54  dx twice age 42  . Non-Hodgkin's lymphoma Sister   . Dementia Sister 27  . Breast cancer Paternal Aunt   . Breast cancer Cousin   . Colon cancer Neg Hx     ALLERGIES:  has No Known Allergies.  MEDICATIONS:  Current Outpatient Medications  Medication Sig Dispense Refill  . allopurinol (ZYLOPRIM) 100 MG tablet Take 1 tablet (100 mg total) by mouth daily. 30 tablet 3  . Calcium Carbonate (CALCIUM 500 PO) Take 1 tablet by mouth every other day.    . escitalopram (LEXAPRO) 10 MG tablet TAKE 1 TABLET BY MOUTH EVERY DAY FOR DEPRESSION 90 tablet 0  . losartan (COZAAR) 50 MG tablet Take 1 tablet (50 mg total) by mouth at bedtime. 90 tablet 1  . Multiple Vitamins-Minerals (EYE VITAMINS PO) Take by mouth.    . rosuvastatin (CRESTOR) 40  MG tablet TAKE 1 TABLET BY MOUTH EVERY DAY FOR CHOLESTEROL 90 tablet 1   No current facility-administered medications for this visit.     PHYSICAL EXAMINATION: ECOG PERFORMANCE STATUS: 0 - Asymptomatic Vitals:   10/14/19 1333  BP: (!) 163/83  Pulse: 80  Resp: 18  Temp: 99 F (37.2 C)   Filed Weights   10/14/19 1333  Weight: 161 lb (73 kg)    Physical Exam Constitutional:      General: She is not in acute distress. HENT:     Head: Normocephalic and atraumatic.  Eyes:     General: No scleral icterus. Cardiovascular:     Rate and Rhythm: Normal rate and regular rhythm.     Heart sounds: Normal heart sounds.  Pulmonary:     Effort: Pulmonary effort is normal. No respiratory distress.     Breath sounds: No wheezing.  Abdominal:     General: Bowel sounds are normal. There is no distension.     Palpations: Abdomen is soft.  Musculoskeletal:        General: No deformity. Normal range of motion.     Cervical back: Normal range of motion and neck supple.  Skin:    General: Skin is warm and dry.     Findings: No erythema or rash.  Neurological:     Mental Status: She is alert and oriented to person, place, and time. Mental status is at baseline.     Cranial Nerves: No cranial nerve deficit.     Coordination: Coordination normal.  Psychiatric:        Mood and Affect: Mood normal.   Breast exam was performed in seated and lying down position. Right breast lower inner quadrant lumpectomy scar with focal scar tissue.  I did not palpate discrete mass at right upper outer quadrant. Left breast upper outer quadrant, 1:00 lumpectomy scar with palpable round thickened breast tissue. No palpable axillary lymphadenopathy.   LABORATORY DATA:  I have reviewed the data as listed Lab Results  Component Value Date   WBC 5.6 10/14/2019   HGB 11.6 (L) 10/14/2019   HCT 35.9 (L) 10/14/2019   MCV 77.9 (L) 10/14/2019   PLT 181 10/14/2019   Recent Labs    03/15/19 0943  10/02/19 1324 10/14/19 1445  NA 137 136 136  K 4.3 4.8 4.4  CL 103 100 101  CO2 '30 27 26  ' GLUCOSE 104* 133* 122*  BUN 24* 24* 21  CREATININE 1.16 1.19* 0.99  CALCIUM 9.9 9.1 9.5  GFRNONAA  --  44* 55*  GFRAA  --  51* >60  PROT 7.6 7.3 7.9  ALBUMIN 3.9  3.9 4.1  AST '18 20 21  ' ALT '16 16 19  ' ALKPHOS 77 76 73  BILITOT 0.4 0.3 0.5   Iron/TIBC/Ferritin/ %Sat    Component Value Date/Time   IRON 45 10/02/2019 1324   TIBC 283 10/02/2019 1324   FERRITIN 106 10/02/2019 1324   IRONPCTSAT 16 10/02/2019 1324      RADIOGRAPHIC STUDIES: I have personally reviewed the radiological images as listed and agreed with the findings in the report. DG Foot Complete Left  Result Date: 09/27/2019 CLINICAL DATA:  Status post trauma. EXAM: LEFT FOOT - COMPLETE 3+ VIEW COMPARISON:  None. FINDINGS: There is no evidence of an acute fracture or dislocation. A chronic fracture deformity is seen involving the base of the fifth left metatarsal. Mild degenerative changes seen along the dorsal aspect of the mid left foot. Mild focal dorsal soft tissue swelling is seen along the mid to distal left foot. IMPRESSION: Chronic fracture deformity involving the base of the fifth left metatarsal. Electronically Signed   By: Virgina Norfolk M.D.   On: 09/27/2019 15:46   US BREAST LTD UNI RIGHT INC AXILLA  Result Date: 09/25/2019 CLINICAL DATA:  Patient returns after screening study for evaluation of a possible RIGHT breast mass. History of RIGHT lumpectomy with sentinel lymph node biopsy and radiation treatment 2002. Status post LEFT lumpectomy with sentinel lymph node biopsy and MammoSite radiation in 2011. EXAM: DIGITAL DIAGNOSTIC RIGHT MAMMOGRAM WITH CAD AND TOMO ULTRASOUND RIGHT BREAST COMPARISON:  Previous exam(s). ACR Breast Density Category b: There are scattered areas of fibroglandular density. FINDINGS: Additional 2-D and 3-D images are performed. These views from presence of a spiculated mass in the LATERAL  portion of the RIGHT breast and further evaluated with ultrasound. Mammographic images were processed with CAD. On physical exam, I palpate no mass in the LATERAL aspect of the RIGHT breast. Targeted ultrasound is performed, showing an irregular hypoechoic mass with posterior acoustic shadowing in the 10 o'clock location of the RIGHT breast 5 centimeters from the nipple. Mass is 0.7 x 0.6 x 0.6 centimeters. Internal blood flow is confirmed on Doppler evaluation. Evaluation of the RIGHT axilla is negative for adenopathy. IMPRESSION: Suspicious mass the 10 o'clock location of the RIGHT breast. RECOMMENDATION: Ultrasound-guided core biopsy of the RIGHT breast 10 o'clock location. I have discussed the findings and recommendations with the patient. If applicable, a reminder letter will be sent to the patient regarding the next appointment. BI-RADS CATEGORY  4: Suspicious. Electronically Signed   By: Nolon Nations M.D.   On: 09/25/2019 14:04   MM DIAG BREAST TOMO UNI RIGHT  Result Date: 09/25/2019 CLINICAL DATA:  Patient returns after screening study for evaluation of a possible RIGHT breast mass. History of RIGHT lumpectomy with sentinel lymph node biopsy and radiation treatment 2002. Status post LEFT lumpectomy with sentinel lymph node biopsy and MammoSite radiation in 2011. EXAM: DIGITAL DIAGNOSTIC RIGHT MAMMOGRAM WITH CAD AND TOMO ULTRASOUND RIGHT BREAST COMPARISON:  Previous exam(s). ACR Breast Density Category b: There are scattered areas of fibroglandular density. FINDINGS: Additional 2-D and 3-D images are performed. These views from presence of a spiculated mass in the LATERAL portion of the RIGHT breast and further evaluated with ultrasound. Mammographic images were processed with CAD. On physical exam, I palpate no mass in the LATERAL aspect of the RIGHT breast. Targeted ultrasound is performed, showing an irregular hypoechoic mass with posterior acoustic shadowing in the 10 o'clock location of the RIGHT  breast 5 centimeters from the nipple. Mass is 0.7  x 0.6 x 0.6 centimeters. Internal blood flow is confirmed on Doppler evaluation. Evaluation of the RIGHT axilla is negative for adenopathy. IMPRESSION: Suspicious mass the 10 o'clock location of the RIGHT breast. RECOMMENDATION: Ultrasound-guided core biopsy of the RIGHT breast 10 o'clock location. I have discussed the findings and recommendations with the patient. If applicable, a reminder letter will be sent to the patient regarding the next appointment. BI-RADS CATEGORY  4: Suspicious. Electronically Signed   By: Nolon Nations M.D.   On: 09/25/2019 14:04   MM 3D SCREEN BREAST BILATERAL  Result Date: 09/17/2019 CLINICAL DATA:  Screening. EXAM: DIGITAL SCREENING BILATERAL MAMMOGRAM WITH TOMO AND CAD COMPARISON:  Previous exam(s). ACR Breast Density Category b: There are scattered areas of fibroglandular density. FINDINGS: In the right breast, a possible mass warrants further evaluation. In the left breast, no findings suspicious for malignancy. Images were processed with CAD. IMPRESSION: Further evaluation is suggested for possible mass in the right breast. RECOMMENDATION: Diagnostic mammogram and possibly ultrasound of the right breast. (Code:[id number]) The patient will be contacted regarding the findings, and additional imaging will be scheduled. BI-RADS CATEGORY  0: Incomplete. Need additional imaging evaluation and/or prior mammograms for comparison. Electronically Signed   By: Ammie Ferrier M.D.   On: 09/17/2019 10:58   MM CLIP PLACEMENT RIGHT  Result Date: 10/04/2019 CLINICAL DATA:  Status post ultrasound-guided core needle biopsy of a 7 mm mass in the 10 o'clock position of the right breast. EXAM: DIAGNOSTIC RIGHT MAMMOGRAM POST ULTRASOUND BIOPSY COMPARISON:  Previous exam(s). FINDINGS: Mammographic images were obtained following ultrasound guided biopsy of the recently demonstrated 7 mm mass in the 10 o'clock position of the right breast, 5 cm  from the nipple. The biopsy marking clip is in expected position at the site of biopsy. IMPRESSION: Appropriate positioning of the Hologic Vision shaped biopsy marking clip at the site of biopsy in the 10 o'clock position of the right breast. Final Assessment: Post Procedure Mammograms for Marker Placement Electronically Signed   By: Claudie Revering M.D.   On: 10/04/2019 08:53   [place] RT BREAST BX W LOC DEV 1ST LESION IMG BX SPEC US GUIDE  Addendum Date: 10/09/2019   ADDENDUM REPORT: 10/09/2019 13:50 ADDENDUM: PATHOLOGY revealed: A. BREAST, RIGHT AT 10:00, 5 CM FROM THE NIPPLE; ULTRASOUND-GUIDED CORE NEEDLE BIOPSY: - INVASIVE MAMMARY CARCINOMA, NO SPECIAL TYPE. 1.5 mm in this sample. Histologic grade of invasive carcinoma: Grade 2. Ductal carcinoma in situ: Not identified. Lymphovascular invasion: Not identified. Pathology results are CONCORDANT with imaging findings, per Dr. Claudie Revering. Pathology results and recommendations below were discussed with patient by telephone on 10/09/2019. Patient reported biopsy site within normal limits with slight tenderness at the site. Post biopsy care instructions were reviewed, questions were answered and my direct phone number was provided to patient. Patient was instructed to call [facility] if any concerns or questions arise related to the biopsy. Recommendation: Request for surgical consultation was relayed to Al Pimple RN and Tanya Nones RN at [facility] by Electa Sniff RN on 10/09/2019. Pathology results reported by Electa Sniff RN on 10/09/2019. Electronically Signed   By: Claudie Revering M.D.   On: 10/09/2019 13:50   Result Date: 10/09/2019 CLINICAL DATA:  7 mm mass suspicious for malignancy in the 10 o'clock location of the right breast recent mammography and ultrasound. EXAM: ULTRASOUND GUIDED RIGHT BREAST CORE NEEDLE BIOPSY COMPARISON:  Previous exam(s). PROCEDURE: I met with the patient and we discussed the procedure of ultrasound-guided  biopsy, including benefits and alternatives. We discussed the high likelihood of a successful procedure. We discussed the risks of the procedure, including infection, bleeding, tissue injury, clip migration, and inadequate sampling. Informed written consent was given. The usual time-out protocol was performed immediately prior to the procedure. Lesion quadrant: Upper outer quadrant Using sterile technique and 1% Lidocaine as local anesthetic, under direct ultrasound visualization, a 12 gauge spring-loaded device was used to perform biopsy of the recently demonstrated 7 mm mass in the 7 o'clock position of the right breast, 5 cm from the nipple, using a caudal approach. At the conclusion of the procedure a Hologic Vision tissue marker clip was deployed into the biopsy cavity. Follow up 2 view mammogram was performed and dictated separately. IMPRESSION: Ultrasound guided biopsy of the recently demonstrated 7 mm mass in the 10 o'clock position of the right breast. No apparent complications. Electronically Signed: By: Claudie Revering M.D. On: 10/04/2019 08:37       ASSESSMENT & PLAN:  1. Malignant neoplasm of upper-outer quadrant of right female breast, unspecified estrogen receptor status ([place])   2. History of breast cancer   3. Anemia due to stage 3b chronic kidney disease   4. Microcytic anemia    #Right breast invasive carcinoma, grade 2 ER/PR/HER-2 status is pending. Mammogram image was independent reviewed by me and discussed with patient. Pathology results were discussed. cT1 N0 disease. Final recommendation depends on ER/PR/HER-2 status.  History of breast cancer status post radiation.  She reports that she had genetic testing done in the past and was tested negative.  Test results were not available to me. It is unclear whether she had comprehensive panel done or limited panel done. Refer to genetic counselor for further discussion of need of repeating testing.  #Chronic microcytic anemia,  iron panel is not consistent with iron deficiency. Normal hemoglobin evaluation.  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 Hemoglobin stable at 11.6. GI work-up includes EGD and colonoscopy on 06/14/2019.  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  Benign colon polyps.  No high-grade dysplasia or malignancy.  She follows up with Dr. Marius Ditch Anemia can be also secondary to CKD.    Orders Placed This Encounter  Procedures  . CBC with Differential/Platelet    Standing Status:   Future    Number of Occurrences:   1    Standing Expiration Date:   10/13/2020  . Comprehensive metabolic panel    Standing Status:   Future    Number of Occurrences:   1    Standing Expiration Date:   10/13/2020  . Cancer antigen 27.29    Standing Status:   Future    Number of Occurrences:   1    Standing Expiration Date:   10/13/2020  . Cancer antigen 15-3    Standing Status:   Future    Number of Occurrences:   1    Standing Expiration Date:   10/13/2020  . Ambulatory referral to Genetics    Referral Priority:   Routine    Referral Type:   Consultation    Referral Reason:   Specialty Services Required    Number of Visits Requested:   1    All questions were answered. The patient knows to call the clinic with any problems questions or concerns.   Return of visit: 6 months. Thank you for this kind referral and the opportunity to participate in the care of this patient. A copy of today's note is routed to referring provider  Earlie Server, MD, PhD Hematology Oncology [facility] Pager- [phone] 10/14/2019

## 2019-10-14 NOTE — Progress Notes (Signed)
Initial Med/Onc visit with Dr. Tasia Catchings.  Receptor status pending per pathology.  Given Breast Cancer Treatment Handbook .  Will discuss genetic counseling with Tracy Lawrence.  Patient had negative BRCA test in 2011.  She lived in [place] at that time.

## 2019-10-14 NOTE — Progress Notes (Signed)
A known pt to Dr Tasia Catchings. Today is her 1st visit for Breast Cancer. Biopsy site feels fine to Right Breast..

## 2019-10-15 LAB — CANCER ANTIGEN 15-3: CA 15-3: 22.5 U/mL (ref 0.0–25.0)

## 2019-10-15 LAB — CANCER ANTIGEN 27.29: CA 27.29: 40.3 U/mL — ABNORMAL HIGH (ref 0.0–38.6)

## 2019-10-16 LAB — SURGICAL PATHOLOGY

## 2019-10-18 ENCOUNTER — Other Ambulatory Visit: Payer: Self-pay

## 2019-10-18 ENCOUNTER — Telehealth: Payer: Self-pay

## 2019-10-18 ENCOUNTER — Ambulatory Visit: Payer: Medicare Other

## 2019-10-18 NOTE — Telephone Encounter (Signed)
Failed attempt to reach patient for scheduled annual wellness visit. Left message to call back within allotted timeframe or reschedule.

## 2019-10-21 ENCOUNTER — Ambulatory Visit: Payer: Medicare Other | Admitting: Oncology

## 2019-10-23 ENCOUNTER — Inpatient Hospital Stay (HOSPITAL_BASED_OUTPATIENT_CLINIC_OR_DEPARTMENT_OTHER): Payer: Medicare Other | Admitting: Licensed Clinical Social Worker

## 2019-10-23 ENCOUNTER — Inpatient Hospital Stay: Payer: Medicare Other

## 2019-10-23 ENCOUNTER — Other Ambulatory Visit: Payer: Self-pay

## 2019-10-23 ENCOUNTER — Encounter: Payer: Self-pay | Admitting: Licensed Clinical Social Worker

## 2019-10-23 DIAGNOSIS — Z853 Personal history of malignant neoplasm of breast: Secondary | ICD-10-CM

## 2019-10-23 DIAGNOSIS — Z803 Family history of malignant neoplasm of breast: Secondary | ICD-10-CM | POA: Insufficient documentation

## 2019-10-23 DIAGNOSIS — Z801 Family history of malignant neoplasm of trachea, bronchus and lung: Secondary | ICD-10-CM | POA: Diagnosis not present

## 2019-10-23 DIAGNOSIS — Z8042 Family history of malignant neoplasm of prostate: Secondary | ICD-10-CM | POA: Diagnosis not present

## 2019-10-23 DIAGNOSIS — C50911 Malignant neoplasm of unspecified site of right female breast: Secondary | ICD-10-CM | POA: Insufficient documentation

## 2019-10-23 DIAGNOSIS — Z807 Family history of other malignant neoplasms of lymphoid, hematopoietic and related tissues: Secondary | ICD-10-CM

## 2019-10-23 NOTE — Progress Notes (Signed)
REFERRING PROVIDER: Earlie Server, MD [place],  [place] [address]  PRIMARY PROVIDER:  Burnard Hawthorne, FNP  PRIMARY REASON FOR VISIT:  1. History of breast cancer   2. Family history of breast cancer   3. Family history of prostate cancer   4. Family history of lung cancer   5. Family history of non-Hodgkin's lymphoma   6. Malignant neoplasm of right female breast, unspecified estrogen receptor status, unspecified site of breast ([place])      HISTORY OF PRESENT ILLNESS:   Tracy Lawrence, a 79 y.o. female, was seen for a [place] cancer genetics consultation at the request of Dr. Tasia Catchings due to a personal and family history of cancer.  Tracy Lawrence presents to clinic today to discuss the possibility of a hereditary predisposition to cancer, genetic testing, and to further clarify her future cancer risks, as well as potential cancer risks for family members.   In 2002, at the age of 68, Tracy Lawrence was diagnosed with right breast cancer, treated with lumpectomy and radiation. In 2011, at the age of 32, she was diagnosed with left breast cancer, treated with lumpectomy and radiation. She also had genetic testing at the time that was negative. In 2021, at the age of 81, Tracy Lawrence was diagnosed with invasive mammary carcinoma of the right breast. The treatment plan includes surgery but patient is unsure what type of surgery she will be having, she would potentially use genetic test results to help with her surgical decision.   CANCER HISTORY:  Oncology History   No history exists.     RISK FACTORS:  Menarche was at age 63.  First live birth at age 82.  OCP use for approximately 10 years.  Ovaries intact: no.  Hysterectomy: yes.  Menopausal status: postmenopausal.  HRT use: 0 years. Colonoscopy: yes; reports less than 10 polyps. Mammogram within the last year: yes.   Past Medical History:  Diagnosis Date  . Anemia   . Breast cancer [facility]) 2002   right breast  . Breast cancer  ([place]) 2011   left breast  . Cancer ([place])   . Family history of breast cancer   . Family history of lung cancer   . Family history of non-Hodgkin's lymphoma   . Family history of prostate cancer   . Hypertension   . Malignant neoplasm of right breast ([place])   . Personal history of radiation therapy 2002, 2011   right and left breast  . Pre-diabetes     Past Surgical History:  Procedure Laterality Date  . ABDOMINAL HYSTERECTOMY  2018  . BREAST BIOPSY Right 10/04/2018   u/s bx, vision marker, path pending  . BREAST LUMPECTOMY Right 2002   breast ca with rad  . BREAST LUMPECTOMY Left 2011   breast ca with rad  . BREAST SURGERY  2011,2002  . CATARACT EXTRACTION  2015  . COLONOSCOPY    . COLONOSCOPY WITH PROPOFOL N/A 06/14/2019   Procedure: COLONOSCOPY WITH PROPOFOL;  Surgeon: Lin Landsman, MD;  Location: [facility] ENDOSCOPY;  Service: Gastroenterology;  Laterality: N/A;  . ESOPHAGOGASTRODUODENOSCOPY (EGD) WITH PROPOFOL N/A 06/14/2019   Procedure: ESOPHAGOGASTRODUODENOSCOPY (EGD) WITH PROPOFOL;  Surgeon: Lin Landsman, MD;  Location[facility]COPY;  Service: Gastroenterology;  Laterality: N/A;    Social History   Socioeconomic History  . Marital status: Married    Spouse name: Not on file  . Number of children: Not on file  . Years of education: Not on file  .  Highest education level: Not on file  Occupational History  . Not on file  Tobacco Use  . Smoking status: Former Smoker    Packs/day: 1.00    Years: 12.00    Pack years: 12.00    Quit date: 03/18/1979    Years since quitting: 40.6  . Smokeless tobacco: Never Used  . Tobacco comment: quit 40 years ago  Vaping Use  . Vaping Use: Never used  Substance and Sexual Activity  . Alcohol use: Yes    Comment: rarely  . Drug use: Never  . Sexual activity: Not on file  Other Topics Concern  . Not on file  Social History Narrative   From [place]      Retired      Social Determinants of Systems developer Strain:   . Difficulty of Paying Living Expenses: Not on file  Food Insecurity:   . Worried About Charity fundraiser in the Last Year: Not on file  . Ran Out of Food in the Last Year: Not on file  Transportation Needs:   . Lack of Transportation (Medical): Not on file  . Lack of Transportation (Non-Medical): Not on file  Physical Activity:   . Days of Exercise per Week: Not on file  . Minutes of Exercise per Session: Not on file  Stress:   . Feeling of Stress : Not on file  Social Connections:   . Frequency of Communication with Friends and Family: Not on file  . Frequency of Social Gatherings with Friends and Family: Not on file  . Attends Religious Services: Not on file  . Active Member of Clubs or Organizations: Not on file  . Attends Archivist Meetings: Not on file  . Marital Status: Not on file     FAMILY HISTORY:  We obtained a detailed, 4-generation family history.  Significant diagnoses are listed below: Family History  Problem Relation Age of Onset  . Heart attack Mother   . Cancer Father   . Lung cancer Father   . Breast cancer Sister 62       dx again 48  . Non-Hodgkin's lymphoma Sister 9  . Dementia Sister 91  . Breast cancer Paternal Aunt   . Breast cancer Maternal Aunt   . Prostate cancer Paternal Grandfather        metastic, d. 81s  . Colon cancer Neg Hx    Tracy Lawrence had a son and a daughter, her son passed away from diabetes complications. Patient had 3 sisters, 1 brother, and 2 paternal half sisters. One of her sisters had breast cancer at 33, again at 76 and died at 57. Another sister had non-Hodgkin's lymphoma and died at 56.  Tracy Lawrence mother died at 44 due to heart issues. Patient had 4 maternal aunts, 2 maternal uncles. One aunt had breast cancer in her 92s. No known cancers in maternal cousins. Maternal grandmother passed at 33, grandfather passed over age 28.   Tracy Lawrence father had lung cancer due to exposure to radiation  at his job. Patient had 4 paternal aunts, 1 uncle. One of her aunts had breast cancer diagnosed over age 55. No known cancers in paternal cousins. Paternal grandfather had prostate cancer that was metastatic, he passed in his 98s. No cancer for paternal grandmother.   Ms. Rothenberger is aware of previous family history of genetic testing for hereditary cancer risks. Patient's ancestors are of [place], [place], and unknown descent. There is no reported Ashkenazi  Jewish ancestry. There is no known consanguinity.  GENETIC COUNSELING ASSESSMENT: Ms. Quinteros is a 79 y.o. female with a personal and family history of breast cancer which is somewhat suggestive of a hereditary cancer syndrome and predisposition to cancer. We, therefore, discussed and recommended the following at today's visit.   DISCUSSION: We discussed that approximately 5-10% of breast cancer is hereditary  Most cases of hereditary breast cancer are associated with BRCA1/BRCA2 genes, although there are other genes associated with hereditary breast cancer as well including CHEK2, ATM and PALB2 .  We discussed that testing is beneficial for several reasons including surgical decision-making for breast cancer, knowing about other cancer risks, identifying potential screening and risk-reduction options that may be appropriate, and to understand if other family members could be at risk for cancer and allow them to undergo genetic testing.   We reviewed the characteristics, features and inheritance patterns of hereditary cancer syndromes. We also discussed genetic testing, including the appropriate family members to test, the process of testing, insurance coverage and turn-around-time for results. We discussed the implications of a negative, positive and/or variant of uncertain significant result. In order to get genetic test results in a timely manner so that Ms. Fuente can use these genetic test results for surgical decisions, we recommended Ms. Huot  pursue genetic testing for the Breast Cancer STAT panel. Once complete, we recommend Ms. Weddington pursue reflex genetic testing to the Multi-Cancer gene panel.   The STAT Breast cancer panel offered by [facility] includes sequencing and rearrangement analysis for the following 9 genes:  ATM, BRCA1, BRCA2, CDH1, CHEK2, PALB2, PTEN, STK11 and TP53.    The Multi-Cancer Panel offered by [facility] includes sequencing and/or deletion duplication testing of the following 85 genes: AIP, ALK, APC, ATM, AXIN2,BAP1,  BARD1, BLM, BMPR1A, BRCA1, BRCA2, BRIP1, CASR, CDC73, CDH1, CDK4, CDKN1B, CDKN1C, CDKN2A (p14ARF), CDKN2A (p16INK4a), CEBPA, CHEK2, CTNNA1, DICER1, DIS3L2, EGFR (c.2369C>T, p.Thr790Met variant only), EPCAM (Deletion/duplication testing only), FH, FLCN, GATA2, GPC3, GREM1 (Promoter region deletion/duplication testing only), HOXB13 (c.251G>A, p.Gly84Glu), HRAS, KIT, MAX, MEN1, MET, MITF (c.952G>A, p.Glu318Lys variant only), MLH1, MSH2, MSH3, MSH6, MUTYH, NBN, NF1, NF2, NTHL1, PALB2, PDGFRA, PHOX2B, PMS2, POLD1, POLE, POT1, PRKAR1A, PTCH1, PTEN, RAD50, RAD51C, RAD51D, RB1, RECQL4, RET, RNF43, RUNX1, SDHAF2, SDHA (sequence changes only), SDHB, SDHC, SDHD, SMAD4, SMARCA4, SMARCB1, SMARCE1, STK11, SUFU, TERC, TERT, TMEM127, TP53, TSC1, TSC2, VHL, WRN and WT1.   Based on Ms. Kirsh's personal and family history of cancer, she meets medical criteria for genetic testing. Despite that she meets criteria, she may still have an out of pocket cost.   PLAN: After considering the risks, benefits, and limitations, Ms. Edrington provided informed consent to pursue genetic testing and the blood sample was sent to [facility] for analysis of the Breast Cancer STAT Panel + Multi-Cancer Panel. Results should be available within approximately 1 weeks' time, at which point they will be disclosed by telephone to Ms. Medellin, as will any additional recommendations warranted by these results. Ms. Palmatier will receive a summary of her  genetic counseling visit and a copy of her results once available. This information will also be available in Epic.   Ms. Gonser questions were answered to her satisfaction today. Our contact information was provided should additional questions or concerns arise. Thank you for the referral and allowing [place] to share in the care of your patient.   Faith Rogue, MS, [facility] Genetic Counselor [email or web address] Phone: [phone]  The patient was seen for a total of 30 minutes in face-to-face  genetic counseling.  Dr. Grayland Ormond was available for discussion regarding this case.   _______________________________________________________________________ For Office Staff:  Number of people involved in session: 1 Was an Intern/ student involved with case: no

## 2019-10-28 ENCOUNTER — Ambulatory Visit (INDEPENDENT_AMBULATORY_CARE_PROVIDER_SITE_OTHER): Payer: Medicare Other

## 2019-10-28 VITALS — Ht 61.0 in | Wt 161.0 lb

## 2019-10-28 DIAGNOSIS — Z0000 Encounter for general adult medical examination without abnormal findings: Secondary | ICD-10-CM

## 2019-10-28 NOTE — Patient Instructions (Addendum)
Tracy Lawrence , Thank you for taking time to come for your Medicare Wellness Visit. I appreciate your ongoing commitment to your health goals. Please review the following plan we discussed and let me know if I can assist you in the future.   These are the goals we discussed: Goals      Patient Stated   .  Increase physical activity (pt-stated)      I would like to walk more for exercise.        This is a list of the screening recommended for you and due dates:  Health Maintenance  Topic Date Due  .  Hepatitis C: One time screening is recommended by Center for Disease Control  (CDC) for  adults born from [birth year] through 1963.   Never done  . Complete foot exam   Never done  . Tetanus Vaccine  Never done  . Pneumonia vaccines (1 of 2 - PCV13) Never done  . Hemoglobin A1C  09/12/2019  . Flu Shot  11/01/2019*  . Eye exam for diabetics  12/02/2019  . Colon Cancer Screening  06/14/2022  . DEXA scan (bone density measurement)  Completed  . COVID-19 Vaccine  Completed  *Topic was postponed. The date shown is not the original due date.    Immunizations Immunization History  Administered Date(s) Administered  . PFIZER SARS-COV-2 Vaccination 03/07/2019, 03/28/2019   Keep all routine maintenance appointments.   Follow up 12/09/19 @ 2:00  Advanced directives: End of life planning; Advance aging; Advanced directives discussed.  Copy of current HCPOA/Living Will requested.    Conditions/risks identified: none new.  Follow up in one year for your annual wellness visit    Preventive Care 65 Years and Older, Female Preventive care refers to lifestyle choices and visits with your health care provider that can promote health and wellness. What does preventive care include?  A yearly physical exam. This is also called an annual well check.  Dental exams once or twice a year.  Routine eye exams. Ask your health care provider how often you should have your eyes checked.  Personal lifestyle  choices, including:  Daily care of your teeth and gums.  Regular physical activity.  Eating a healthy diet.  Avoiding tobacco and drug use.  Limiting alcohol use.  Practicing safe sex.  Taking low-dose aspirin every day.  Taking vitamin and mineral supplements as recommended by your health care provider. What happens during an annual well check? The services and screenings done by your health care provider during your annual well check will depend on your age, overall health, lifestyle risk factors, and family history of disease. Counseling  Your health care provider may ask you questions about your:  Alcohol use.  Tobacco use.  Drug use.  Emotional well-being.  Home and relationship well-being.  Sexual activity.  Eating habits.  History of falls.  Memory and ability to understand (cognition).  Work and work Statistician.  Reproductive health. Screening  You may have the following tests or measurements:  Height, weight, and BMI.  Blood pressure.  Lipid and cholesterol levels. These may be checked every 5 years, or more frequently if you are over 59 years old.  Skin check.  Lung cancer screening. You may have this screening every year starting at age 21 if you have a 30-pack-year history of smoking and currently smoke or have quit within the past 15 years.  Fecal occult blood test (FOBT) of the stool. You may have this test every year starting at  age 32.  Flexible sigmoidoscopy or colonoscopy. You may have a sigmoidoscopy every 5 years or a colonoscopy every 10 years starting at age 45.  Hepatitis C blood test.  Hepatitis B blood test.  Sexually transmitted disease (STD) testing.  Diabetes screening. This is done by checking your blood sugar (glucose) after you have not eaten for a while (fasting). You may have this done every 1-3 years.  Bone density scan. This is done to screen for osteoporosis. You may have this done starting at age 55.   Mammogram. This may be done every 1-2 years. Talk to your health care provider about how often you should have regular mammograms. Talk with your health care provider about your test results, treatment options, and if necessary, the need for more tests. Vaccines  Your health care provider may recommend certain vaccines, such as:  Influenza vaccine. This is recommended every year.  Tetanus, diphtheria, and acellular pertussis (Tdap, Td) vaccine. You may need a Td booster every 10 years.  Zoster vaccine. You may need this after age 65.  Pneumococcal 13-valent conjugate (PCV13) vaccine. One dose is recommended after age 33.  Pneumococcal polysaccharide (PPSV23) vaccine. One dose is recommended after age 66. Talk to your health care provider about which screenings and vaccines you need and how often you need them. This information is not intended to replace advice given to you by your health care provider. Make sure you discuss any questions you have with your health care provider. Document Released: 02/13/2015 Document Revised: 10/07/2015 Document Reviewed: 11/18/2014 Elsevier Interactive Patient Education  2017 [place] Prevention in the Home Falls can cause injuries. They can happen to people of all ages. There are many things you can do to make your home safe and to help prevent falls. What can I do on the outside of my home?  Regularly fix the edges of walkways and driveways and fix any cracks.  Remove anything that might make you trip as you walk through a door, such as a raised step or threshold.  Trim any bushes or trees on the path to your home.  Use bright outdoor lighting.  Clear any walking paths of anything that might make someone trip, such as rocks or tools.  Regularly check to see if handrails are loose or broken. Make sure that both sides of any steps have handrails.  Any raised decks and porches should have guardrails on the edges.  Have any leaves,  snow, or ice cleared regularly.  Use sand or salt on walking paths during winter.  Clean up any spills in your garage right away. This includes oil or grease spills. What can I do in the bathroom?  Use night lights.  Install grab bars by the toilet and in the tub and shower. Do not use towel bars as grab bars.  Use non-skid mats or decals in the tub or shower.  If you need to sit down in the shower, use a plastic, non-slip stool.  Keep the floor dry. Clean up any water that spills on the floor as soon as it happens.  Remove soap buildup in the tub or shower regularly.  Attach bath mats securely with double-sided non-slip rug tape.  Do not have throw rugs and other things on the floor that can make you trip. What can I do in the bedroom?  Use night lights.  Make sure that you have a light by your bed that is easy to reach.  Do not use any  sheets or blankets that are too big for your bed. They should not hang down onto the floor.  Have a firm chair that has side arms. You can use this for support while you get dressed.  Do not have throw rugs and other things on the floor that can make you trip. What can I do in the kitchen?  Clean up any spills right away.  Avoid walking on wet floors.  Keep items that you use a lot in easy-to-reach places.  If you need to reach something above you, use a strong step stool that has a grab bar.  Keep electrical cords out of the way.  Do not use floor polish or wax that makes floors slippery. If you must use wax, use non-skid floor wax.  Do not have throw rugs and other things on the floor that can make you trip. What can I do with my stairs?  Do not leave any items on the stairs.  Make sure that there are handrails on both sides of the stairs and use them. Fix handrails that are broken or loose. Make sure that handrails are as long as the stairways.  Check any carpeting to make sure that it is firmly attached to the stairs. Fix any  carpet that is loose or worn.  Avoid having throw rugs at the top or bottom of the stairs. If you do have throw rugs, attach them to the floor with carpet tape.  Make sure that you have a light switch at the top of the stairs and the bottom of the stairs. If you do not have them, ask someone to add them for you. What else can I do to help prevent falls?  Wear shoes that:  Do not have high heels.  Have rubber bottoms.  Are comfortable and fit you well.  Are closed at the toe. Do not wear sandals.  If you use a stepladder:  Make sure that it is fully opened. Do not climb a closed stepladder.  Make sure that both sides of the stepladder are locked into place.  Ask someone to hold it for you, if possible.  Clearly mark and make sure that you can see:  Any grab bars or handrails.  First and last steps.  Where the edge of each step is.  Use tools that help you move around (mobility aids) if they are needed. These include:  Canes.  Walkers.  Scooters.  Crutches.  Turn on the lights when you go into a dark area. Replace any light bulbs as soon as they burn out.  Set up your furniture so you have a clear path. Avoid moving your furniture around.  If any of your floors are uneven, fix them.  If there are any pets around you, be aware of where they are.  Review your medicines with your doctor. Some medicines can make you feel dizzy. This can increase your chance of falling. Ask your doctor what other things that you can do to help prevent falls. This information is not intended to replace advice given to you by your health care provider. Make sure you discuss any questions you have with your health care provider. Document Released: 11/13/2008 Document Revised: 06/25/2015 Document Reviewed: 02/21/2014 Elsevier Interactive Patient Education  2017 [employer].

## 2019-10-28 NOTE — Progress Notes (Signed)
Subjective:   Tracy Lawrence is a 79 y.o. female who presents for Medicare Annual (Subsequent) preventive examination.  Review of Systems    No ROS.  Medicare Wellness Virtual Visit.   Cardiac Risk Factors include: advanced age (>66men, >29 women);hypertension     Objective:    Today's Vitals   10/28/19 1303  Weight: 161 lb (73 kg)  Height: 5\' 1"  (1.549 m)   Body mass index is 30.42 kg/m.  Advanced Directives 10/28/2019 10/14/2019 06/14/2019 03/18/2019 10/17/2018  Does Patient Have a Medical Advance Directive? Yes Yes Yes Yes Yes  Type of Advance Directive - Living will [place];Living will [place];Living will Living will;Healthcare Power of Attorney  Does patient want to make changes to medical advance directive? No - Patient declined No - Patient declined - - No - Patient declined  Copy of [place] in Chart? - - No - copy requested - No - copy requested    Current Medications (verified) Outpatient Encounter Medications as of 10/28/2019  Medication Sig  . allopurinol (ZYLOPRIM) 100 MG tablet Take 1 tablet (100 mg total) by mouth daily.  . Calcium Carbonate (CALCIUM 500 PO) Take 1 tablet by mouth every other day.  . escitalopram (LEXAPRO) 10 MG tablet TAKE 1 TABLET BY MOUTH EVERY DAY FOR DEPRESSION  . losartan (COZAAR) 50 MG tablet Take 1 tablet (50 mg total) by mouth at bedtime.  . Multiple Vitamins-Minerals (EYE VITAMINS PO) Take by mouth.  . rosuvastatin (CRESTOR) 40 MG tablet TAKE 1 TABLET BY MOUTH EVERY DAY FOR CHOLESTEROL   No facility-administered encounter medications on file as of 10/28/2019.    Allergies (verified) Patient has no known allergies.   History: Past Medical History:  Diagnosis Date  . Anemia   . Breast cancer [facility]) 2002   right breast  . Breast cancer ([place]) 2011   left breast  . Cancer ([place])   . Family history of breast cancer   . Family history of lung cancer   . Family history of  non-Hodgkin's lymphoma   . Family history of prostate cancer   . Hypertension   . Malignant neoplasm of right breast ([place])   . Personal history of radiation therapy 2002, 2011   right and left breast  . Pre-diabetes    Past Surgical History:  Procedure Laterality Date  . ABDOMINAL HYSTERECTOMY  2018  . BREAST BIOPSY Right 10/04/2018   u/s bx, vision marker, path pending  . BREAST LUMPECTOMY Right 2002   breast ca with rad  . BREAST LUMPECTOMY Left 2011   breast ca with rad  . BREAST SURGERY  2011,2002  . CATARACT EXTRACTION  2015  . COLONOSCOPY    . COLONOSCOPY WITH PROPOFOL N/A 06/14/2019   Procedure: COLONOSCOPY WITH PROPOFOL;  Surgeon: Lin Landsman, MD;  Location: [facility] ENDOSCOPY;  Service: Gastroenterology;  Laterality: N/A;  . ESOPHAGOGASTRODUODENOSCOPY (EGD) WITH PROPOFOL N/A 06/14/2019   Procedure: ESOPHAGOGASTRODUODENOSCOPY (EGD) WITH PROPOFOL;  Surgeon: Lin Landsman, MD;  Location: [facility] ENDOSCOPY;  Service: Gastroenterology;  Laterality: N/A;   Family History  Problem Relation Age of Onset  . Heart attack Mother   . Cancer Father   . Lung cancer Father   . Breast cancer Sister 57       dx again 66  . Non-Hodgkin's lymphoma Sister 63  . Dementia Sister 19  . Breast cancer Paternal Aunt   . Breast cancer Maternal Aunt   . Prostate cancer Paternal Grandfather  metastic, d. 44s  . Colon cancer Neg Hx    Social History   Socioeconomic History  . Marital status: Married    Spouse name: Not on file  . Number of children: Not on file  . Years of education: Not on file  . Highest education level: Not on file  Occupational History  . Not on file  Tobacco Use  . Smoking status: Former Smoker    Packs/day: 1.00    Years: 12.00    Pack years: 12.00    Quit date: 03/18/1979    Years since quitting: 40.6  . Smokeless tobacco: Never Used  . Tobacco comment: quit 40 years ago  Vaping Use  . Vaping Use: Never used  Substance and Sexual Activity  .  Alcohol use: Yes    Comment: rarely  . Drug use: Never  . Sexual activity: Not on file  Other Topics Concern  . Not on file  Social History Narrative   From [place]      Retired      Social Determinants of Radio broadcast assistant Strain: [place]   . Difficulty of Paying Living Expenses: Not hard at all  Food Insecurity: No Food Insecurity  . Worried About Charity fundraiser in the Last Year: Never true  . Ran Out of Food in the Last Year: Never true  Transportation Needs: No Transportation Needs  . Lack of Transportation (Medical): No  . Lack of Transportation (Non-Medical): No  Physical Activity:   . Days of Exercise per Week: Not on file  . Minutes of Exercise per Session: Not on file  Stress: No Stress Concern Present  . Feeling of Stress : Not at all  Social Connections: Unknown  . Frequency of Communication with Friends and Family: Not on file  . Frequency of Social Gatherings with Friends and Family: Not on file  . Attends Religious Services: Not on file  . Active Member of Clubs or Organizations: Not on file  . Attends Archivist Meetings: Not on file  . Marital Status: Married    Tobacco Counseling Counseling given: Not Answered Comment: quit 40 years ago   Clinical Intake:  Pre-visit preparation completed: Yes        Diabetes: No  How often do you need to have someone help you when you read instructions, pamphlets, or other written materials from your doctor or pharmacy?: 1 - Never       Activities of Daily Living In your present state of health, do you have any difficulty performing the following activities: 10/28/2019  Hearing? N  Vision? N  Difficulty concentrating or making decisions? N  Walking or climbing stairs? N  Dressing or bathing? N  Doing errands, shopping? N  Preparing Food and eating ? N  Using the Toilet? N  In the past six months, have you accidently leaked urine? N  Do you have problems with loss of bowel  control? N  Managing your Medications? N  Managing your Finances? N  Housekeeping or managing your Housekeeping? N  Some recent data might be hidden    Patient Care Team: Burnard Hawthorne, FNP as PCP - General (Family Medicine) Theodore Demark, RN as Oncology Nurse Navigator (Oncology)  Indicate any recent Medical Services you may have received from other than [facility] providers in the past year (date may be approximate).     Assessment:   This is a routine wellness examination for Jeniece.  I connected with Nunzio Cory  today by telephone and verified that I am speaking with the correct person using two identifiers. Location patient: home Location provider: work Persons participating in the virtual visit: patient, Marine scientist.    I discussed the limitations, risks, security and privacy concerns of performing an evaluation and management service by telephone and the availability of in person appointments. The patient expressed understanding and verbally consented to this telephonic visit.    Interactive audio and video telecommunications were attempted between this provider and patient, however failed, due to patient having technical difficulties OR patient did not have access to video capability.  We continued and completed visit with audio only.  Some vital signs may be absent or patient reported.   Patient notes blood pressure has improved.   Hearing/Vision screen  Hearing Screening   125Hz  250Hz  500Hz  1000Hz  2000Hz  3000Hz  4000Hz  6000Hz  8000Hz   Right ear:           Left ear:           Comments: Patient is able to hear conversational tones without difficulty.  No issues reported.   Vision Screening Comments: Wears corrective lenses Visual acuity not assessed, virtual visit.     Dietary issues and exercise activities discussed: Current Exercise Habits: Home exercise routine, Type of exercise: walking Healthy diet Good water intake.  Goals      Patient Stated   .  Increase  physical activity (pt-stated)      I would like to walk more for exercise.       Depression Screen PHQ 2/9 Scores 10/28/2019 09/27/2019 06/12/2019 01/28/2019 10/17/2018 06/29/2018  PHQ - 2 Score 0 0 0 0 0 0  PHQ- 9 Score 0 1 1 1  - 0    Fall Risk Fall Risk  10/28/2019 10/17/2018 07/25/2018 06/29/2018  Falls in the past year? 0 0 0 1  Number falls in past yr: 0 - - 0  Injury with Fall? - - - 0  Follow up Falls evaluation completed - - Falls evaluation completed   Handrails in use when climbing stairs? Yes Home free of loose throw rugs in walkways, pet beds, electrical cords, etc? Yes  Adequate lighting in your home to reduce risk of falls? Yes   ASSISTIVE DEVICES UTILIZED TO PREVENT FALLS: Use of a cane, walker or w/c? No   TIMED UP AND GO:  Was the test performed? No . Virtual visit.   Cognitive Function: Patient is alert and oriented x3.  Denies difficulty focusing, making decisions, memory loss.  Enjoys completing brain challenging activities like crossword puzzles, reading. Manages household finances.      6CIT Screen 10/17/2018  What Year? 0 points  What month? 0 points  What time? 0 points  Count back from 20 0 points  Months in reverse 0 points  Repeat phrase 0 points  Total Score 0    Immunizations Immunization History  Administered Date(s) Administered  . PFIZER SARS-COV-2 Vaccination 03/07/2019, 03/28/2019   Health Maintenance Health Maintenance  Topic Date Due  . Hepatitis C Screening  Never done  . FOOT EXAM  Never done  . TETANUS/TDAP  Never done  . PNA vac Low Risk Adult (1 of 2 - PCV13) Never done  . HEMOGLOBIN A1C  09/12/2019  . INFLUENZA VACCINE  11/01/2019 (Originally 09/01/2019)  . OPHTHALMOLOGY EXAM  12/02/2019  . COLONOSCOPY  06/14/2022  . DEXA SCAN  Completed  . COVID-19 Vaccine  Completed     Dental Screening: Recommended annual dental exams for proper oral hygiene  Community Resource Referral / Chronic Care Management: CRR required this  visit?  No   CCM required this visit?  No      Plan:    Keep all routine maintenance appointments.   Follow up 12/09/19 @ 2:00  I have personally reviewed and noted the following in the patient's chart:   . Medical and social history . Use of alcohol, tobacco or illicit drugs  . Current medications and supplements . Functional ability and status . Nutritional status . Physical activity . Advanced directives . List of other physicians . Hospitalizations, surgeries, and ER visits in previous 12 months . Vitals . Screenings to include cognitive, depression, and falls . Referrals and appointments  In addition, I have reviewed and discussed with patient certain preventive protocols, quality metrics, and best practice recommendations. A written personalized care plan for preventive services as well as general preventive health recommendations were provided to patient via mychart.     Varney Biles, LPN   [id number]

## 2019-11-11 ENCOUNTER — Telehealth: Payer: Self-pay | Admitting: Licensed Clinical Social Worker

## 2019-11-11 NOTE — Telephone Encounter (Signed)
Revealed negative genetic testing on the [facility] Breast Cancer STAT Panel, these genes have highest breast cancer risks and can be used to help with surgical decisions. The remainder of testing is still pending but we will call Ms. Stubblefield as soon as it is back.

## 2019-11-20 ENCOUNTER — Ambulatory Visit: Payer: Self-pay | Admitting: Licensed Clinical Social Worker

## 2019-11-20 ENCOUNTER — Telehealth: Payer: Self-pay | Admitting: Licensed Clinical Social Worker

## 2019-11-20 ENCOUNTER — Encounter: Payer: Self-pay | Admitting: Licensed Clinical Social Worker

## 2019-11-20 DIAGNOSIS — Z1379 Encounter for other screening for genetic and chromosomal anomalies: Secondary | ICD-10-CM | POA: Insufficient documentation

## 2019-11-20 DIAGNOSIS — C50911 Malignant neoplasm of unspecified site of right female breast: Secondary | ICD-10-CM

## 2019-11-20 DIAGNOSIS — Z807 Family history of other malignant neoplasms of lymphoid, hematopoietic and related tissues: Secondary | ICD-10-CM

## 2019-11-20 DIAGNOSIS — Z803 Family history of malignant neoplasm of breast: Secondary | ICD-10-CM

## 2019-11-20 DIAGNOSIS — Z801 Family history of malignant neoplasm of trachea, bronchus and lung: Secondary | ICD-10-CM

## 2019-11-20 DIAGNOSIS — Z8042 Family history of malignant neoplasm of prostate: Secondary | ICD-10-CM

## 2019-11-20 NOTE — Progress Notes (Signed)
HPI:  Tracy Lawrence was previously seen in the [place] clinic due to a personal and family history of cancer and concerns regarding a hereditary predisposition to cancer. Please refer to our prior cancer genetics clinic note for more information regarding our discussion, assessment and recommendations, at the time. [name] recent genetic test results were disclosed to her, as were recommendations warranted by these results. These results and recommendations are discussed in more detail below.  CANCER HISTORY:  Oncology History   No history exists.    FAMILY HISTORY:  We obtained a detailed, 4-generation family history.  Significant diagnoses are listed below: Family History  Problem Relation Age of Onset  . Heart attack Mother   . Cancer Father   . Lung cancer Father   . Breast cancer Sister 69       dx again 107  . Non-Hodgkin's lymphoma Sister 50  . Dementia Sister 60  . Breast cancer Paternal Aunt   . Breast cancer Maternal Aunt   . Prostate cancer Paternal Grandfather        metastic, d. 2s  . Colon cancer Neg Hx     Tracy Lawrence had a son and a daughter, her son passed away from diabetes complications. Patient had 3 sisters, 1 brother, and 2 paternal half sisters. One of her sisters had breast cancer at 37, again at 3 and died at 84. Another sister had non-Hodgkin's lymphoma and died at 66.  Tracy Lawrence mother died at 36 due to heart issues. Patient had 4 maternal aunts, 2 maternal uncles. One aunt had breast cancer in her 71s. No known cancers in maternal cousins. Maternal grandmother passed at 37, grandfather passed over age 47.   Tracy Lawrence father had lung cancer due to exposure to radiation at his job. Patient had 4 paternal aunts, 1 uncle. One of her aunts had breast cancer diagnosed over age 19. No known cancers in paternal cousins. Paternal grandfather had prostate cancer that was metastatic, he passed in his 36s. No cancer for paternal grandmother.    Tracy Lawrence is aware of previous family history of genetic testing for hereditary cancer risks. Patient's ancestors are of [place], [place], and unknown descent. There is no reported Ashkenazi Jewish ancestry. There is no known consanguinity.  GENETIC TEST RESULTS: Genetic testing reported out on 11/19/2019 through the [facility] Breast Cancer STAT Panel + Multi- cancer panel found no pathogenic mutations.   The STAT Breast cancer panel offered by [facility] includes sequencing and rearrangement analysis for the following 9 genes:  ATM, BRCA1, BRCA2, CDH1, CHEK2, PALB2, PTEN, STK11 and TP53.   The Multi-Cancer Panel offered by [facility] includes sequencing and/or deletion duplication testing of the following 85 genes: AIP, ALK, APC, ATM, AXIN2,BAP1,  BARD1, BLM, BMPR1A, BRCA1, BRCA2, BRIP1, CASR, CDC73, CDH1, CDK4, CDKN1B, CDKN1C, CDKN2A (p14ARF), CDKN2A (p16INK4a), CEBPA, CHEK2, CTNNA1, DICER1, DIS3L2, EGFR (c.2369C>T, p.Thr790Met variant only), EPCAM (Deletion/duplication testing only), FH, FLCN, GATA2, GPC3, GREM1 (Promoter region deletion/duplication testing only), HOXB13 (c.251G>A, p.Gly84Glu), HRAS, KIT, MAX, MEN1, MET, MITF (c.952G>A, p.Glu318Lys variant only), MLH1, MSH2, MSH3, MSH6, MUTYH, NBN, NF1, NF2, NTHL1, PALB2, PDGFRA, PHOX2B, PMS2, POLD1, POLE, POT1, PRKAR1A, PTCH1, PTEN, RAD50, RAD51C, RAD51D, RB1, RECQL4, RET, RNF43, RUNX1, SDHAF2, SDHA (sequence changes only), SDHB, SDHC, SDHD, SMAD4, SMARCA4, SMARCB1, SMARCE1, STK11, SUFU, TERC, TERT, TMEM127, TP53, TSC1, TSC2, VHL, WRN and WT1.   The test report has been scanned into EPIC and is located under the Molecular Pathology section of the Results Review tab.  A portion of the result report is included below for reference.    We discussed with Tracy Lawrence that because current genetic testing is not perfect, it is possible there may be a gene mutation in one of these genes that current testing cannot detect, but that chance is small.  We also  discussed, that there could be another gene that has not yet been discovered, or that we have not yet tested, that is responsible for the cancer diagnoses in the family. It is also possible there is a hereditary cause for the cancer in the family that Tracy Lawrence did not inherit and therefore was not identified in her testing.  Therefore, it is important to remain in touch with cancer genetics in the future so that we can continue to offer Tracy Lawrence the most up to date genetic testing.   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  At this time, it is unknown if this variant is associated with increased cancer risk or if this is a normal finding, but most variants such as this get reclassified to being inconsequential. It should not be used to make medical management decisions. With time, we suspect the lab will determine the significance of this variant, if any. If we do learn more about it, we will try to contact [name] to discuss it further. However, it is important to stay in touch with [place] periodically and keep the address and phone number up to date.  ADDITIONAL GENETIC TESTING: We discussed with Tracy Lawrence that her genetic testing was fairly extensive.  If there are genes identified to increase cancer risk that can be analyzed in the future, we would be happy to discuss and coordinate this testing at that time.    CANCER SCREENING RECOMMENDATIONS: Tracy Lawrence test result is considered negative (normal).  This means that we have not identified a hereditary cause for her  personal and family history of cancer at this time. Most cancers happen by chance and this negative test suggests that her cancer may fall into this category.    While reassuring, this does not definitively rule out a hereditary predisposition to cancer. It is still possible that there could be genetic mutations that are undetectable by current technology. There could be  genetic mutations in genes that have not been tested or identified to increase cancer risk.  Therefore, it is recommended she continue to follow the cancer management and screening guidelines provided by her oncology and primary healthcare provider.   An individual's cancer risk and medical management are not determined by genetic test results alone. Overall cancer risk assessment incorporates additional factors, including personal medical history, family history, and any available genetic information that may result in a personalized plan for cancer prevention and surveillance.  RECOMMENDATIONS FOR FAMILY MEMBERS:  Relatives in this family might be at some increased risk of developing cancer, over the general population risk, simply due to the family history of cancer.  We recommended female relatives in this family have a yearly mammogram beginning at age 56, or 89 years younger than the earliest onset of cancer, an annual clinical breast exam, and perform monthly breast self-exams. Female relatives in this family should also have a gynecological exam as recommended by their primary provider.  All family members should be referred for colonoscopy starting at age 32.    It is also possible there is a hereditary cause for the cancer in Ms. Stoltzfus's family  that she did not inherit and therefore was not identified in her.  Based on Ms. Klemann's family history, family members on both sides could consider having genetic counseling and testing. Ms. Sahlin will let us know if we can be of any assistance in coordinating genetic counseling and/or testing for these family members.  FOLLOW-UP: Lastly, we discussed with Ms. Martino that cancer genetics is a rapidly advancing field and it is possible that new genetic tests will be appropriate for her and/or her family members in the future. We encouraged her to remain in contact with cancer genetics on an annual basis so we can update her personal and family histories  and let her know of advances in cancer genetics that may benefit this family.   Our contact number was provided. Ms. Moehring questions were answered to her satisfaction, and she knows she is welcome to call us at anytime with additional questions or concerns.   Faith Rogue, MS, [facility] Genetic Counselor [email or web address] Phone: [phone]

## 2019-11-20 NOTE — Telephone Encounter (Signed)
Revealed negative genetic testing.  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 This normal result is reassuring and indicates that it is unlikely Tracy Lawrence's cancer is due to a hereditary cause.  It is unlikely that there is an increased risk of another cancer due to a mutation in one of these genes.  However, genetic testing is not perfect, and cannot definitively rule out a hereditary cause.  It will be important for her to keep in contact with genetics to learn if any additional testing may be needed in the future.

## 2019-11-28 ENCOUNTER — Other Ambulatory Visit: Payer: Self-pay | Admitting: General Surgery

## 2019-11-28 DIAGNOSIS — C50411 Malignant neoplasm of upper-outer quadrant of right female breast: Secondary | ICD-10-CM

## 2019-12-09 ENCOUNTER — Other Ambulatory Visit: Payer: Self-pay

## 2019-12-09 ENCOUNTER — Encounter: Payer: Self-pay | Admitting: Family

## 2019-12-09 ENCOUNTER — Ambulatory Visit (INDEPENDENT_AMBULATORY_CARE_PROVIDER_SITE_OTHER): Payer: Medicare Other | Admitting: Family

## 2019-12-09 VITALS — BP 122/70 | HR 102 | Wt 163.4 lb

## 2019-12-09 DIAGNOSIS — N183 Chronic kidney disease, stage 3 unspecified: Secondary | ICD-10-CM | POA: Diagnosis not present

## 2019-12-09 DIAGNOSIS — E1122 Type 2 diabetes mellitus with diabetic chronic kidney disease: Secondary | ICD-10-CM | POA: Diagnosis not present

## 2019-12-09 DIAGNOSIS — Z7982 Long term (current) use of aspirin: Secondary | ICD-10-CM | POA: Diagnosis not present

## 2019-12-09 DIAGNOSIS — F329 Major depressive disorder, single episode, unspecified: Secondary | ICD-10-CM

## 2019-12-09 DIAGNOSIS — F32A Depression, unspecified: Secondary | ICD-10-CM

## 2019-12-09 DIAGNOSIS — I10 Essential (primary) hypertension: Secondary | ICD-10-CM | POA: Diagnosis not present

## 2019-12-09 DIAGNOSIS — Z23 Encounter for immunization: Secondary | ICD-10-CM

## 2019-12-09 NOTE — Assessment & Plan Note (Signed)
Stable. Continue losartan 50mg .

## 2019-12-09 NOTE — Patient Instructions (Addendum)
As discussed, for now we will continue aspirin 81mg  with close attention to benefit versus risk.  Benefit being reduced risk of ischemic stroke, non fatal heart attack and colorectal cancer ( if used for 10 years). However these benefits are most apparent in adults aged 77 to 30 years with a ?10% 10-year cardiovasular risk . Persons who are not at increased risk for bleeding, have a life expectancy of at least 10 years, and are willing to take low-dose aspirin daily for at least 10 years are more likely to benefit. Persons who place a higher value on the potential benefits than the potential harms may choose to initiate low-dose aspirin.  At 70 years and above the risk of gastrointestinal bleeding, falls,  hemorrhagic stroke increases therefore becoming a very personal discussion in regards to whether you continue aspirin 81 mg.   For now , we have opted to continue however please bring to my attention at follow so we can continue to have this discussion.   Nice to see you!

## 2019-12-09 NOTE — Assessment & Plan Note (Signed)
Stable. Continue lexapro 10mg 

## 2019-12-09 NOTE — Assessment & Plan Note (Addendum)
discussed ASA use at length today. Due to age > 43 and h/o CKD, concerned with GI and affects on CKD. . Due to family h/o patient prefers to stay on ASA which I understand. She verbalized understanding of risks of medication. We will continue to monitor and discuss inherent risks.

## 2019-12-09 NOTE — Progress Notes (Signed)
Subjective:    Patient ID: Tracy Lawrence, female    DOB: 1940/11/09, 79 y.o.   MRN: [id number]  CC: Tracy Lawrence is a 79 y.o. female who presents today for follow up.   HPI: Feels well today No complaints  HTN- compliant with losartan and feels blood pressure improved.  No cp, sob.   Has taken ASA 81mg  for years and advised from prior PCP. No  H/o stent.   No h/o GIB. No colon cancer in the family.  Mother passed from MI. Sisters had MI's.  No falls.   Sees Dr Marla Roe next week.   depression- feels well on lexapro. Sleeps very well. Feels she is coping well with breast cancer diagnosis.   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 Last hemoglobin 11.6 ( prior 10.7)  Colonoscopy, EGD 06/2019 with Dr Marius Ditch   HISTORY:  Past Medical History:  Diagnosis Date  . Anemia   . Breast cancer [facility]) 2002   right breast  . Breast cancer ([place]) 2011   left breast  . Cancer ([place])   . Family history of breast cancer   . Family history of lung cancer   . Family history of non-Hodgkin's lymphoma   . Family history of prostate cancer   . Hypertension   . Malignant neoplasm of right breast ([place])   . Personal history of radiation therapy 2002, 2011   right and left breast  . Pre-diabetes    Past Surgical History:  Procedure Laterality Date  . ABDOMINAL HYSTERECTOMY  2018  . BREAST BIOPSY Right 10/04/2018   u/s bx, vision marker, path pending  . BREAST LUMPECTOMY Right 2002   breast ca with rad  . BREAST LUMPECTOMY Left 2011   breast ca with rad  . BREAST SURGERY  2011,2002  . CATARACT EXTRACTION  2015  . COLONOSCOPY    . COLONOSCOPY WITH PROPOFOL N/A 06/14/2019   Procedure: COLONOSCOPY WITH PROPOFOL;  Surgeon: Lin Landsman, MD;  Location: [facility] ENDOSCOPY;  Service: Gastroenterology;  Laterality: N/A;  . ESOPHAGOGASTRODUODENOSCOPY (EGD) WITH PROPOFOL N/A 06/14/2019   Procedure: ESOPHAGOGASTRODUODENOSCOPY (EGD) WITH PROPOFOL;  Surgeon: Lin Landsman, MD;  Location: [facility] ENDOSCOPY;  Service: Gastroenterology;  Laterality: N/A;   Family History  Problem Relation Age of Onset  . Heart attack Mother   . Cancer Father   . Lung cancer Father   . Breast cancer Sister 18       dx again 69  . Heart attack Sister   . Non-Hodgkin's lymphoma Sister 69  . Heart attack Sister   . Dementia Sister 4  . Breast cancer Paternal Aunt   . Breast cancer Maternal Aunt   . Prostate cancer Paternal Grandfather        metastic, d. 68s  . Colon cancer Neg Hx     Allergies: Patient has no known allergies. Current Outpatient Medications on File Prior to Visit  Medication Sig Dispense Refill  . allopurinol (ZYLOPRIM) 100 MG tablet Take 1 tablet (100 mg total) by mouth daily. 30 tablet 3  . aspirin 81 MG EC tablet Take 1 tablet by mouth daily.    . Calcium Carbonate (CALCIUM 500 PO) Take 1 tablet by mouth every other day.    Marland Kitchen  escitalopram (LEXAPRO) 10 MG tablet TAKE 1 TABLET BY MOUTH EVERY DAY FOR DEPRESSION 90 tablet 0  . losartan (COZAAR) 50 MG tablet Take 1 tablet (50 mg total) by mouth at bedtime. 90 tablet 1  . Multiple Vitamins-Minerals (EYE VITAMINS PO) Take by mouth.    . rosuvastatin (CRESTOR) 40 MG tablet TAKE 1 TABLET BY MOUTH EVERY DAY FOR CHOLESTEROL 90 tablet 1   No current facility-administered medications on file prior to visit.    Social History   Tobacco Use  . Smoking status: Former Smoker    Packs/day: 1.00    Years: 12.00    Pack years: 12.00    Quit date: 03/18/1979    Years since quitting: 40.7  . Smokeless tobacco: Never Used  . Tobacco comment: quit 40 years ago  Vaping Use  . Vaping Use: Never used  Substance Use Topics  . Alcohol use: Yes    Comment: rarely  . Drug use: Never     Review of Systems  Constitutional: Negative for chills and fever.  Respiratory: Negative for cough.   Cardiovascular: Negative for chest pain and palpitations.  Gastrointestinal: Negative for nausea and vomiting.  Psychiatric/Behavioral: Negative for sleep disturbance.      Objective:    BP 122/70   Pulse (!) 102   Wt 163 lb 6.4 oz (74.1 kg)   LMP  (LMP Unknown)   SpO2 98%   BMI 30.87 kg/m  BP Readings from Last 3 Encounters:  12/09/19 122/70  10/14/19 (!) 163/83  10/01/19 (!) 179/78   Wt Readings from Last 3 Encounters:  12/09/19 163 lb 6.4 oz (74.1 kg)  10/28/19 161 lb (73 kg)  10/14/19 161 lb (73 kg)    Physical Exam Vitals reviewed.  Constitutional:      Appearance: She is well-developed.  Eyes:     Conjunctiva/sclera: Conjunctivae normal.  Cardiovascular:     Rate and Rhythm: Normal rate and regular rhythm.     Pulses: Normal pulses.     Heart sounds: Normal heart sounds.  Pulmonary:     Effort: Pulmonary effort is normal.     Breath sounds: Normal breath sounds. No wheezing, rhonchi or rales.  Skin:    General: Skin is warm and dry.  Neurological:     Mental Status: She is alert.  Psychiatric:        Speech: Speech normal.        Behavior: Behavior normal.        Thought Content: Thought content normal.        Assessment & Plan:   Problem List Items Addressed This Visit      Cardiovascular and Mediastinum   Essential hypertension - Primary    Stable. Continue losartan 50mg .       Relevant Medications   aspirin 81 MG EC tablet     Endocrine   CKD stage 3 due to type 2 diabetes mellitus (HCC)   Relevant Medications   aspirin 81 MG EC tablet   Other Relevant Orders   Hemoglobin A1c   Uric acid     Other   Depressive disorder    Stable. Continue lexapro 10mg       Long term (current) use of aspirin     discussed ASA use at length today. Due to age > 40 and h/o CKD, concerned with GI and affects on CKD. . Due to family h/o patient  prefers to stay on ASA which I understand. She verbalized understanding of risks of medication. We  will continue to monitor and discuss inherent risks.       Relevant Medications   aspirin 81 MG EC tablet    Other Visit Diagnoses    Need for vaccination with 13-polyvalent pneumococcal conjugate vaccine       Relevant Orders   Pneumococcal conjugate vaccine 13-valent IM (Completed)       I am having Bjorn Loser. [place] maintain her Multiple Vitamins-Minerals (EYE VITAMINS PO), rosuvastatin, escitalopram, allopurinol, losartan, Calcium Carbonate (CALCIUM 500 PO), and aspirin.   No orders of the defined types were placed in this encounter.   Return precautions given.   Risks, benefits, and alternatives of the medications and treatment plan prescribed today were discussed, and patient expressed understanding.   Education regarding symptom management and diagnosis given to patient on AVS.  Continue to follow with Burnard Hawthorne, FNP for routine health maintenance.   Georgetta Haber and I agreed with plan.   Mable Paris, FNP

## 2019-12-10 ENCOUNTER — Encounter: Payer: Self-pay | Admitting: Plastic Surgery

## 2019-12-10 ENCOUNTER — Encounter: Payer: Self-pay | Admitting: Family

## 2019-12-10 ENCOUNTER — Ambulatory Visit (INDEPENDENT_AMBULATORY_CARE_PROVIDER_SITE_OTHER): Payer: Medicare Other | Admitting: Plastic Surgery

## 2019-12-10 ENCOUNTER — Other Ambulatory Visit: Payer: Self-pay

## 2019-12-10 VITALS — BP 125/78 | HR 89 | Temp 97.7°F | Ht 61.0 in | Wt 163.0 lb

## 2019-12-10 DIAGNOSIS — E119 Type 2 diabetes mellitus without complications: Secondary | ICD-10-CM | POA: Insufficient documentation

## 2019-12-10 DIAGNOSIS — C50411 Malignant neoplasm of upper-outer quadrant of right female breast: Secondary | ICD-10-CM

## 2019-12-10 DIAGNOSIS — T66XXXA Radiation sickness, unspecified, initial encounter: Secondary | ICD-10-CM

## 2019-12-10 LAB — URIC ACID: Uric Acid, Serum: 5.3 mg/dL (ref 2.4–7.0)

## 2019-12-10 LAB — HEMOGLOBIN A1C: Hgb A1c MFr Bld: 6.5 % (ref 4.6–6.5)

## 2019-12-10 NOTE — Progress Notes (Signed)
Patient ID: Tracy Lawrence, female    DOB: [birth year], 79 y.o.   MRN: [id number]   No chief complaint on file.   The patient is a 79 year old white female here with her husband for a consultation for breast reconstruction.  She has a newly diagnosed right breast cancer.  She had right breast cancer in 2002 treated with partial mastectomy, whole breast radiation and tamoxifen. She had left breast cancer in 2011 and was treated with partial mastectomy, MammoSite radiation and 5 years of aromatase inhibitor.  These treatments were all in [place].  She underwent a mammogram in August which led to concerns and a biopsy.  The pathology showed right breast upper outer quadrant invasive mammary carcinoma, grade 2 which was progesterone and estrogen positive, HER-2 negative.  She is 5 feet 1 inches tall and weighs 163 pounds.  Her preop bra size is a 38 C.  She has hypertension, hyperlipidemia and prediabetes.  She has had an abdominal hysterectomy, bilateral partial mastectomies and cataract surgery.  She quit smoking in the 1980s.  Her medications are listed below.  She had two children but one died recently from complications of diabetes and a heart attack.  Her daughter lives here here in [place].  She has been living in [place] for a year and a half after moving from [place].  She has 2 children 1 of which died recently from diabetes and a heart attack.  Her daughter lives in [place] which is why she moved here a year and a half ago.  She has significant breast asymmetry and tissue changes due to the radiation on both breasts.  She has grade 2-3 ptosis of her breasts bilaterally.   I reviewed the patient chart and confirmed all the information was correct with the patient.   Review of Systems  Constitutional: Negative.  Negative for activity change.  HENT: Negative.   Eyes: Negative.   Respiratory: Negative.  Negative for chest tightness.   Cardiovascular: Negative.  Negative  for leg swelling.  Gastrointestinal: Negative.  Negative for abdominal distention.  Endocrine: Negative.   Genitourinary: Negative.   Musculoskeletal: Negative.   Neurological: Negative.   Hematological: Negative.   Psychiatric/Behavioral: Negative.     Past Medical History:  Diagnosis Date  . Anemia   . Breast cancer [facility]) 2002   right breast  . Breast cancer ([place]) 2011   left breast  . Cancer ([place])   . Family history of breast cancer   . Family history of lung cancer   . Family history of non-Hodgkin's lymphoma   . Family history of prostate cancer   . Hypertension   . Malignant neoplasm of right breast ([place])   . Personal history of radiation therapy 2002, 2011   right and left breast  . Pre-diabetes     Past Surgical History:  Procedure Laterality Date  . ABDOMINAL HYSTERECTOMY  2018  . BREAST BIOPSY Right 10/04/2018   u/s bx, vision marker, path pending  . BREAST LUMPECTOMY Right 2002   breast ca with rad  . BREAST LUMPECTOMY Left 2011   breast ca with rad  . BREAST SURGERY  2011,2002  . CATARACT EXTRACTION  2015  . COLONOSCOPY    . COLONOSCOPY WITH PROPOFOL N/A 06/14/2019   Procedure: COLONOSCOPY WITH PROPOFOL;  Surgeon: Lin Landsman, MD;  Location: [facility] ENDOSCOPY;  Service: Gastroenterology;  Laterality: N/A;  . ESOPHAGOGASTRODUODENOSCOPY (EGD) WITH PROPOFOL N/A 06/14/2019   Procedure: ESOPHAGOGASTRODUODENOSCOPY (EGD) WITH PROPOFOL;  Surgeon: Lin Landsman, MD;  Location:  [facility] ENDOSCOPY;  Service: Gastroenterology;  Laterality: N/A;      Current Outpatient Medications:  .  allopurinol (ZYLOPRIM) 100 MG tablet, Take 1 tablet (100 mg total) by mouth daily., Disp: 30 tablet, Rfl: 3 .  aspirin 81 MG EC tablet, Take 1 tablet by mouth daily., Disp: , Rfl:  .  Calcium Carbonate (CALCIUM 500 PO), Take 1 tablet by mouth every other day., Disp: , Rfl:  .  escitalopram (LEXAPRO) 10 MG tablet, TAKE 1 TABLET BY MOUTH EVERY DAY FOR DEPRESSION, Disp: 90 tablet,  Rfl: 0 .  losartan (COZAAR) 50 MG tablet, Take 1 tablet (50 mg total) by mouth at bedtime., Disp: 90 tablet, Rfl: 1 .  Multiple Vitamins-Minerals (EYE VITAMINS PO), Take by mouth., Disp: , Rfl:  .  rosuvastatin (CRESTOR) 40 MG tablet, TAKE 1 TABLET BY MOUTH EVERY DAY FOR CHOLESTEROL, Disp: 90 tablet, Rfl: 1   Objective:   Vitals:   12/10/19 1400  BP: 125/78  Pulse: 89  Temp: 97.7 F (36.5 C)  SpO2: 100%    Physical Exam Vitals and nursing note reviewed.  Constitutional:      Appearance: Normal appearance.  HENT:     Head: Normocephalic and atraumatic.  Eyes:     Extraocular Movements: Extraocular movements intact.  Cardiovascular:     Rate and Rhythm: Normal rate.     Pulses: Normal pulses.  Pulmonary:     Effort: Pulmonary effort is normal.  Abdominal:     General: Abdomen is flat. There is no distension.  Skin:    General: Skin is warm.     Capillary Refill: Capillary refill takes less than 2 seconds.  Neurological:     General: No focal deficit present.     Mental Status: She is alert and oriented to person, place, and time.  Psychiatric:        Mood and Affect: Mood normal.        Behavior: Behavior normal.        Thought Content: Thought content normal.     Assessment & Plan:  Malignant neoplasm of upper-outer quadrant of right female breast, unspecified estrogen receptor status ([place])  Radiation effect, initial encounter  We had a detailed conversation about the patient's options for breast reconstruction. Several reconstruction options were explained to the patient.  It is important to remember that breast reconstruction is an optional procedure. Reconstruction often requires several stages of surgery and this means more than one operation.  The surgeries are often done several months apart.  The entire process from start to finish can take a year or more. The major goal of breast reconstruction is to look normal in clothing. There will always be scars and a  difference noticeable without clothes.  This is true for asymmetries where both breasts will not be identical.  Surgery may be needed or desired to the non-cancerous breast in order to achieve better symmetry and satisfactory results.  Regardless of the reconstructive method, there is always risks and the possibility that the procedure will fail or have complications.  This could required additional surgeries.    We discussed the available methods of breast reconstruction and included:  1. Tissue expander with Acellular dermal matrix followed by implant based reconstruction. This can be done as one surgery or multiple surgeries.  2. Autologous reconstruction can include using a muscle or tissue from another area of the body for the reconstruction.  3. Combined procedures like the latissismus dorsi  flaps that often uses the muscle with an expander or implant.  For each of the method discussed the risks, benefits, scars and recovery time were discussed in detail. Specific risks included bleeding, infection, hematoma, seroma, scarring, pain, wound healing complications, flap loss, fat necrosis, capsular contracture, need for implant removal, donor site complications, bulge, hernia, umbilical necrosis, need for urgent reoperation, and need for dressing changes.   After the options were discussed we focused on the patient's desires and the procedure that was best for her based on all the information.  A total of 50 minutes of face-to-face time was spent in this encounter, of which >60% was spent in counseling.  And an additional 20 to 30 minutes on reviewing the records, discussion with Dr. Tollie Pizza and charting.  After a thorough discussion the patient is leaning towards a repeat partial mastectomy of the right breast.  If she does this then she would just come and see me after she is healed from the partial mastectomy.  Her options would be for fat grafting, scar release and last resort latissimus muscle  flap.  I have reviewed the above information with Dr. Tollie Pizza and he is going to talk with radiation oncology and oncology regarding the patient's options.  Pictures were obtained of the patient and placed in the chart with the patient's or guardian's permission.  [place], DO

## 2019-12-11 ENCOUNTER — Other Ambulatory Visit: Payer: Self-pay | Admitting: General Surgery

## 2019-12-11 DIAGNOSIS — C50411 Malignant neoplasm of upper-outer quadrant of right female breast: Secondary | ICD-10-CM

## 2019-12-16 ENCOUNTER — Ambulatory Visit
Admission: RE | Admit: 2019-12-16 | Discharge: 2019-12-16 | Disposition: A | Discharge status: Home / Self Care | Payer: Medicare Other | Source: Ambulatory Visit | Attending: Radiation Oncology | Admitting: Radiation Oncology

## 2019-12-16 ENCOUNTER — Encounter: Payer: Self-pay | Admitting: Radiation Oncology

## 2019-12-16 VITALS — BP 118/82 | HR 70 | Temp 97.9°F | Wt 164.0 lb

## 2019-12-16 DIAGNOSIS — Z8042 Family history of malignant neoplasm of prostate: Secondary | ICD-10-CM | POA: Diagnosis not present

## 2019-12-16 DIAGNOSIS — Z170 Estrogen receptor positive status [ER+]: Secondary | ICD-10-CM | POA: Insufficient documentation

## 2019-12-16 DIAGNOSIS — Z801 Family history of malignant neoplasm of trachea, bronchus and lung: Secondary | ICD-10-CM | POA: Diagnosis not present

## 2019-12-16 DIAGNOSIS — Z87891 Personal history of nicotine dependence: Secondary | ICD-10-CM | POA: Insufficient documentation

## 2019-12-16 DIAGNOSIS — Z923 Personal history of irradiation: Secondary | ICD-10-CM | POA: Insufficient documentation

## 2019-12-16 DIAGNOSIS — C50411 Malignant neoplasm of upper-outer quadrant of right female breast: Secondary | ICD-10-CM | POA: Insufficient documentation

## 2019-12-16 DIAGNOSIS — Z806 Family history of leukemia: Secondary | ICD-10-CM | POA: Insufficient documentation

## 2019-12-16 DIAGNOSIS — Z7982 Long term (current) use of aspirin: Secondary | ICD-10-CM | POA: Insufficient documentation

## 2019-12-16 DIAGNOSIS — Z79899 Other long term (current) drug therapy: Secondary | ICD-10-CM | POA: Diagnosis not present

## 2019-12-16 NOTE — Consult Note (Signed)
NEW PATIENT EVALUATION  Name: Tracy Lawrence  MRN: [id number]  Date:   12/16/2019     DOB: 1940-06-25   This 79 y.o. female patient presents to the clinic for initial evaluation of recurrent breast cancer of the right breast.  REFERRING PHYSICIAN: Burnard Hawthorne, FNP  CHIEF COMPLAINT:  Chief Complaint  Patient presents with  . Consult    DIAGNOSIS: The encounter diagnosis was Malignant neoplasm of upper-outer quadrant of right female breast, unspecified estrogen receptor status ([place]).   PREVIOUS INVESTIGATIONS:  Mammograms and ultrasound reviewed Pathology report reviewed Clinical notes reviewed  HPI: Patient is a 79 year old female past medical history status post lumpectomy and whole breast radiation back in 2002 in [place].  She also had a left breast lumpectomy followed by adjuvant radiation therapy in 2011.  Left breast was accelerated partial breast irradiation.  She is recently moved here from [place] and on mammogram had a 0.7 x 0.6 cm mass in the right breast 10 o'clock position 5 cm from the nipple.  She underwent ultrasound-guided biopsy which was positive for a 1.5 mm invasive mammary carcinomaER/PR positive HER-2/neu not overexpressed.   She is seen today for radiation oncology opinion.  She is considering her treatment options as far as reexcision versus mastectomy.  She is otherwise doing well having no significant pain or discomfort in her breast.  PLANNED TREATMENT REGIMEN: Mastectomy  PAST MEDICAL HISTORY:  has a past medical history of Anemia, Breast cancer ([place]) (2002), Breast cancer ([place]) (2011), Cancer [facility]), Family history of breast cancer, Family history of lung cancer, Family history of non-Hodgkin's lymphoma, Family history of prostate cancer, Hypertension, Malignant neoplasm of right breast [facility]), Personal history of radiation therapy (2002, 2011), and Pre-diabetes.    PAST SURGICAL HISTORY:  Past Surgical History:  Procedure Laterality Date  .  ABDOMINAL HYSTERECTOMY  2018  . BREAST BIOPSY Right 10/04/2018   u/s bx, vision marker, path pending  . BREAST LUMPECTOMY Right 2002   breast ca with rad  . BREAST LUMPECTOMY Left 2011   breast ca with rad  . BREAST SURGERY  2011,2002  . CATARACT EXTRACTION  2015  . COLONOSCOPY    . COLONOSCOPY WITH PROPOFOL N/A 06/14/2019   Procedure: COLONOSCOPY WITH PROPOFOL;  Surgeon: Lin Landsman, MD;  Location: [facility] ENDOSCOPY;  Service: Gastroenterology;  Laterality: N/A;  . ESOPHAGOGASTRODUODENOSCOPY (EGD) WITH PROPOFOL N/A 06/14/2019   Procedure: ESOPHAGOGASTRODUODENOSCOPY (EGD) WITH PROPOFOL;  Surgeon: Lin Landsman, MD;  Location: [facility] ENDOSCOPY;  Service: Gastroenterology;  Laterality: N/A;    FAMILY HISTORY: family history includes Breast cancer in her maternal aunt and paternal aunt; Breast cancer (age of onset: 73) in her sister; Cancer in her father; Dementia (age of onset: 41) in her sister; Heart attack in her mother, sister, and sister; Lung cancer in her father; Non-Hodgkin's lymphoma (age of onset: 16) in her sister; Prostate cancer in her paternal grandfather.  SOCIAL HISTORY:  reports that she quit smoking about 40 years ago. She has a 12.00 pack-year smoking history. She has never used smokeless tobacco. She reports current alcohol use. She reports that she does not use drugs.  ALLERGIES: Patient has no known allergies.  MEDICATIONS:  Current Outpatient Medications  Medication Sig Dispense Refill  . allopurinol (ZYLOPRIM) 100 MG tablet Take 1 tablet (100 mg total) by mouth daily. 30 tablet 3  . aspirin 81 MG EC tablet Take 1 tablet by mouth daily.    . Calcium Carbonate (CALCIUM 500 PO) Take 1 tablet by  mouth every other day.    . escitalopram (LEXAPRO) 10 MG tablet TAKE 1 TABLET BY MOUTH EVERY DAY FOR DEPRESSION 90 tablet 0  . losartan (COZAAR) 50 MG tablet Take 1 tablet (50 mg total) by mouth at bedtime. 90 tablet 1  . Multiple Vitamins-Minerals (EYE VITAMINS PO) Take  by mouth.    . rosuvastatin (CRESTOR) 40 MG tablet TAKE 1 TABLET BY MOUTH EVERY DAY FOR CHOLESTEROL 90 tablet 1   No current facility-administered medications for this encounter.    ECOG PERFORMANCE STATUS:  0 - Asymptomatic  REVIEW OF SYSTEMS: Patient denies any weight loss, fatigue, weakness, fever, chills or night sweats. Patient denies any loss of vision, blurred vision. Patient denies any ringing  of the ears or hearing loss. No irregular heartbeat. Patient denies heart murmur or history of fainting. Patient denies any chest pain or pain radiating to her upper extremities. Patient denies any shortness of breath, difficulty breathing at night, cough or hemoptysis. Patient denies any swelling in the lower legs. Patient denies any nausea vomiting, vomiting of blood, or coffee ground material in the vomitus. Patient denies any stomach pain. Patient states has had normal bowel movements no significant constipation or diarrhea. Patient denies any dysuria, hematuria or significant nocturia. Patient denies any problems walking, swelling in the joints or loss of balance. Patient denies any skin changes, loss of hair or loss of weight. Patient denies any excessive worrying or anxiety or significant depression. Patient denies any problems with insomnia. Patient denies excessive thirst, polyuria, polydipsia. Patient denies any swollen glands, patient denies easy bruising or easy bleeding. Patient denies any recent infections, allergies or URI. Patient "s visual fields have not changed significantly in recent time.   PHYSICAL EXAM: BP 118/82   Pulse 70   Temp 97.9 F (36.6 C) (Tympanic)   Wt 164 lb (74.4 kg)   LMP  (LMP Unknown)   BMI 30.99 kg/m  Left breast shows firm lumpectomy cavity consistent with high-dose-rate remote afterloading treatment.  No dominant mass or nodularity is noted in the right breast.  No axillary or supraclavicular adenopathy is appreciated.  Well-developed well-nourished patient  in NAD. HEENT reveals PERLA, EOMI, discs not visualized.  Oral cavity is clear. No oral mucosal lesions are identified. Neck is clear without evidence of cervical or supraclavicular adenopathy. Lungs are clear to A&P. Cardiac examination is essentially unremarkable with regular rate and rhythm without murmur rub or thrill. Abdomen is benign with no organomegaly or masses noted. Motor sensory and DTR levels are equal and symmetric in the upper and lower extremities. Cranial nerves II through XII are grossly intact. Proprioception is intact. No peripheral adenopathy or edema is identified. No motor or sensory levels are noted. Crude visual fields are within normal range.  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  Whether she continues reconstruction will be discussed with her surgeon.  At 79 years old I believe the side effects from whole breast radiation or accelerated partial breast radiation would far outweigh the clinical benefit.  My strong belief is she would be best served by a total mastectomy.  I am referring her  back to Dr. Tollie Pizza for surgery.  I would like to take this opportunity to thank you for allowing me to participate in the care of your patient.Noreene Filbert, MD

## 2019-12-19 ENCOUNTER — Other Ambulatory Visit: Payer: Self-pay | Admitting: General Surgery

## 2019-12-19 NOTE — H&P (Deleted)
  The note originally documented on this encounter has been moved the the encounter in which it belongs.  

## 2019-12-19 NOTE — H&P (Signed)
Subjective:     Patient ID: Tracy Lawrence is a 79 y.o. female.  HPI  The following portions of the patient's history were reviewed and updated as appropriate.  This an established patient is here today for: office visit. The patient is here today to discuss options for breast cancer. The patient saw Dr. Baruch Gouty on 12-16-19. She has also seen Dr. Marla Roe since her last office visit here.   The patient is accompanied today by her husband, Tracy Lawrence.      Chief Complaint  Patient presents with  . Follow-up    breast cancer      BP 138/82   Pulse 80   Temp 36.5 C (97.7 F)   Ht 154.9 cm (5\' 1" )   Wt 73.5 kg (162 lb)   SpO2 98%   BMI 30.61 kg/m       Past Medical History:  Diagnosis Date  . Anemia   . Breast cancer (CMS-HCC) 2002, 2011   left breast 2011, right breast 2002  . History of radiation therapy   . Hypertension   . Pre-diabetes           Past Surgical History:  Procedure Laterality Date  . ABDOMINAL HYSTERECTOMY  2018  . CATARACT EXTRACTION  2015  . COLONOSCOPY  06/14/2019  . EGD  06/14/2019  . INCISIONAL BIOPSY BREAST Right 10/04/2018  . MASTECTOMY PARTIAL / LUMPECTOMY Left 2011   in [place] with mammosite  . MASTECTOMY PARTIAL / LUMPECTOMY Right 2002   in [place]              OB History    Gravida  2   Para  2   Term      Preterm      AB      Living        SAB      IAB      Ectopic      Molar      Multiple      Live Births          Obstetric Comments  Age at first period 38 Age of first pregnancy 55         Social History          Socioeconomic History  . Marital status: Unknown    Spouse name: Not on file  . Number of children: Not on file  . Years of education: Not on file  . Highest education level: Not on file  Occupational History  . Not on file  Tobacco Use  . Smoking status: Former Smoker    Packs/day: 1.00    Years: 12.00    Pack years: 12.00     Quit date: 03/18/1979    Years since quitting: 40.7  . Smokeless tobacco: Never Used  Substance and Sexual Activity  . Alcohol use: Yes    Comment: rare /wine  . Drug use: Never  . Sexual activity: Not on file  Other Topics Concern  . Not on file  Social History Narrative  . Not on file   Social Determinants of Health   Financial Resource Strain: Not on file  Food Insecurity: Not on file  Transportation Needs: Not on file       No Known Allergies  Current Medications        Current Outpatient Medications  Medication Sig Dispense Refill  . allopurinoL (ZYLOPRIM) 100 MG tablet once daily    . aspirin 81 MG EC tablet Take 81 mg by mouth  once daily    . calcium carbonate-vitamin D3 (CALTRATE 600+D) 600 mg(1,500mg ) -400 unit Chew chewable tablet Take by mouth once daily    . losartan (COZAAR) 50 MG tablet once daily    . simvastatin (ZOCOR) 80 MG tablet Take 80 mg by mouth nightly     No current facility-administered medications for this visit.           Family History  Problem Relation Age of Onset  . Myocardial Infarction (Heart attack) Mother   . Lung cancer Father   . Breast cancer Sister 48  . Lymphoma Sister        Non-Hodgkin's  . Dementia Sister   . Breast cancer Paternal Aunt 21  . Breast cancer Cousin 83       maternal  . Colon cancer Neg Hx      Review of Systems  Constitutional: Negative for chills and fever.  Respiratory: Negative for cough.        Objective:   Physical Exam Exam conducted with a chaperone present.  Constitutional:      Appearance: Normal appearance.  Cardiovascular:     Rate and Rhythm: Normal rate and regular rhythm.     Pulses: Normal pulses.     Heart sounds: Normal heart sounds.  Pulmonary:     Effort: Pulmonary effort is normal.     Breath sounds: Normal breath sounds.  Musculoskeletal:     Cervical back: Neck supple.  Skin:    General: Skin is warm and dry.  Neurological:      Mental Status: She is alert and oriented to person, place, and time.  Psychiatric:        Mood and Affect: Mood normal.        Behavior: Behavior normal.    Labs and Radiology:   Radiation oncology note of December 16, 2019 reviewed:  Recommendation for mastectomy to avoid additional radiation to the right chest wall.    Assessment:     New right breast cancer about 19 years after original tumor.    Plan:     We spent the majority of the visit reviewing options for management.  This included 1) mastectomy without reconstruction; 2) wide excision with plastic reconstruction to improve volume omitting post surgical radiation; 3) mastectomy with flap reconstruction.  Pros and cons of all these options were reviewed in detail.  She is troubled by the asymmetry, and she reports she be troubled if she had no breast.  Reviewing the concerns she has about a latissimus flap (apparently a family member had a disastrous outcome) and the likelihood to get inadequate volume without a even a larger flap and contralateral symmetry surgery in the face of radiation in both fields is discouraging.  At the end of the day's visit, she was comfortable with the idea of proceeding to mastectomy without reconstruction.  She is scheduled to speak with plastic surgery tomorrow and she is certainly welcome to contact the office if she has additional questions or desires to change her treatment plan. Patient to be scheduled for a right mastectomy with SLN biopsy for 01-06-20. It is okay for patient to continue an 81 mg aspirin once daily,     Entered by Ledell Noss, CMA, acting as a scribe for Dr. Hervey Ard, MD.   The documentation recorded by the scribe accurately reflects the service I personally performed and the decisions made by me.   Robert Bellow, MD FACS

## 2019-12-20 ENCOUNTER — Telehealth: Payer: Medicare Other | Admitting: Plastic Surgery

## 2019-12-20 ENCOUNTER — Other Ambulatory Visit: Payer: Self-pay

## 2019-12-21 ENCOUNTER — Other Ambulatory Visit: Payer: Self-pay | Admitting: Family

## 2019-12-24 ENCOUNTER — Other Ambulatory Visit: Payer: Self-pay | Admitting: General Surgery

## 2019-12-24 DIAGNOSIS — C50411 Malignant neoplasm of upper-outer quadrant of right female breast: Secondary | ICD-10-CM

## 2019-12-30 ENCOUNTER — Other Ambulatory Visit: Payer: Self-pay

## 2019-12-30 ENCOUNTER — Other Ambulatory Visit
Admission: RE | Admit: 2019-12-30 | Discharge: 2019-12-30 | Disposition: A | Discharge status: Home / Self Care | Payer: Medicare Other | Source: Ambulatory Visit | Attending: General Surgery | Admitting: General Surgery

## 2019-12-30 ENCOUNTER — Ambulatory Visit: Payer: Medicare Other | Admitting: Family

## 2019-12-30 HISTORY — DX: Unspecified osteoarthritis, unspecified site: M19.90

## 2019-12-30 NOTE — Patient Instructions (Signed)
Your procedure is scheduled on: Monday January 06, 2020. Report to Day Surgery inside [facility] 2nd floor (stop by Registration desk first). To find out your arrival time please call [phone] between 1PM - 3PM on Friday January 03, 2020.  Remember: Instructions that are not followed completely may result in serious medical risk,  up to and including death, or upon the discretion of your surgeon and anesthesiologist your  surgery may need to be rescheduled.     _X__ 1. Do not eat food after midnight the night before your procedure.                 No chewing gum or hard candies. You may drink clear liquids up to 2 hours                 before you are scheduled to arrive for your surgery- DO not drink clear                 liquids within 2 hours of the start of your surgery.                 Clear Liquids include:  water, apple juice without pulp, clear Gatorade, G2 or                  Gatorade Zero (avoid Red/Purple/Blue), Black Coffee or Tea (Do not add                 anything to coffee or tea).  __X__2.  On the morning of surgery brush your teeth with toothpaste and water, you                may rinse your mouth with mouthwash if you wish.  Do not swallow any toothpaste of mouthwash.     _X__ 3.  No Alcohol for 24 hours before or after surgery.   _X__ 4.  Do Not Smoke or use e-cigarettes For 24 Hours Prior to Your Surgery.                 Do not use any chewable tobacco products for at least 6 hours prior to                 Surgery.  _X__  5.  Do not use any recreational drugs (marijuana, cocaine, heroin, ecstasy, MDMA or other)                For at least one week prior to your surgery.  Combination of these drugs with anesthesia                May have life threatening results. .   __X__ 6.  Notify your doctor if there is any change in your medical condition      (cold, fever, infections).     Do not wear jewelry, make-up, hairpins, clips or nail  polish. Do not wear lotions, powders, or perfumes. You may wear deodorant. Do not shave 48 hours prior to surgery. Men may shave face and neck. Do not bring valuables to the hospital.    [facility] is not responsible for any belongings or valuables.  Contacts, dentures or bridgework may not be worn into surgery. Leave your suitcase in the car. After surgery it may be brought to your room. For patients admitted to the hospital, discharge time is determined by your treatment team.   Patients discharged the day of surgery will not be allowed to drive home.  Make arrangements for someone to be with you for the first 24 hours of your Same Day Discharge.  __X__ Take these medicines the morning of surgery with A SIP OF WATER:    1. escitalopram (LEXAPRO) 10 MG  2. allopurinol (ZYLOPRIM) 100 MG    ____ Fleet Enema (as directed)   __X__ Use CHG Soap as directed  ____ Use Benzoyl Peroxide Gel as instructed  ____ Use inhalers on the day of surgery  ____ Stop metformin 2 days prior to surgery    ____ Take 1/2 of usual insulin dose the night before surgery. No insulin the morning          of surgery.   __X__ Stop aspirin 81 MG 7 days before your procedure, as instructed by your doctor.  __X__ Stop Anti-inflammatories such as Ibuprofen, Aleve, Advil, naproxen and or BC powders.    __X__ Stop supplements until after surgery.    __X__ Do not start any herbal supplements before your procedure.   If you have any questions regarding your pre-procedure instructions,  Please call Pre-admit Testing at [phone].

## 2020-01-02 ENCOUNTER — Other Ambulatory Visit: Payer: Self-pay

## 2020-01-02 ENCOUNTER — Other Ambulatory Visit: Payer: Medicare Other

## 2020-01-02 ENCOUNTER — Other Ambulatory Visit
Admission: RE | Admit: 2020-01-02 | Discharge: 2020-01-02 | Disposition: A | Discharge status: Home / Self Care | Payer: Medicare Other | Source: Ambulatory Visit | Attending: General Surgery | Admitting: General Surgery

## 2020-01-02 DIAGNOSIS — Z20822 Contact with and (suspected) exposure to covid-19: Secondary | ICD-10-CM | POA: Diagnosis not present

## 2020-01-02 DIAGNOSIS — I10 Essential (primary) hypertension: Secondary | ICD-10-CM | POA: Insufficient documentation

## 2020-01-02 DIAGNOSIS — Z01818 Encounter for other preprocedural examination: Secondary | ICD-10-CM | POA: Insufficient documentation

## 2020-01-02 LAB — SARS CORONAVIRUS 2 (TAT 6-24 HRS): SARS Coronavirus 2: NEGATIVE

## 2020-01-05 MED ORDER — ORAL CARE MOUTH RINSE: 15.0000 mL | Freq: Once | OROMUCOSAL | Status: AC

## 2020-01-05 MED ORDER — FAMOTIDINE 20 MG PO TABS
20.0000 mg | ORAL_TABLET | Freq: Once | ORAL | Status: AC
  Administered 2020-01-06: 20 mg via ORAL

## 2020-01-05 MED ORDER — CHLORHEXIDINE GLUCONATE 0.12 % MT SOLN
15.0000 mL | Freq: Once | OROMUCOSAL | Status: AC
  Administered 2020-01-06: 15 mL via OROMUCOSAL

## 2020-01-05 MED ORDER — LACTATED RINGERS IV SOLN: INTRAVENOUS | Status: DC

## 2020-01-05 MED ORDER — CEFAZOLIN SODIUM-DEXTROSE 2-4 GM/100ML-% IV SOLN
2.0000 g | INTRAVENOUS | Status: AC
  Administered 2020-01-06: 2 g via INTRAVENOUS

## 2020-01-06 ENCOUNTER — Ambulatory Visit: Payer: Medicare Other | Admitting: Certified Registered Nurse Anesthetist

## 2020-01-06 ENCOUNTER — Encounter: Payer: Self-pay | Admitting: General Surgery

## 2020-01-06 ENCOUNTER — Encounter
Admission: RE | Disposition: A | Discharge status: Home / Self Care | Payer: Self-pay | Source: Home / Self Care | Attending: General Surgery

## 2020-01-06 ENCOUNTER — Other Ambulatory Visit: Payer: Self-pay

## 2020-01-06 ENCOUNTER — Ambulatory Visit
Admission: RE | Admit: 2020-01-06 | Discharge: 2020-01-06 | Disposition: A | Discharge status: Home / Self Care | Payer: Medicare Other | Attending: General Surgery | Admitting: General Surgery

## 2020-01-06 ENCOUNTER — Ambulatory Visit
Admission: RE | Admit: 2020-01-06 | Discharge: 2020-01-06 | Disposition: A | Discharge status: Home / Self Care | Payer: Medicare Other | Source: Ambulatory Visit | Attending: General Surgery | Admitting: General Surgery

## 2020-01-06 DIAGNOSIS — E119 Type 2 diabetes mellitus without complications: Secondary | ICD-10-CM | POA: Insufficient documentation

## 2020-01-06 DIAGNOSIS — Z79899 Other long term (current) drug therapy: Secondary | ICD-10-CM | POA: Insufficient documentation

## 2020-01-06 DIAGNOSIS — I10 Essential (primary) hypertension: Secondary | ICD-10-CM | POA: Diagnosis not present

## 2020-01-06 DIAGNOSIS — Z807 Family history of other malignant neoplasms of lymphoid, hematopoietic and related tissues: Secondary | ICD-10-CM | POA: Diagnosis not present

## 2020-01-06 DIAGNOSIS — Z853 Personal history of malignant neoplasm of breast: Secondary | ICD-10-CM | POA: Insufficient documentation

## 2020-01-06 DIAGNOSIS — C50911 Malignant neoplasm of unspecified site of right female breast: Secondary | ICD-10-CM

## 2020-01-06 DIAGNOSIS — L821 Other seborrheic keratosis: Secondary | ICD-10-CM | POA: Diagnosis not present

## 2020-01-06 DIAGNOSIS — Z801 Family history of malignant neoplasm of trachea, bronchus and lung: Secondary | ICD-10-CM | POA: Insufficient documentation

## 2020-01-06 DIAGNOSIS — Z923 Personal history of irradiation: Secondary | ICD-10-CM | POA: Insufficient documentation

## 2020-01-06 DIAGNOSIS — D0511 Intraductal carcinoma in situ of right breast: Secondary | ICD-10-CM | POA: Diagnosis not present

## 2020-01-06 DIAGNOSIS — Z7982 Long term (current) use of aspirin: Secondary | ICD-10-CM | POA: Insufficient documentation

## 2020-01-06 DIAGNOSIS — Z87891 Personal history of nicotine dependence: Secondary | ICD-10-CM | POA: Insufficient documentation

## 2020-01-06 DIAGNOSIS — Z820 Family history of epilepsy and other diseases of the nervous system: Secondary | ICD-10-CM | POA: Diagnosis not present

## 2020-01-06 DIAGNOSIS — Z803 Family history of malignant neoplasm of breast: Secondary | ICD-10-CM | POA: Diagnosis not present

## 2020-01-06 DIAGNOSIS — Z8249 Family history of ischemic heart disease and other diseases of the circulatory system: Secondary | ICD-10-CM | POA: Diagnosis not present

## 2020-01-06 DIAGNOSIS — Z170 Estrogen receptor positive status [ER+]: Secondary | ICD-10-CM | POA: Insufficient documentation

## 2020-01-06 DIAGNOSIS — C50411 Malignant neoplasm of upper-outer quadrant of right female breast: Secondary | ICD-10-CM

## 2020-01-06 HISTORY — PX: SIMPLE MASTECTOMY WITH AXILLARY SENTINEL NODE BIOPSY: SHX6098

## 2020-01-06 LAB — GLUCOSE, CAPILLARY: Glucose-Capillary: 148 mg/dL — ABNORMAL HIGH (ref 70–99)

## 2020-01-06 SURGERY — SIMPLE MASTECTOMY WITH AXILLARY SENTINEL NODE BIOPSY
Anesthesia: General | Laterality: Right

## 2020-01-06 MED ORDER — CEFAZOLIN SODIUM-DEXTROSE 2-4 GM/100ML-% IV SOLN
INTRAVENOUS | Status: AC
  Filled 2020-01-06: qty 100

## 2020-01-06 MED ORDER — KETAMINE HCL 50 MG/ML IJ SOLN
INTRAMUSCULAR | Status: AC
  Filled 2020-01-06: qty 10

## 2020-01-06 MED ORDER — FENTANYL CITRATE (PF) 100 MCG/2ML IJ SOLN
INTRAMUSCULAR | Status: DC | PRN
  Administered 2020-01-06 (×3): 25 ug via INTRAVENOUS
  Administered 2020-01-06: 50 ug via INTRAVENOUS
  Administered 2020-01-06 (×3): 25 ug via INTRAVENOUS

## 2020-01-06 MED ORDER — METHYLENE BLUE 0.5 % INJ SOLN
INTRAVENOUS | Status: AC
  Filled 2020-01-06: qty 10

## 2020-01-06 MED ORDER — ACETAMINOPHEN 10 MG/ML IV SOLN
INTRAVENOUS | Status: DC | PRN
  Administered 2020-01-06: 1000 mg via INTRAVENOUS

## 2020-01-06 MED ORDER — LIDOCAINE HCL (PF) 2 % IJ SOLN
INTRAMUSCULAR | Status: AC
  Filled 2020-01-06: qty 5

## 2020-01-06 MED ORDER — FENTANYL CITRATE (PF) 100 MCG/2ML IJ SOLN
INTRAMUSCULAR | Status: AC
  Administered 2020-01-06: 25 ug via INTRAVENOUS
  Filled 2020-01-06: qty 2

## 2020-01-06 MED ORDER — PHENYLEPHRINE HCL (PRESSORS) 10 MG/ML IV SOLN
INTRAVENOUS | Status: AC
  Filled 2020-01-06: qty 1

## 2020-01-06 MED ORDER — KETOROLAC TROMETHAMINE 30 MG/ML IJ SOLN
INTRAMUSCULAR | Status: DC | PRN
  Administered 2020-01-06: 15 mg via INTRAVENOUS

## 2020-01-06 MED ORDER — DEXAMETHASONE SODIUM PHOSPHATE 10 MG/ML IJ SOLN
INTRAMUSCULAR | Status: DC | PRN
  Administered 2020-01-06: 10 mg via INTRAVENOUS

## 2020-01-06 MED ORDER — KETAMINE HCL 10 MG/ML IJ SOLN
INTRAMUSCULAR | Status: DC | PRN
  Administered 2020-01-06: 20 mg via INTRAVENOUS
  Administered 2020-01-06: 10 mg via INTRAVENOUS

## 2020-01-06 MED ORDER — LIDOCAINE HCL (CARDIAC) PF 100 MG/5ML IV SOSY
PREFILLED_SYRINGE | INTRAVENOUS | Status: DC | PRN
  Administered 2020-01-06: 100 mg via INTRAVENOUS

## 2020-01-06 MED ORDER — FAMOTIDINE 20 MG PO TABS
ORAL_TABLET | ORAL | Status: AC
  Filled 2020-01-06: qty 1

## 2020-01-06 MED ORDER — KETOROLAC TROMETHAMINE 30 MG/ML IJ SOLN
INTRAMUSCULAR | Status: AC
  Filled 2020-01-06: qty 1

## 2020-01-06 MED ORDER — PHENYLEPHRINE HCL (PRESSORS) 10 MG/ML IV SOLN
INTRAVENOUS | Status: DC | PRN
  Administered 2020-01-06 (×4): 50 ug via INTRAVENOUS
  Administered 2020-01-06: 100 ug via INTRAVENOUS

## 2020-01-06 MED ORDER — PROPOFOL 10 MG/ML IV BOLUS
INTRAVENOUS | Status: DC | PRN
  Administered 2020-01-06: 100 mg via INTRAVENOUS

## 2020-01-06 MED ORDER — OXYCODONE HCL 5 MG PO TABS: 5.0000 mg | ORAL_TABLET | Freq: Once | ORAL | Status: DC | PRN

## 2020-01-06 MED ORDER — HYDROCODONE-ACETAMINOPHEN 5-325 MG PO TABS: 1.0000 | ORAL_TABLET | ORAL | 0 refills | Status: DC | PRN

## 2020-01-06 MED ORDER — ONDANSETRON HCL 4 MG/2ML IJ SOLN
INTRAMUSCULAR | Status: AC
  Filled 2020-01-06: qty 2

## 2020-01-06 MED ORDER — CHLORHEXIDINE GLUCONATE 0.12 % MT SOLN
OROMUCOSAL | Status: AC
  Filled 2020-01-06: qty 15

## 2020-01-06 MED ORDER — METHYLENE BLUE 0.5 % INJ SOLN
INTRAVENOUS | Status: DC | PRN
  Administered 2020-01-06: 5 mL

## 2020-01-06 MED ORDER — FENTANYL CITRATE (PF) 100 MCG/2ML IJ SOLN
25.0000 ug | INTRAMUSCULAR | Status: DC | PRN
  Administered 2020-01-06 (×3): 25 ug via INTRAVENOUS

## 2020-01-06 MED ORDER — GLYCOPYRROLATE 0.2 MG/ML IJ SOLN
INTRAMUSCULAR | Status: AC
  Filled 2020-01-06: qty 1

## 2020-01-06 MED ORDER — GLYCOPYRROLATE 0.2 MG/ML IJ SOLN
INTRAMUSCULAR | Status: DC | PRN
  Administered 2020-01-06: .1 mg via INTRAVENOUS

## 2020-01-06 MED ORDER — ONDANSETRON HCL 4 MG/2ML IJ SOLN
INTRAMUSCULAR | Status: DC | PRN
  Administered 2020-01-06: 4 mg via INTRAVENOUS

## 2020-01-06 MED ORDER — FENTANYL CITRATE (PF) 100 MCG/2ML IJ SOLN
INTRAMUSCULAR | Status: AC
  Filled 2020-01-06: qty 2

## 2020-01-06 MED ORDER — DEXAMETHASONE SODIUM PHOSPHATE 10 MG/ML IJ SOLN
INTRAMUSCULAR | Status: AC
  Filled 2020-01-06: qty 1

## 2020-01-06 MED ORDER — TECHNETIUM TC 99M SULFUR COLLOID FILTERED
0.6030 | Freq: Once | INTRAVENOUS | Status: AC | PRN
  Administered 2020-01-06: 0.603 via INTRADERMAL

## 2020-01-06 MED ORDER — PROPOFOL 10 MG/ML IV BOLUS
INTRAVENOUS | Status: AC
  Filled 2020-01-06: qty 40

## 2020-01-06 MED ORDER — OXYCODONE HCL 5 MG/5ML PO SOLN: 5.0000 mg | Freq: Once | ORAL | Status: DC | PRN

## 2020-01-06 MED ORDER — ACETAMINOPHEN 10 MG/ML IV SOLN
INTRAVENOUS | Status: AC
  Filled 2020-01-06: qty 100

## 2020-01-06 SURGICAL SUPPLY — 53 items
APL PRP STRL LF DISP 70% ISPRP (MISCELLANEOUS) ×1
APPLIER CLIP 11 MED OPEN (CLIP)
APPLIER CLIP 13 LRG OPEN (CLIP)
APR CLP LRG 13 20 CLIP (CLIP)
APR CLP MED 11 20 MLT OPN (CLIP)
BINDER BREAST LRG (GAUZE/BANDAGES/DRESSINGS) ×2 IMPLANT
BLADE PHOTON ILLUMINATED (MISCELLANEOUS) ×2 IMPLANT
BLADE SURG 15 STRL SS SAFETY (BLADE) ×2 IMPLANT
BULB RESERV EVAC DRAIN JP 100C (MISCELLANEOUS) ×2 IMPLANT
CANISTER SUCT 1200ML W/VALVE (MISCELLANEOUS) ×2 IMPLANT
CHLORAPREP W/TINT 26 (MISCELLANEOUS) ×2 IMPLANT
CLIP APPLIE 11 MED OPEN (CLIP) IMPLANT
CLIP APPLIE 13 LRG OPEN (CLIP) IMPLANT
CNTNR SPEC 2.5X3XGRAD LEK (MISCELLANEOUS) ×3
CONT SPEC 4OZ STER OR WHT (MISCELLANEOUS) ×3
CONT SPEC 4OZ STRL OR WHT (MISCELLANEOUS) ×3
CONTAINER SPEC 2.5X3XGRAD LEK (MISCELLANEOUS) ×3 IMPLANT
COVER WAND RF STERILE (DRAPES) ×2 IMPLANT
DRAIN CHANNEL JP 15F RND 16 (MISCELLANEOUS) ×2 IMPLANT
DRAPE LAPAROTOMY TRNSV 106X77 (MISCELLANEOUS) ×2 IMPLANT
DRSG GAUZE FLUFF 36X18 (GAUZE/BANDAGES/DRESSINGS) ×2 IMPLANT
DRSG TELFA 3X8 NADH (GAUZE/BANDAGES/DRESSINGS) ×2 IMPLANT
ELECT CAUTERY BLADE TIP 2.5 (TIP) ×2
ELECT REM PT RETURN 9FT ADLT (ELECTROSURGICAL) ×2
ELECTRODE CAUTERY BLDE TIP 2.5 (TIP) ×1 IMPLANT
ELECTRODE REM PT RTRN 9FT ADLT (ELECTROSURGICAL) ×1 IMPLANT
GLOVE BIO SURGEON STRL SZ7.5 (GLOVE) ×2 IMPLANT
GLOVE INDICATOR 8.0 STRL GRN (GLOVE) ×2 IMPLANT
GOWN STRL REUS W/ TWL LRG LVL3 (GOWN DISPOSABLE) ×2 IMPLANT
GOWN STRL REUS W/TWL LRG LVL3 (GOWN DISPOSABLE) ×4
LABEL OR SOLS (LABEL) ×2 IMPLANT
MANIFOLD NEPTUNE II (INSTRUMENTS) ×2 IMPLANT
PACK BASIN MINOR (MISCELLANEOUS) ×2 IMPLANT
PIN SAFETY STRL (MISCELLANEOUS) ×2 IMPLANT
RETRACTOR RING XSMALL (MISCELLANEOUS) IMPLANT
RTRCTR WOUND ALEXIS 13CM XS SH (MISCELLANEOUS)
SHEARS FOC LG CVD HARMONIC 17C (MISCELLANEOUS) IMPLANT
SLEVE PROBE SENORX GAMMA FIND (MISCELLANEOUS) ×2 IMPLANT
SPONGE LAP 18X18 RF (DISPOSABLE) ×2 IMPLANT
STRIP CLOSURE SKIN 1/2X4 (GAUZE/BANDAGES/DRESSINGS) ×4 IMPLANT
SUT ETHILON 3-0 FS-10 30 BLK (SUTURE) ×2
SUT SILK 2 0 (SUTURE) ×2
SUT SILK 2-0 30XBRD TIE 12 (SUTURE) ×1 IMPLANT
SUT SILK 3 0 (SUTURE) ×2
SUT SILK 3-0 18XBRD TIE 12 (SUTURE) ×1 IMPLANT
SUT VIC AB 2-0 CT1 27 (SUTURE) ×6
SUT VIC AB 2-0 CT1 TAPERPNT 27 (SUTURE) ×3 IMPLANT
SUT VIC AB 3-0 SH 27 (SUTURE) ×2
SUT VIC AB 3-0 SH 27X BRD (SUTURE) ×1 IMPLANT
SUT VICRYL+ 3-0 144IN (SUTURE) ×2 IMPLANT
SUTURE EHLN 3-0 FS-10 30 BLK (SUTURE) ×1 IMPLANT
SWABSTK COMLB BENZOIN TINCTURE (MISCELLANEOUS) ×2 IMPLANT
TAPE TRANSPORE STRL 2 31045 (GAUZE/BANDAGES/DRESSINGS) ×2 IMPLANT

## 2020-01-06 NOTE — Anesthesia Procedure Notes (Signed)
Procedure Name: LMA Insertion Date/Time: 01/06/2020 9:05 AM Performed by: Lily Peer, Thamas Appleyard, CRNA Pre-anesthesia Checklist: Patient identified, Emergency Drugs available, Suction available and Patient being monitored Patient Re-evaluated:Patient Re-evaluated prior to induction Oxygen Delivery Method: Circle system utilized Preoxygenation: Pre-oxygenation with 100% oxygen Induction Type: IV induction Ventilation: Mask ventilation without difficulty LMA: LMA inserted LMA Size: 3.0 Number of attempts: 1 Placement Confirmation: positive ETCO2 and breath sounds checked- equal and bilateral Tube secured with: Tape Dental Injury: Teeth and Oropharynx as per pre-operative assessment

## 2020-01-06 NOTE — H&P (Signed)
No change in clinical history or exam. For right mastectomy and SLN biopsy.

## 2020-01-06 NOTE — Anesthesia Preprocedure Evaluation (Signed)
Anesthesia Evaluation  Patient identified by MRN, date of birth, ID band Patient awake    Reviewed: Allergy & Precautions, H&P , NPO status , Patient's Chart, lab work & pertinent test results  History of Anesthesia Complications Negative for: history of anesthetic complications  Airway Mallampati: III  TM Distance: <3 FB Neck ROM: limited    Dental  (+) Chipped   Pulmonary neg shortness of breath, former smoker,    Pulmonary exam normal        Cardiovascular Exercise Tolerance: Good hypertension, Normal cardiovascular exam     Neuro/Psych PSYCHIATRIC DISORDERS negative neurological ROS     GI/Hepatic negative GI ROS, Neg liver ROS,   Endo/Other  diabetes, Well Controlled, Type 2  Renal/GU Renal disease     Musculoskeletal  (+) Arthritis ,   Abdominal   Peds  Hematology negative hematology ROS (+)   Anesthesia Other Findings Past Medical History: No date: Anemia No date: Arthritis 2002: Breast cancer ([place])     Comment:  right breast 2011: Breast cancer ([place])     Comment:  left breast No date: Cancer ([place]) No date: Family history of breast cancer No date: Family history of lung cancer No date: Family history of non-Hodgkin's lymphoma No date: Family history of prostate cancer No date: Hypertension No date: Malignant neoplasm of right breast ([place]) 2002, 2011: Personal history of radiation therapy     Comment:  right and left breast No date: Pre-diabetes  Past Surgical History: 2018: ABDOMINAL HYSTERECTOMY 10/04/2018: BREAST BIOPSY; Right     Comment:  u/s bx, vision marker, path pending 2002: BREAST LUMPECTOMY; Right     Comment:  breast ca with rad 2011: BREAST LUMPECTOMY; Left     Comment:  breast ca with rad 2011,2002: BREAST SURGERY 2015: CATARACT EXTRACTION No date: COLONOSCOPY 06/14/2019: COLONOSCOPY WITH PROPOFOL; N/A     Comment:  Procedure: COLONOSCOPY WITH PROPOFOL;  Surgeon: Lin Landsman, MD;  Location: [facility];  Service:               Gastroenterology;  Laterality: N/A; 06/14/2019: ESOPHAGOGASTRODUODENOSCOPY (EGD) WITH PROPOFOL; N/A     Comment:  Procedure: ESOPHAGOGASTRODUODENOSCOPY (EGD) WITH               PROPOFOL;  Surgeon: Lin Landsman, MD;  Location:               [facility];  Service: Gastroenterology;  Laterality:               N/A;  BMI    Body Mass Index: 30.61 kg/m      Reproductive/Obstetrics negative OB ROS                             Anesthesia Physical Anesthesia Plan  ASA: III  Anesthesia Plan: General LMA   Post-op Pain Management:    Induction: Intravenous  PONV Risk Score and Plan: Dexamethasone, Ondansetron, Midazolam and Treatment may vary due to age or medical condition  Airway Management Planned: LMA  Additional Equipment:   Intra-op Plan:   Post-operative Plan: Extubation in OR  Informed Consent: I have reviewed the patients History and Physical, chart, labs and discussed the procedure including the risks, benefits and alternatives for the proposed anesthesia with the patient or authorized representative who has indicated his/her understanding and acceptance.     Dental Advisory Given  Plan Discussed with: Anesthesiologist, CRNA and Surgeon  Anesthesia Plan Comments: (Patient consented for risks of anesthesia including but not limited to:  - adverse reactions to medications - damage to eyes, teeth, lips or other oral mucosa - nerve damage due to positioning  - sore throat or hoarseness - Damage to heart, brain, nerves, lungs, other parts of body or loss of life  Patient voiced understanding.)        Anesthesia Quick Evaluation

## 2020-01-06 NOTE — Progress Notes (Signed)
   01/06/20 0739  Clinical Encounter Type  Visited With Family  Visit Type Initial  Referral From Chaplain  Consult/Referral To Chaplain  While rounding [facility] waiting area, chaplain briefly visited with Pt's husband.

## 2020-01-06 NOTE — Transfer of Care (Addendum)
Immediate Anesthesia Transfer of Care Note  Patient: Tracy Lawrence  Procedure(s) Performed: SIMPLE MASTECTOMY WITH AXILLARY SENTINEL NODE BIOPSY (Right )  Patient Location: PACU  Anesthesia Type:General  Level of Consciousness: drowsy  Airway & Oxygen Therapy: Patient Spontanous Breathing and Patient connected to face mask oxygen  Post-op Assessment: Report given to RN and Post -op Vital signs reviewed and stable  Post vital signs: Reviewed and stable  Last Vitals:  Vitals Value Taken Time  BP 149/59   Temp    Pulse 85   Resp 12   SpO2 100     Last Pain:  Vitals:   01/06/20 1037  TempSrc:   PainSc: Asleep         Complications: No complications documented.

## 2020-01-06 NOTE — Anesthesia Postprocedure Evaluation (Signed)
Anesthesia Post Note  Patient: Tracy Lawrence  Procedure(s) Performed: SIMPLE MASTECTOMY WITH AXILLARY SENTINEL NODE BIOPSY (Right )  Patient location during evaluation: PACU Anesthesia Type: General Level of consciousness: awake and alert Pain management: pain level controlled Vital Signs Assessment: post-procedure vital signs reviewed and stable Respiratory status: spontaneous breathing, nonlabored ventilation, respiratory function stable and patient connected to nasal cannula oxygen Cardiovascular status: blood pressure returned to baseline and stable Postop Assessment: no apparent nausea or vomiting Anesthetic complications: no   No complications documented.   Last Vitals:  Vitals:   01/06/20 1148 01/06/20 1212  BP: (!) 192/88 (!) 166/90  Pulse: 93 86  Resp: 16 16  Temp: (!) 36.4 C   SpO2: 95% 97%    Last Pain:  Vitals:   01/06/20 1212  TempSrc:   PainSc: 0-No pain                 Precious Haws Nakeshia Waldeck

## 2020-01-06 NOTE — Op Note (Signed)
Preoperative diagnosis: Invasive mammary carcinoma of the right breast, prior breast cancer.  Postoperative diagnosis: Same.  Operative procedure: Right simple mastectomy with attempted sentinel node biopsy.  Operating surgeon: Hervey Ard, MD.  Anesthesia: General by LMA.  Estimated blood loss: 15 cc.  Clinical note: This 79 year old woman had lower inner quadrant cancer of the right breast about 19 years ago.  She has developed a new breast cancer.  Reviewed options for repeat local excision with MammoSite radiation versus mastectomy with or without reconstruction.  In light of her previous radiation she is felt to be best served by mastectomy without reconstruction.  The patient received Ancef prior to the procedure.  SCD stockings on the right leg prior to surgery.  Left leg use for BP measurements.  The patient was injected with technetium sulfur colloid prior to the procedure.  After the induction of general anesthesia 5 cc of 0.5% methylene blue was injected in subareolar plexus.  Operative note: With the patient under adequate general anesthesia the breast was prepped with ChloraPrep and draped.  Methylene blue was instilled as noted above.  Scanning in the axilla showed very low counts of about 80-90 at the anterior edge of her prior axillary node biopsy site.  An elliptical incision was outlined and the skin was incised sharply.  The remaining dissection was completed with the photon blade.  Flaps were about 5 mm in diameter.  There were elevated to the clavicle superiorly, sternum medially, rectus fascia inferiorly and to the serratus muscle laterally.  The pectoralis fascia was taken off the underlying muscle with the specimen.  Hemostasis was with electrocautery and 3-0 Vicryl ties.  Scanning through the axilla showed no blue lymphatics and a firm block of tissue about 2 x 3 cm in diameter near the area of the prior sentinel node biopsy site.  This was resected en bloc with  hemostasis by 3-0 Vicryl ties.  Rescanning showed no areas of increased uptake.  The mastectomy site was irrigated with sterile water.  A 15-gauge Blake drain was brought out through the inferior medial flap and anchored in place with 3-0 nylon.  The skin flaps were approximated with running 2-0 Vicryl deep dermal suture in 2 segments.  Benzoin and Steri-Strips were applied followed by Telfa, fluff gauze and a compressive wrap.  The patient tolerated the procedure well and was taken to recovery room in stable condition.

## 2020-01-06 NOTE — Discharge Instructions (Signed)

## 2020-01-09 LAB — SURGICAL PATHOLOGY

## 2020-01-15 ENCOUNTER — Telehealth: Payer: Self-pay

## 2020-01-15 NOTE — Telephone Encounter (Signed)
Oncotype DX submitted online to [employer] (ORD [id number]). Insurance card, face sheet and path report faxed to [employer] at [phone].   Specimen ID [id number] collected on 01/06/20

## 2020-01-16 NOTE — Telephone Encounter (Signed)
Done MD follow up has been sched as requested A detailed message was left on pts VM making her aware of her sched appt date and time

## 2020-01-23 ENCOUNTER — Other Ambulatory Visit: Payer: Self-pay | Admitting: General Surgery

## 2020-01-27 ENCOUNTER — Other Ambulatory Visit: Payer: Self-pay | Admitting: Family

## 2020-01-29 ENCOUNTER — Encounter: Payer: Self-pay | Admitting: Oncology

## 2020-02-03 ENCOUNTER — Encounter: Payer: Self-pay | Admitting: Oncology

## 2020-02-03 ENCOUNTER — Inpatient Hospital Stay: Payer: Medicare Other | Attending: Oncology | Admitting: Oncology

## 2020-02-03 ENCOUNTER — Other Ambulatory Visit: Payer: Self-pay

## 2020-02-03 VITALS — BP 154/86 | HR 93 | Temp 98.5°F | Resp 18 | Wt 159.7 lb

## 2020-02-03 DIAGNOSIS — Z807 Family history of other malignant neoplasms of lymphoid, hematopoietic and related tissues: Secondary | ICD-10-CM | POA: Insufficient documentation

## 2020-02-03 DIAGNOSIS — Z79899 Other long term (current) drug therapy: Secondary | ICD-10-CM | POA: Insufficient documentation

## 2020-02-03 DIAGNOSIS — Z803 Family history of malignant neoplasm of breast: Secondary | ICD-10-CM | POA: Diagnosis not present

## 2020-02-03 DIAGNOSIS — Z801 Family history of malignant neoplasm of trachea, bronchus and lung: Secondary | ICD-10-CM | POA: Diagnosis not present

## 2020-02-03 DIAGNOSIS — Z7984 Long term (current) use of oral hypoglycemic drugs: Secondary | ICD-10-CM | POA: Insufficient documentation

## 2020-02-03 DIAGNOSIS — Z79811 Long term (current) use of aromatase inhibitors: Secondary | ICD-10-CM | POA: Insufficient documentation

## 2020-02-03 DIAGNOSIS — C50911 Malignant neoplasm of unspecified site of right female breast: Secondary | ICD-10-CM | POA: Diagnosis present

## 2020-02-03 DIAGNOSIS — Z170 Estrogen receptor positive status [ER+]: Secondary | ICD-10-CM | POA: Diagnosis not present

## 2020-02-03 DIAGNOSIS — Z8042 Family history of malignant neoplasm of prostate: Secondary | ICD-10-CM | POA: Diagnosis not present

## 2020-02-03 DIAGNOSIS — N1832 Chronic kidney disease, stage 3b: Secondary | ICD-10-CM | POA: Insufficient documentation

## 2020-02-03 DIAGNOSIS — Z87891 Personal history of nicotine dependence: Secondary | ICD-10-CM | POA: Insufficient documentation

## 2020-02-03 DIAGNOSIS — Z8249 Family history of ischemic heart disease and other diseases of the circulatory system: Secondary | ICD-10-CM | POA: Insufficient documentation

## 2020-02-03 DIAGNOSIS — Z923 Personal history of irradiation: Secondary | ICD-10-CM | POA: Diagnosis not present

## 2020-02-03 DIAGNOSIS — Z7982 Long term (current) use of aspirin: Secondary | ICD-10-CM | POA: Insufficient documentation

## 2020-02-03 DIAGNOSIS — Z9011 Acquired absence of right breast and nipple: Secondary | ICD-10-CM | POA: Diagnosis not present

## 2020-02-03 DIAGNOSIS — D509 Iron deficiency anemia, unspecified: Secondary | ICD-10-CM | POA: Diagnosis not present

## 2020-02-03 MED ORDER — ANASTROZOLE 1 MG PO TABS: 1.0000 mg | ORAL_TABLET | Freq: Every day | ORAL | 3 refills | Status: DC

## 2020-02-03 NOTE — Progress Notes (Signed)
Pt here for follow up. No new concerns voiced. No new breast problems  

## 2020-02-03 NOTE — Progress Notes (Signed)
Hematology/Oncology Consult note [facility] Telephone:([phone] [phone]   Patient Care Team: Burnard Hawthorne, FNP as PCP - General (Family Medicine) Theodore Demark, RN as Oncology Nurse Navigator (Oncology) Noreene Filbert, MD as Radiation Oncologist (Radiation Oncology)  REFERRING PROVIDER: Burnard Hawthorne, FNP  CHIEF COMPLAINTS/REASON FOR VISIT:  Evaluation for breast cancer  PERTINENT ONCOLOGY HISTORY Tracy Lawrence is a  80 y.o.  female with PMH listed below was seen in consultation at the request of  Burnard Hawthorne, FNP  for evaluation of abnormal SPEP. Reviewed patient's previous blood work via care everywhere. 08/27/2018, free light chain ratio 1.97, free kappa 79.5, free lambda 40.3. Protein electrophoresis showed increased alpha globulin.  #Patient moved from [place] to [place] last year. She reports history of right breast cancer in 2002 and left breast cancer in 2011.  She underwent right breast lumpectomy followed by adjuvant radiation followed by 5 years of tamoxifen.  Underwent left lumpectomy followed by adjuvant radiation followed by aromatase inhibitor for 5 years.  Currently she is not on any antiestrogen treatment.  Denies any chemotherapy treatment history.  Both breast cancer treatments were done in [place]. Obtained records from [facility] clinic breast surgery [place] [facility] Records were reviewed.  No pathology is available. 06/06/2017 screening mammogram showed postoperative changes in both breasts.  Architectural distortion has not changed significantly.  Skin thickening is similar.  Calcifications have increased mildly around the postoperative sites bilaterally.  No new dominant mass.   # Chronic history of kidney insufficiency, stage IIIb.  Patient sees Dr. Holley Raring.  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  # Breast exam was performed in seated and lying  down position. Right breast lower inner quadrant lumpectomy scar with focal scar tissue. Left breast upper outer quadrant, 1:00 lumpectomy scar with palpable round thickened breast tissue. No palpable axillary lymphadenopathy.  # #Family history of breast cancer in first-degree relatives, patient reports that she was previously tested negative for BRCA gene mutation.  Results were not available. Patient was referred to genetic counselor and 11/19/2019 [facility] Breast Cancer STAT Panel + Multi- cancer panel found no pathogenic mutations.   # 09/17/2019 bilateral screening mammogram showed right breast mass which warrants further evaluation.  No suspicious findings in left breast. 09/25/2019, right diagnostic breast mammogram showed specialist right breast mass at 10:00. Right axilla is negative for adenopathy.  Patient underwent ultrasound-guided core biopsy of the right breast 10:00 location. 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  Grade 2, no DCIS or LVI.  ER 90%, PR 11-50%, HER-2 negative.  Chronic microcytic anemia, iron panel is not consistent with iron deficiency. Normal hemoglobin evaluation.  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 Hemoglobin stable at 11.6. GI work-up includes EGD and colonoscopy on 06/14/2019.  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  Benign colon polyps.  No high-grade dysplasia or malignancy.  She follows up with Dr. Marius Ditch Anemia can be also secondary to CKD.  #INTERVAL HISTORY Tracy Lawrence is a 80 y.o. female who has above history reviewed by me today presents for follow up visit for breast cancer.  Problems and complaints are listed below: #01/06/2020 patient underwent right mastectomy and sentinel lymph node biopsy.  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  She has a follow-up appointment with Dr. Bary Castilla later  this week.  She is accompanied by her husband today to go over pathology results and further management plan.  Review of Systems  Constitutional: Negative for appetite change, chills, fatigue and fever.  HENT:   Negative for hearing loss and voice change.   Eyes: Negative for eye problems.  Respiratory: Negative for chest tightness and cough.   Cardiovascular: Negative for chest pain.  Gastrointestinal: Negative for abdominal distention, abdominal pain and blood in stool.  Endocrine: Negative for hot flashes.  Genitourinary: Negative for difficulty urinating and frequency.   Musculoskeletal: Negative for arthralgias.  Skin: Negative for itching and rash.  Neurological: Negative for extremity weakness.  Hematological: Negative for adenopathy.  Psychiatric/Behavioral: Negative for confusion.   Right mastectomy MEDICAL HISTORY:  Past Medical History:  Diagnosis Date  . Anemia   . Arthritis   . Breast cancer [facility]) 2002   right breast  . Breast cancer ([place]) 2011   left breast  . Cancer ([place])   . Family history of breast cancer   . Family history of lung cancer   . Family history of non-Hodgkin's lymphoma   . Family history of prostate cancer   . Hypertension   . Malignant neoplasm of right breast ([place])   . Personal history of radiation therapy 2002, 2011   right and left breast  . Pre-diabetes     SURGICAL HISTORY: Past Surgical History:  Procedure Laterality Date  . ABDOMINAL HYSTERECTOMY  2018  . BREAST BIOPSY Right 10/04/2018   u/s bx, vision marker, path pending  . BREAST LUMPECTOMY Right 2002   breast ca with rad  . BREAST LUMPECTOMY Left 2011   breast ca with rad  . BREAST SURGERY  2011,2002  . CATARACT EXTRACTION  2015  . COLONOSCOPY    . COLONOSCOPY WITH PROPOFOL N/A 06/14/2019   Procedure: COLONOSCOPY WITH PROPOFOL;  Surgeon: Lin Landsman, MD;  Location: [facility] ENDOSCOPY;  Service: Gastroenterology;  Laterality: N/A;  . ESOPHAGOGASTRODUODENOSCOPY (EGD)  WITH PROPOFOL N/A 06/14/2019   Procedure: ESOPHAGOGASTRODUODENOSCOPY (EGD) WITH PROPOFOL;  Surgeon: Lin Landsman, MD;  Location: [facility] ENDOSCOPY;  Service: Gastroenterology;  Laterality: N/A;  . SIMPLE MASTECTOMY WITH AXILLARY SENTINEL NODE BIOPSY Right 01/06/2020   Procedure: SIMPLE MASTECTOMY WITH AXILLARY SENTINEL NODE BIOPSY;  Surgeon: Robert Bellow, MD;  Location: [facility] ORS;  Service: General;  Laterality: Right;    SOCIAL HISTORY: Social History   Socioeconomic History  . Marital status: Married    Spouse name: Not on file  . Number of children: Not on file  . Years of education: Not on file  . Highest education level: Not on file  Occupational History  . Not on file  Tobacco Use  . Smoking status: Former Smoker    Packs/day: 1.00    Years: 12.00    Pack years: 12.00    Quit date: 03/18/1979    Years since quitting: 40.9  . Smokeless tobacco: Never Used  . Tobacco comment: quit 40 years ago  Vaping Use  . Vaping Use: Never used  Substance and Sexual Activity  . Alcohol use: Yes    Comment: rarely  . Drug use: Never  . Sexual activity: Not on file  Other Topics Concern  . Not on file  Social History Narrative   From [place]      Retired      Social Determinants of Radio broadcast assistant Strain: [place]   . Difficulty of Paying Living Expenses: Not hard at all  Food Insecurity: No Food Insecurity  .  Worried About Charity fundraiser in the Last Year: Never true  . Ran Out of Food in the Last Year: Never true  Transportation Needs: No Transportation Needs  . Lack of Transportation (Medical): No  . Lack of Transportation (Non-Medical): No  Physical Activity: Not on file  Stress: No Stress Concern Present  . Feeling of Stress : Not at all  Social Connections: Unknown  . Frequency of Communication with Friends and Family: Not on file  . Frequency of Social Gatherings with Friends and Family: Not on file  . Attends Religious Services: Not on file   . Active Member of Clubs or Organizations: Not on file  . Attends Archivist Meetings: Not on file  . Marital Status: Married  Human resources officer Violence: Not At Risk  . Fear of Current or Ex-Partner: No  . Emotionally Abused: No  . Physically Abused: No  . Sexually Abused: No    FAMILY HISTORY: Family History  Problem Relation Age of Onset  . Heart attack Mother   . Cancer Father   . Lung cancer Father   . Breast cancer Sister 38       dx again 4  . Heart attack Sister   . Non-Hodgkin's lymphoma Sister 52  . Heart attack Sister   . Dementia Sister 36  . Breast cancer Paternal Aunt   . Breast cancer Maternal Aunt   . Prostate cancer Paternal Grandfather        metastic, d. 14s  . Colon cancer Neg Hx     ALLERGIES:  has No Known Allergies.  MEDICATIONS:  Current Outpatient Medications  Medication Sig Dispense Refill  . acetaminophen (TYLENOL) 500 MG tablet Take 1,000 mg by mouth every 6 (six) hours as needed for moderate pain or headache.    . allopurinol (ZYLOPRIM) 100 MG tablet TAKE 1 TABLET(100 MG) BY MOUTH DAILY 30 tablet 3  . anastrozole (ARIMIDEX) 1 MG tablet Take 1 tablet (1 mg total) by mouth daily. 30 tablet 3  . Calcium Carb-Cholecalciferol (CALCIUM 600 + D PO) Take 1 tablet by mouth daily.    . calcium carbonate (TUMS - DOSED IN MG ELEMENTAL CALCIUM) 500 MG chewable tablet Chew 1,000 mg by mouth daily as needed for indigestion or heartburn.    . Carboxymethylcellul-Glycerin (LUBRICATING EYE DROPS OP) Place 1 drop into both eyes daily as needed (irritated eyes).    Marland Kitchen escitalopram (LEXAPRO) 10 MG tablet TAKE 1 TABLET BY MOUTH EVERY DAY FOR DEPRESSION (Patient taking differently: Take 10 mg by mouth daily.) 90 tablet 0  . losartan (COZAAR) 50 MG tablet Take 1 tablet (50 mg total) by mouth at bedtime. 90 tablet 1  . Multiple Vitamins-Minerals (EYE VITAMINS PO) Take 1 tablet by mouth daily.     . rosuvastatin (CRESTOR) 40 MG tablet TAKE 1 TABLET BY MOUTH  EVERY DAY FOR CHOLESTEROL 90 tablet 1  . allopurinol (ZYLOPRIM) 100 MG tablet TAKE 1 TABLET(100 MG) BY MOUTH DAILY (Patient not taking: Reported on 02/03/2020) 30 tablet 3  . aspirin 81 MG EC tablet Take 81 mg by mouth daily.  (Patient not taking: Reported on 02/03/2020)    . HYDROcodone-acetaminophen (NORCO/VICODIN) 5-325 MG tablet Take 1 tablet by mouth every 4 (four) hours as needed for moderate pain. (Patient not taking: Reported on 02/03/2020) 12 tablet 0  . metFORMIN (GLUCOPHAGE) 500 MG tablet Take 500 mg by mouth 2 (two) times daily with a meal. (Patient not taking: No sig reported)     No  current facility-administered medications for this visit.     PHYSICAL EXAMINATION: ECOG PERFORMANCE STATUS: 0 - Asymptomatic Vitals:   02/03/20 1059  BP: (!) 154/86  Pulse: 93  Resp: 18  Temp: 98.5 F (36.9 C)   Filed Weights   02/03/20 1059  Weight: 159 lb 11.2 oz (72.4 kg)    Physical Exam Constitutional:      General: She is not in acute distress. HENT:     Head: Normocephalic and atraumatic.  Eyes:     General: No scleral icterus. Cardiovascular:     Rate and Rhythm: Normal rate and regular rhythm.     Heart sounds: Normal heart sounds.  Pulmonary:     Effort: Pulmonary effort is normal. No respiratory distress.     Breath sounds: No wheezing.  Abdominal:     General: Bowel sounds are normal. There is no distension.     Palpations: Abdomen is soft.  Musculoskeletal:        General: No deformity. Normal range of motion.     Cervical back: Normal range of motion and neck supple.  Skin:    General: Skin is warm and dry.     Findings: No erythema or rash.  Neurological:     Mental Status: She is alert and oriented to person, place, and time. Mental status is at baseline.     Cranial Nerves: No cranial nerve deficit.     Coordination: Coordination normal.  Psychiatric:        Mood and Affect: Mood normal.   Breast exam was performed in seated and lying down position. Status  post right mastectomy.+ Drainage tubes. Left breast upper outer quadrant, 1:00 lumpectomy scar with palpable round thickened breast tissue. No palpable axillary lymphadenopathy.   LABORATORY DATA:  I have reviewed the data as listed Lab Results  Component Value Date   WBC 5.6 10/14/2019   HGB 11.6 (L) 10/14/2019   HCT 35.9 (L) 10/14/2019   MCV 77.9 (L) 10/14/2019   PLT 181 10/14/2019   Recent Labs    03/15/19 0943 10/02/19 1324 10/14/19 1445  NA 137 136 136  K 4.3 4.8 4.4  CL 103 100 101  CO2 '30 27 26  ' GLUCOSE 104* 133* 122*  BUN 24* 24* 21  CREATININE 1.16 1.19* 0.99  CALCIUM 9.9 9.1 9.5  GFRNONAA  --  44* 55*  GFRAA  --  51* >60  PROT 7.6 7.3 7.9  ALBUMIN 3.9 3.9 4.1  AST '18 20 21  ' ALT '16 16 19  ' ALKPHOS 77 76 73  BILITOT 0.4 0.3 0.5   Iron/TIBC/Ferritin/ %Sat    Component Value Date/Time   IRON 45 10/02/2019 1324   TIBC 283 10/02/2019 1324   FERRITIN 106 10/02/2019 1324   IRONPCTSAT 16 10/02/2019 1324      RADIOGRAPHIC STUDIES: I have personally reviewed the radiological images as listed and agreed with the findings in the report. NM Sentinel Node Inj-No Rpt (Breast)  Result Date: 01/06/2020 Sulfur colloid was injected by the nuclear medicine technologist for melanoma sentinel node.       ASSESSMENT & PLAN:  1. Malignant neoplasm of right breast in female, estrogen receptor positive, unspecified site of breast ([place])   2. Microcytic anemia    #Right breast invasive carcinoma, grade 2 ER 90%/PR 11-50%/HER-2 negative pT1b pN0 Pathology was reviewed and discussed with patient. She needs to follow-up with Dr. Bary Castilla. Oncotype DX recurrence score is 17, patient above 83 years old, distant recurrence risk at 9 years  5%, chemotherapy benefit less than 1%.  No adjuvant chemotherapy will be offered She had mastectomy with negative margin, no need for adjuvant radiation. Baseline elevated CA 27.29.  Rationale of using aromatase inhibitor -Arimidex   discussed with patient.  Side effects of Arimidex including but not limited to hot flush, joint pain, fatigue, mood swing, osteoporosis discussed with patient. Patient voices understanding and willing to proceed.   Baseline bone density was normal in 2020.  Due to repeat another bone density in August 2022  #Chronic microcytic anemia, normal hemoglobinopathy evaluation.  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 Repeat CBC at the next visit.  No orders of the defined types were placed in this encounter.   All questions were answered. The patient knows to call the clinic with any problems questions or concerns.   Return of visit: 6 weeks   Earlie Server, MD, PhD Hematology Oncology [facility] Pager- [phone] 02/03/2020

## 2020-02-26 ENCOUNTER — Other Ambulatory Visit: Payer: Self-pay | Admitting: Family

## 2020-03-17 ENCOUNTER — Inpatient Hospital Stay: Payer: Medicare Other

## 2020-03-17 ENCOUNTER — Inpatient Hospital Stay: Payer: Medicare Other | Admitting: Oncology

## 2020-04-06 ENCOUNTER — Inpatient Hospital Stay: Payer: Medicare Other

## 2020-04-06 ENCOUNTER — Inpatient Hospital Stay: Payer: Medicare Other | Attending: Oncology | Admitting: Oncology

## 2020-04-06 ENCOUNTER — Encounter: Payer: Self-pay | Admitting: Oncology

## 2020-04-06 VITALS — BP 141/82 | HR 85 | Temp 97.8°F | Resp 18 | Wt 157.9 lb

## 2020-04-06 DIAGNOSIS — Z9011 Acquired absence of right breast and nipple: Secondary | ICD-10-CM | POA: Insufficient documentation

## 2020-04-06 DIAGNOSIS — Z807 Family history of other malignant neoplasms of lymphoid, hematopoietic and related tissues: Secondary | ICD-10-CM | POA: Diagnosis not present

## 2020-04-06 DIAGNOSIS — Z170 Estrogen receptor positive status [ER+]: Secondary | ICD-10-CM | POA: Diagnosis not present

## 2020-04-06 DIAGNOSIS — Z87891 Personal history of nicotine dependence: Secondary | ICD-10-CM | POA: Insufficient documentation

## 2020-04-06 DIAGNOSIS — Z7984 Long term (current) use of oral hypoglycemic drugs: Secondary | ICD-10-CM | POA: Insufficient documentation

## 2020-04-06 DIAGNOSIS — D509 Iron deficiency anemia, unspecified: Secondary | ICD-10-CM

## 2020-04-06 DIAGNOSIS — Z801 Family history of malignant neoplasm of trachea, bronchus and lung: Secondary | ICD-10-CM | POA: Diagnosis not present

## 2020-04-06 DIAGNOSIS — C50911 Malignant neoplasm of unspecified site of right female breast: Secondary | ICD-10-CM | POA: Diagnosis present

## 2020-04-06 DIAGNOSIS — Z8042 Family history of malignant neoplasm of prostate: Secondary | ICD-10-CM | POA: Diagnosis not present

## 2020-04-06 DIAGNOSIS — I10 Essential (primary) hypertension: Secondary | ICD-10-CM | POA: Insufficient documentation

## 2020-04-06 DIAGNOSIS — Z803 Family history of malignant neoplasm of breast: Secondary | ICD-10-CM | POA: Insufficient documentation

## 2020-04-06 DIAGNOSIS — Z79899 Other long term (current) drug therapy: Secondary | ICD-10-CM | POA: Insufficient documentation

## 2020-04-06 DIAGNOSIS — Z8249 Family history of ischemic heart disease and other diseases of the circulatory system: Secondary | ICD-10-CM | POA: Insufficient documentation

## 2020-04-06 DIAGNOSIS — Z79811 Long term (current) use of aromatase inhibitors: Secondary | ICD-10-CM | POA: Insufficient documentation

## 2020-04-06 LAB — CBC WITH DIFFERENTIAL/PLATELET
Abs Immature Granulocytes: 0.01 10*3/uL (ref 0.00–0.07)
Basophils Absolute: 0 10*3/uL (ref 0.0–0.1)
Basophils Relative: 1 %
Eosinophils Absolute: 0.2 10*3/uL (ref 0.0–0.5)
Eosinophils Relative: 3 %
HCT: 34.8 % — ABNORMAL LOW (ref 36.0–46.0)
Hemoglobin: 10.9 g/dL — ABNORMAL LOW (ref 12.0–15.0)
Immature Granulocytes: 0 %
Lymphocytes Relative: 33 %
Lymphs Abs: 1.9 10*3/uL (ref 0.7–4.0)
MCH: 24.9 pg — ABNORMAL LOW (ref 26.0–34.0)
MCHC: 31.3 g/dL (ref 30.0–36.0)
MCV: 79.6 fL — ABNORMAL LOW (ref 80.0–100.0)
Monocytes Absolute: 0.6 10*3/uL (ref 0.1–1.0)
Monocytes Relative: 10 %
Neutro Abs: 3.1 10*3/uL (ref 1.7–7.7)
Neutrophils Relative %: 53 %
Platelets: 206 10*3/uL (ref 150–400)
RBC: 4.37 MIL/uL (ref 3.87–5.11)
RDW: 14 % (ref 11.5–15.5)
WBC: 5.7 10*3/uL (ref 4.0–10.5)
nRBC: 0 % (ref 0.0–0.2)

## 2020-04-06 LAB — COMPREHENSIVE METABOLIC PANEL
ALT: 14 U/L (ref 0–44)
AST: 19 U/L (ref 15–41)
Albumin: 4 g/dL (ref 3.5–5.0)
Alkaline Phosphatase: 65 U/L (ref 38–126)
Anion gap: 8 (ref 5–15)
BUN: 22 mg/dL (ref 8–23)
CO2: 28 mmol/L (ref 22–32)
Calcium: 9.5 mg/dL (ref 8.9–10.3)
Chloride: 99 mmol/L (ref 98–111)
Creatinine, Ser: 1.04 mg/dL — ABNORMAL HIGH (ref 0.44–1.00)
GFR, Estimated: 55 mL/min — ABNORMAL LOW (ref >60)
Glucose, Bld: 116 mg/dL — ABNORMAL HIGH (ref 70–99)
Potassium: 4.4 mmol/L (ref 3.5–5.1)
Sodium: 135 mmol/L (ref 135–145)
Total Bilirubin: 0.6 mg/dL (ref 0.3–1.2)
Total Protein: 7.6 g/dL (ref 6.5–8.1)

## 2020-04-06 LAB — RETIC PANEL
Immature Retic Fract: 8.7 % (ref 2.3–15.9)
RBC.: 4.36 MIL/uL (ref 3.87–5.11)
Retic Count, Absolute: 58.9 10*3/uL (ref 19.0–186.0)
Retic Ct Pct: 1.4 % (ref 0.4–3.1)
Reticulocyte Hemoglobin: 29.5 pg (ref >27.9)

## 2020-04-06 NOTE — Progress Notes (Signed)
Pt here for follow up. No new concerns, no new breast problems 

## 2020-04-06 NOTE — Progress Notes (Signed)
Hematology/Oncology Consult note [facility] [phone] [phone]   Patient Care Team: Burnard Hawthorne, FNP as PCP - General (Family Medicine) Theodore Demark, RN as Oncology Nurse Navigator (Oncology) Noreene Filbert, MD as Radiation Oncologist (Radiation Oncology)  REFERRING PROVIDER: Burnard Hawthorne, FNP  CHIEF COMPLAINTS/REASON FOR VISIT:  Evaluation for breast cancer  PERTINENT ONCOLOGY HISTORY Tracy Lawrence is a  80 y.o.  female with PMH listed below was seen in consultation at the request of  Burnard Hawthorne, FNP  for evaluation of abnormal SPEP. Reviewed patient's previous blood work via care everywhere. 08/27/2018, free light chain ratio 1.97, free kappa 79.5, free lambda 40.3. Protein electrophoresis showed increased alpha globulin.  #Patient moved from [place] to [place] last year. She reports history of right breast cancer in 2002 and left breast cancer in 2011.  She underwent right breast lumpectomy followed by adjuvant radiation followed by 5 years of tamoxifen.  Underwent left lumpectomy followed by adjuvant radiation followed by aromatase inhibitor for 5 years.  Currently she is not on any antiestrogen treatment.  Denies any chemotherapy treatment history.  Both breast cancer treatments were done in [place]. Obtained records from [facility] clinic breast surgery [place] [facility] Records were reviewed.  No pathology is available. 06/06/2017 screening mammogram showed postoperative changes in both breasts.  Architectural distortion has not changed significantly.  Skin thickening is similar.  Calcifications have increased mildly around the postoperative sites bilaterally.  No new dominant mass.   # Chronic history of kidney insufficiency, stage IIIb.  Patient sees Dr. Holley Raring.  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  # Breast exam was performed in seated and lying  down position. Right breast lower inner quadrant lumpectomy scar with focal scar tissue. Left breast upper outer quadrant, 1:00 lumpectomy scar with palpable round thickened breast tissue. No palpable axillary lymphadenopathy.  # #Family history of breast cancer in first-degree relatives, patient reports that she was previously tested negative for BRCA gene mutation.  Results were not available. Patient was referred to genetic counselor and 11/19/2019 [facility] Breast Cancer STAT Panel + Multi- cancer panel found no pathogenic mutations.   # 09/17/2019 bilateral screening mammogram showed right breast mass which warrants further evaluation.  No suspicious findings in left breast. 09/25/2019, right diagnostic breast mammogram showed specialist right breast mass at 10:00. Right axilla is negative for adenopathy.  Patient underwent ultrasound-guided core biopsy of the right breast 10:00 location. 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  Grade 2, no DCIS or LVI.  ER 90%, PR 11-50%, HER-2 negative.  Chronic microcytic anemia, iron panel is not consistent with iron deficiency. Normal hemoglobin evaluation.  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 Hemoglobin stable at 11.6. GI work-up includes EGD and colonoscopy on 06/14/2019.  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  Benign colon polyps.  No high-grade dysplasia or malignancy.  She follows up with Dr. Marius Ditch Anemia can be also secondary to CKD.  # #01/06/2020 patient underwent right mastectomy and sentinel lymph node biopsy.  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  No adjuvant chemotherapy will be offered She had mastectomy with negative margin, no need for adjuvant radiation. Baseline elevated CA 27.29.   #INTERVAL  HISTORY Tracy Lawrence  is a 80 y.o. female who has above history reviewed by me today presents for follow up visit for breast cancer.  Problems and complaints are listed below: Patient reports that her husband just recently had hiatal hernia surgery.  Doing well. She has no new complaints.  She was seen by Dr. Bary Castilla 04/02/2020 for seroma.  Patient recovers well. She has started on Arimidex 1 mg daily for about a week.  So far tolerated well.  Review of Systems  Constitutional: Negative for appetite change, chills, fatigue and fever.  HENT:   Negative for hearing loss and voice change.   Eyes: Negative for eye problems.  Respiratory: Negative for chest tightness and cough.   Cardiovascular: Negative for chest pain.  Gastrointestinal: Negative for abdominal distention, abdominal pain and blood in stool.  Endocrine: Negative for hot flashes.  Genitourinary: Negative for difficulty urinating and frequency.   Musculoskeletal: Negative for arthralgias.  Skin: Negative for itching and rash.  Neurological: Negative for extremity weakness.  Hematological: Negative for adenopathy.  Psychiatric/Behavioral: Negative for confusion.   Right mastectomy MEDICAL HISTORY:  Past Medical History:  Diagnosis Date  . Anemia   . Arthritis   . Breast cancer [facility]) 2002   right breast  . Breast cancer ([place]) 2011   left breast  . Cancer ([place])   . Family history of breast cancer   . Family history of lung cancer   . Family history of non-Hodgkin's lymphoma   . Family history of prostate cancer   . Hypertension   . Malignant neoplasm of right breast ([place])   . Personal history of radiation therapy 2002, 2011   right and left breast  . Pre-diabetes     SURGICAL HISTORY: Past Surgical History:  Procedure Laterality Date  . ABDOMINAL HYSTERECTOMY  2018  . BREAST BIOPSY Right 10/04/2018   u/s bx, vision marker, path pending  . BREAST LUMPECTOMY Right 2002   breast ca with rad  . BREAST  LUMPECTOMY Left 2011   breast ca with rad  . BREAST SURGERY  2011,2002  . CATARACT EXTRACTION  2015  . COLONOSCOPY    . COLONOSCOPY WITH PROPOFOL N/A 06/14/2019   Procedure: COLONOSCOPY WITH PROPOFOL;  Surgeon: Lin Landsman, MD;  Location: [facility] ENDOSCOPY;  Service: Gastroenterology;  Laterality: N/A;  . ESOPHAGOGASTRODUODENOSCOPY (EGD) WITH PROPOFOL N/A 06/14/2019   Procedure: ESOPHAGOGASTRODUODENOSCOPY (EGD) WITH PROPOFOL;  Surgeon: Lin Landsman, MD;  Location: [facility] ENDOSCOPY;  Service: Gastroenterology;  Laterality: N/A;  . SIMPLE MASTECTOMY WITH AXILLARY SENTINEL NODE BIOPSY Right 01/06/2020   Procedure: SIMPLE MASTECTOMY WITH AXILLARY SENTINEL NODE BIOPSY;  Surgeon: Robert Bellow, MD;  Location: [facility] ORS;  Service: General;  Laterality: Right;    SOCIAL HISTORY: Social History   Socioeconomic History  . Marital status: Married    Spouse name: Not on file  . Number of children: Not on file  . Years of education: Not on file  . Highest education level: Not on file  Occupational History  . Not on file  Tobacco Use  . Smoking status: Former Smoker    Packs/day: 1.00    Years: 12.00    Pack years: 12.00    Quit date: 03/18/1979    Years since quitting: 41.0  . Smokeless tobacco: Never Used  . Tobacco comment: quit 40 years ago  Vaping Use  . Vaping Use: Never used  Substance and Sexual Activity  . Alcohol use: Yes    Comment: rarely  . Drug use: Never  .  Sexual activity: Not on file  Other Topics Concern  . Not on file  Social History Narrative   From [place]      Retired      Social Determinants of Radio broadcast assistant Strain: [place]   . Difficulty of Paying Living Expenses: Not hard at all  Food Insecurity: No Food Insecurity  . Worried About Charity fundraiser in the Last Year: Never true  . Ran Out of Food in the Last Year: Never true  Transportation Needs: No Transportation Needs  . Lack of Transportation (Medical): No  . Lack of  Transportation (Non-Medical): No  Physical Activity: Not on file  Stress: No Stress Concern Present  . Feeling of Stress : Not at all  Social Connections: Unknown  . Frequency of Communication with Friends and Family: Not on file  . Frequency of Social Gatherings with Friends and Family: Not on file  . Attends Religious Services: Not on file  . Active Member of Clubs or Organizations: Not on file  . Attends Archivist Meetings: Not on file  . Marital Status: Married  Human resources officer Violence: Not At Risk  . Fear of Current or Ex-Partner: No  . Emotionally Abused: No  . Physically Abused: No  . Sexually Abused: No    FAMILY HISTORY: Family History  Problem Relation Age of Onset  . Heart attack Mother   . Cancer Father   . Lung cancer Father   . Breast cancer Sister 23       dx again 78  . Heart attack Sister   . Non-Hodgkin's lymphoma Sister 27  . Heart attack Sister   . Dementia Sister 64  . Breast cancer Paternal Aunt   . Breast cancer Maternal Aunt   . Prostate cancer Paternal Grandfather        metastic, d. 43s  . Colon cancer Neg Hx     ALLERGIES:  has No Known Allergies.  MEDICATIONS:  Current Outpatient Medications  Medication Sig Dispense Refill  . acetaminophen (TYLENOL) 500 MG tablet Take 1,000 mg by mouth every 6 (six) hours as needed for moderate pain or headache.    . allopurinol (ZYLOPRIM) 100 MG tablet TAKE 1 TABLET(100 MG) BY MOUTH DAILY 30 tablet 3  . anastrozole (ARIMIDEX) 1 MG tablet Take 1 tablet (1 mg total) by mouth daily. 30 tablet 3  . Calcium Carb-Cholecalciferol (CALCIUM 600 + D PO) Take 1 tablet by mouth daily.    . calcium carbonate (TUMS - DOSED IN MG ELEMENTAL CALCIUM) 500 MG chewable tablet Chew 1,000 mg by mouth daily as needed for indigestion or heartburn.    . Carboxymethylcellul-Glycerin (LUBRICATING EYE DROPS OP) Place 1 drop into both eyes daily as needed (irritated eyes).    Marland Kitchen escitalopram (LEXAPRO) 10 MG tablet TAKE 1  TABLET BY MOUTH EVERY DAY FOR DEPRESSION 90 tablet 0  . HYDROcodone-acetaminophen (NORCO/VICODIN) 5-325 MG tablet Take 1 tablet by mouth every 4 (four) hours as needed for moderate pain. 12 tablet 0  . losartan (COZAAR) 50 MG tablet Take 1 tablet (50 mg total) by mouth at bedtime. 90 tablet 1  . metFORMIN (GLUCOPHAGE) 500 MG tablet Take 500 mg by mouth 2 (two) times daily with a meal.    . Multiple Vitamins-Minerals (EYE VITAMINS PO) Take 1 tablet by mouth daily.     . rosuvastatin (CRESTOR) 40 MG tablet TAKE 1 TABLET BY MOUTH EVERY DAY FOR CHOLESTEROL 90 tablet 1   No current facility-administered  medications for this visit.     PHYSICAL EXAMINATION: ECOG PERFORMANCE STATUS: 0 - Asymptomatic Vitals:   04/06/20 1448  BP: (!) 141/82  Pulse: 85  Resp: 18  Temp: 97.8 F (36.6 C)   Filed Weights   04/06/20 1448  Weight: 157 lb 14.4 oz (71.6 kg)    Physical Exam Constitutional:      General: She is not in acute distress. HENT:     Head: Normocephalic and atraumatic.  Eyes:     General: No scleral icterus. Cardiovascular:     Rate and Rhythm: Normal rate and regular rhythm.     Heart sounds: Normal heart sounds.  Pulmonary:     Effort: Pulmonary effort is normal. No respiratory distress.     Breath sounds: No wheezing.  Abdominal:     General: Bowel sounds are normal. There is no distension.     Palpations: Abdomen is soft.  Musculoskeletal:        General: No deformity. Normal range of motion.     Cervical back: Normal range of motion and neck supple.  Skin:    General: Skin is warm and dry.     Findings: No erythema or rash.  Neurological:     Mental Status: She is alert and oriented to person, place, and time. Mental status is at baseline.     Cranial Nerves: No cranial nerve deficit.     Coordination: Coordination normal.  Psychiatric:        Mood and Affect: Mood normal.     LABORATORY DATA:  I have reviewed the data as listed Lab Results  Component Value  Date   WBC 5.7 04/06/2020   HGB 10.9 (L) 04/06/2020   HCT 34.8 (L) 04/06/2020   MCV 79.6 (L) 04/06/2020   PLT 206 04/06/2020   Recent Labs    10/02/19 1324 10/14/19 1445 04/06/20 1411  NA 136 136 135  K 4.8 4.4 4.4  CL 100 101 99  CO2 '27 26 28  ' GLUCOSE 133* 122* 116*  BUN 24* 21 22  CREATININE 1.19* 0.99 1.04*  CALCIUM 9.1 9.5 9.5  GFRNONAA 44* 55* 55*  GFRAA 51* >60  --   PROT 7.3 7.9 7.6  ALBUMIN 3.9 4.1 4.0  AST '20 21 19  ' ALT '16 19 14  ' ALKPHOS 76 73 65  BILITOT 0.3 0.5 0.6   Iron/TIBC/Ferritin/ %Sat    Component Value Date/Time   IRON 45 10/02/2019 1324   TIBC 283 10/02/2019 1324   FERRITIN 106 10/02/2019 1324   IRONPCTSAT 16 10/02/2019 1324      RADIOGRAPHIC STUDIES: I have personally reviewed the radiological images as listed and agreed with the findings in the report. No results found.     ASSESSMENT & PLAN:  1. Malignant neoplasm of right breast in female, estrogen receptor positive, unspecified site of breast ([place])   2. Microcytic anemia    #Right breast invasive carcinoma, grade 2 ER 90%/PR 11-50%/HER-2 negative pT1b pN0 status post right mastectomy. Clinically doing well Labs reviewed and discussed with the patient Continue Arimidex 1 mg daily Baseline elevated CA 27.29.  A repeat level is pending at the time of dictation. . Baseline bone density was normal in 2020.  Due to repeat another bone density in August 2022 Recommend patient to continue calcium and vitamin D supplementation.  #Chronic microcytic anemia, normal hemoglobinopathy evaluation.  Possible alpha thalassemia trait, result is pending. Orders Placed This Encounter  Procedures  . CBC with Differential/Platelet    Standing  Status:   Future    Standing Expiration Date:   04/06/2021  . Comprehensive metabolic panel    Standing Status:   Future    Standing Expiration Date:   04/06/2021    All questions were answered. The patient knows to call the clinic with any problems questions  or concerns.   Return of visit:  3 months.   Earlie Server, MD, PhD Hematology Oncology [facility] Pager- [phone] 04/06/2020

## 2020-04-08 ENCOUNTER — Encounter: Payer: Self-pay | Admitting: Family

## 2020-04-08 ENCOUNTER — Other Ambulatory Visit: Payer: Self-pay

## 2020-04-08 ENCOUNTER — Ambulatory Visit (INDEPENDENT_AMBULATORY_CARE_PROVIDER_SITE_OTHER): Payer: Medicare Other | Admitting: Family

## 2020-04-08 VITALS — BP 142/68 | HR 79 | Temp 97.4°F | Ht 61.0 in | Wt 158.5 lb

## 2020-04-08 DIAGNOSIS — I10 Essential (primary) hypertension: Secondary | ICD-10-CM

## 2020-04-08 DIAGNOSIS — F32A Depression, unspecified: Secondary | ICD-10-CM

## 2020-04-08 DIAGNOSIS — E1169 Type 2 diabetes mellitus with other specified complication: Secondary | ICD-10-CM

## 2020-04-08 DIAGNOSIS — E119 Type 2 diabetes mellitus without complications: Secondary | ICD-10-CM | POA: Diagnosis not present

## 2020-04-08 DIAGNOSIS — E1122 Type 2 diabetes mellitus with diabetic chronic kidney disease: Secondary | ICD-10-CM | POA: Diagnosis not present

## 2020-04-08 DIAGNOSIS — E785 Hyperlipidemia, unspecified: Secondary | ICD-10-CM

## 2020-04-08 DIAGNOSIS — N183 Chronic kidney disease, stage 3 unspecified: Secondary | ICD-10-CM

## 2020-04-08 DIAGNOSIS — F329 Major depressive disorder, single episode, unspecified: Secondary | ICD-10-CM

## 2020-04-08 LAB — CANCER ANTIGEN 27.29: CA 27.29: 17.8 U/mL (ref 0.0–38.6)

## 2020-04-08 MED ORDER — LOSARTAN POTASSIUM 100 MG PO TABS: 100.0000 mg | ORAL_TABLET | Freq: Every day | ORAL | 3 refills | Status: DC

## 2020-04-08 NOTE — Patient Instructions (Addendum)
You are currently on losartan 50mg . We will need to increase this medication. You had been on losartan 100mg . I have sent losartan 100mg  back in for you to your pharmacy. Fasting Labs in one week  It is imperative that you are seen AT least twice per year for labs and monitoring. Monitor blood pressure at home and me 5-6 reading on separate days. Goal is less than 120/80, based on newest guidelines, however we certainly want to be less than 130/80;  if persistently higher, please make sooner follow up appointment so we can recheck you blood pressure and manage/ adjust medications.  Nice to see you!  Managing Your Hypertension Hypertension, also called high blood pressure, is when the force of the blood pressing against the walls of the arteries is too strong. Arteries are blood vessels that carry blood from your heart throughout your body. Hypertension forces the heart to work harder to pump blood and may cause the arteries to become narrow or stiff. Understanding blood pressure readings Your personal target blood pressure may vary depending on your medical conditions, your age, and other factors. A blood pressure reading includes a higher number over a lower number. Ideally, your blood pressure should be below 120/80. You should know that:  The first, or top, number is called the systolic pressure. It is a measure of the pressure in your arteries as your heart beats.  The second, or bottom number, is called the diastolic pressure. It is a measure of the pressure in your arteries as the heart relaxes. Blood pressure is classified into four stages. Based on your blood pressure reading, your health care provider may use the following stages to determine what type of treatment you need, if any. Systolic pressure and diastolic pressure are measured in a unit called mmHg. Normal  Systolic pressure: below 572.  Diastolic pressure: below 80. Elevated  Systolic pressure: 620-355.  Diastolic pressure:  below 80. Hypertension stage 1  Systolic pressure: 974-163.  Diastolic pressure: 84-53. Hypertension stage 2  Systolic pressure: 646 or above.  Diastolic pressure: 90 or above. How can this condition affect me? Managing your hypertension is an important responsibility. Over time, hypertension can damage the arteries and decrease blood flow to important parts of the body, including the brain, heart, and kidneys. Having untreated or uncontrolled hypertension can lead to:  A heart attack.  A stroke.  A weakened blood vessel (aneurysm).  Heart failure.  Kidney damage.  Eye damage.  Metabolic syndrome.  Memory and concentration problems.  Vascular dementia. What actions can I take to manage this condition? Hypertension can be managed by making lifestyle changes and possibly by taking medicines. Your health care provider will help you make a plan to bring your blood pressure within a normal range. Nutrition  Eat a diet that is high in fiber and potassium, and low in salt (sodium), added sugar, and fat. An example eating plan is called the Dietary Approaches to Stop Hypertension (DASH) diet. To eat this way: ? Eat plenty of fresh fruits and vegetables. Try to fill one-half of your plate at each meal with fruits and vegetables. ? Eat whole grains, such as whole-wheat pasta, brown rice, or whole-grain bread. Fill about one-fourth of your plate with whole grains. ? Eat low-fat dairy products. ? Avoid fatty cuts of meat, processed or cured meats, and poultry with skin. Fill about one-fourth of your plate with lean proteins such as fish, chicken without skin, beans, eggs, and tofu. ? Avoid pre-made and processed  foods. These tend to be higher in sodium, added sugar, and fat.  Reduce your daily sodium intake. Most people with hypertension should eat less than 1,500 mg of sodium a day.   Lifestyle  Work with your health care provider to maintain a healthy body weight or to lose weight.  Ask what an ideal weight is for you.  Get at least 30 minutes of exercise that causes your heart to beat faster (aerobic exercise) most days of the week. Activities may include walking, swimming, or biking.  Include exercise to strengthen your muscles (resistance exercise), such as weight lifting, as part of your weekly exercise routine. Try to do these types of exercises for 30 minutes at least 3 days a week.  Do not use any products that contain nicotine or tobacco, such as cigarettes, e-cigarettes, and chewing tobacco. If you need help quitting, ask your health care provider.  Control any long-term (chronic) conditions you have, such as high cholesterol or diabetes.  Identify your sources of stress and find ways to manage stress. This may include meditation, deep breathing, or making time for fun activities.   Alcohol use  Do not drink alcohol if: ? Your health care provider tells you not to drink. ? You are pregnant, may be pregnant, or are planning to become pregnant.  If you drink alcohol: ? Limit how much you use to:  0-1 drink a day for women.  0-2 drinks a day for men. ? Be aware of how much alcohol is in your drink. In the [place] one drink equals one 12 oz bottle of beer (355 mL), one 5 oz glass of wine (148 mL), or one 1 oz glass of hard liquor (44 mL). Medicines Your health care provider may prescribe medicine if lifestyle changes are not enough to get your blood pressure under control and if:  Your systolic blood pressure is 130 or higher.  Your diastolic blood pressure is 80 or higher. Take medicines only as told by your health care provider. Follow the directions carefully. Blood pressure medicines must be taken as told by your health care provider. The medicine does not work as well when you skip doses. Skipping doses also puts you at risk for problems. Monitoring Before you monitor your blood pressure:  Do not smoke, drink caffeinated beverages, or exercise within  30 minutes before taking a measurement.  Use the bathroom and empty your bladder (urinate).  Sit quietly for at least 5 minutes before taking measurements. Monitor your blood pressure at home as told by your health care provider. To do this:  Sit with your back straight and supported.  Place your feet flat on the floor. Do not cross your legs.  Support your arm on a flat surface, such as a table. Make sure your upper arm is at heart level.  Each time you measure, take two or three readings one minute apart and record the results. You may also need to have your blood pressure checked regularly by your health care provider.   General information  Talk with your health care provider about your diet, exercise habits, and other lifestyle factors that may be contributing to hypertension.  Review all the medicines you take with your health care provider because there may be side effects or interactions.  Keep all visits as told by your health care provider. Your health care provider can help you create and adjust your plan for managing your high blood pressure. Where to find more information  National Heart,  Lung, and Blood Institute: https://wilson-eaton.com/  American Heart Association: www.heart.org Contact a health care provider if:  You think you are having a reaction to medicines you have taken.  You have repeated (recurrent) headaches.  You feel dizzy.  You have swelling in your ankles.  You have trouble with your vision. Get help right away if:  You develop a severe headache or confusion.  You have unusual weakness or numbness, or you feel faint.  You have severe pain in your chest or abdomen.  You vomit repeatedly.  You have trouble breathing. These symptoms may represent a serious problem that is an emergency. Do not wait to see if the symptoms will go away. Get medical help right away. Call your local emergency services (911 in the [place] Do not drive yourself to the  hospital. Summary  Hypertension is when the force of blood pumping through your arteries is too strong. If this condition is not controlled, it may put you at risk for serious complications.  Your personal target blood pressure may vary depending on your medical conditions, your age, and other factors. For most people, a normal blood pressure is less than 120/80.  Hypertension is managed by lifestyle changes, medicines, or both.  Lifestyle changes to help manage hypertension include losing weight, eating a healthy, low-sodium diet, exercising more, stopping smoking, and limiting alcohol. This information is not intended to replace advice given to you by your health care provider. Make sure you discuss any questions you have with your health care provider. Document Revised: 02/22/2019 Document Reviewed: 12/18/2018 Elsevier Patient Education  2021 [employer].

## 2020-04-08 NOTE — Assessment & Plan Note (Signed)
Controlled. Continue lexapro 10mg 

## 2020-04-08 NOTE — Assessment & Plan Note (Signed)
Anticipate controlled. Pending lipid panel. Continue crestor 40mg .

## 2020-04-08 NOTE — Assessment & Plan Note (Signed)
Pending a1c. Historically controlled. Continue metformin 500mg  BID.

## 2020-04-08 NOTE — Assessment & Plan Note (Addendum)
Uncontrolled. For some reason she came off the hyzaar 100-25mg . We will increase losartan 100mg  . Labs in one week to monitor renal function.

## 2020-04-08 NOTE — Progress Notes (Signed)
Subjective:    Patient ID: Tracy Lawrence, female    DOB: 1940/08/17, 80 y.o.   MRN: [id number]  CC: Tracy Lawrence is a 80 y.o. female who presents today for follow up.   HPI: Feels well today No complaints  HTN- At home reports SBP in 140s.  some reason she is no longer on Hyzaar 100-25. She is only on losartan 50mg . No cp. Sob.   DM- compliant metformin 500mg  BID  CKD- Dr Holley Raring, last seen 02/04/20  Depression- feels well on lexapro 10mg . No si/hi. Sleeping well.   HLD- compliant with crestor 40mg   Right breast cancer and chronic microcytic anemia-She is starting arimidex this as prescribed by Dr Tasia Catchings. She has seen Dr Bary Castilla   Pending thalassemia lab from Dr Tasia Catchings   Colonoscopy 06/2019  HISTORY:  Past Medical History:  Diagnosis Date  . Anemia   . Arthritis   . Breast cancer [facility]) 2002   right breast  . Breast cancer ([place]) 2011   left breast  . Cancer ([place])   . Family history of breast cancer   . Family history of lung cancer   . Family history of non-Hodgkin's lymphoma   . Family history of prostate cancer   . Hypertension   . Malignant neoplasm of right breast ([place])   . Personal history of radiation therapy 2002, 2011   right and left breast  . Pre-diabetes    Past Surgical History:  Procedure Laterality Date  . ABDOMINAL HYSTERECTOMY  2018  . BREAST BIOPSY Right 10/04/2018   u/s bx, vision marker, path pending  . BREAST LUMPECTOMY Right 2002   breast ca with rad  . BREAST LUMPECTOMY Left 2011   breast ca with rad  . BREAST SURGERY  2011,2002  . CATARACT EXTRACTION  2015  . COLONOSCOPY    . COLONOSCOPY WITH PROPOFOL N/A 06/14/2019   Procedure: COLONOSCOPY WITH PROPOFOL;  Surgeon: Lin Landsman, MD;  Location: [facility] ENDOSCOPY;  Service: Gastroenterology;  Laterality: N/A;  . ESOPHAGOGASTRODUODENOSCOPY (EGD) WITH PROPOFOL N/A 06/14/2019   Procedure: ESOPHAGOGASTRODUODENOSCOPY (EGD) WITH PROPOFOL;  Surgeon: Lin Landsman, MD;  Location: [facility]  ENDOSCOPY;  Service: Gastroenterology;  Laterality: N/A;  . SIMPLE MASTECTOMY WITH AXILLARY SENTINEL NODE BIOPSY Right 01/06/2020   Procedure: SIMPLE MASTECTOMY WITH AXILLARY SENTINEL NODE BIOPSY;  Surgeon: Robert Bellow, MD;  Location: [facility] ORS;  Service: General;  Laterality: Right;   Family History  Problem Relation Age of Onset  . Heart attack Mother   . Cancer Father   . Lung cancer Father   . Breast cancer Sister 55       dx again 83  . Heart attack Sister   . Non-Hodgkin's lymphoma Sister 57  . Heart attack Sister   . Dementia Sister 29  . Breast cancer Paternal Aunt   . Breast cancer Maternal Aunt   . Prostate cancer Paternal Grandfather        metastic, d. 75s  . Colon cancer Neg Hx     Allergies: Patient has no known allergies. Current Outpatient Medications on File Prior to Visit  Medication Sig Dispense Refill  . acetaminophen (TYLENOL) 500 MG tablet Take 1,000 mg by mouth every 6 (six) hours as needed for moderate pain or headache.    . allopurinol (ZYLOPRIM) 100 MG tablet TAKE 1 TABLET(100 MG) BY MOUTH DAILY 30 tablet 3  . anastrozole (ARIMIDEX) 1 MG tablet Take 1 tablet (1 mg total) by mouth daily. 30 tablet 3  .  Calcium Carb-Cholecalciferol (CALCIUM 600 + D PO) Take 1 tablet by mouth daily.    . calcium carbonate (TUMS - DOSED IN MG ELEMENTAL CALCIUM) 500 MG chewable tablet Chew 1,000 mg by mouth daily as needed for indigestion or heartburn.    . Carboxymethylcellul-Glycerin (LUBRICATING EYE DROPS OP) Place 1 drop into both eyes daily as needed (irritated eyes).    Marland Kitchen escitalopram (LEXAPRO) 10 MG tablet TAKE 1 TABLET BY MOUTH EVERY DAY FOR DEPRESSION 90 tablet 0  . ferrous sulfate 325 (65 FE) MG tablet Take 325 mg by mouth daily with breakfast.    . metFORMIN (GLUCOPHAGE) 500 MG tablet Take 500 mg by mouth 2 (two) times daily with a meal.    . Multiple Vitamins-Minerals (EYE VITAMINS PO) Take 1 tablet by mouth daily.     . rosuvastatin (CRESTOR) 40 MG tablet TAKE  1 TABLET BY MOUTH EVERY DAY FOR CHOLESTEROL 90 tablet 1   No current facility-administered medications on file prior to visit.    Social History   Tobacco Use  . Smoking status: Former Smoker    Packs/day: 1.00    Years: 12.00    Pack years: 12.00    Quit date: 03/18/1979    Years since quitting: 41.0  . Smokeless tobacco: Never Used  . Tobacco comment: quit 40 years ago  Vaping Use  . Vaping Use: Never used  Substance Use Topics  . Alcohol use: Yes    Comment: rarely  . Drug use: Never    Review of Systems  Constitutional: Negative for chills and fever.  Respiratory: Negative for cough.   Cardiovascular: Negative for chest pain and palpitations.  Gastrointestinal: Negative for nausea and vomiting.      Objective:    BP (!) 142/68   Pulse 79   Temp (!) 97.4 F (36.3 C)   Ht 5\' 1"  (1.549 m)   Wt 158 lb 8 oz (71.9 kg)   LMP  (LMP Unknown)   SpO2 98%   BMI 29.95 kg/m  BP Readings from Last 3 Encounters:  04/08/20 (!) 142/68  04/06/20 (!) 141/82  02/03/20 (!) 154/86   Wt Readings from Last 3 Encounters:  04/08/20 158 lb 8 oz (71.9 kg)  04/06/20 157 lb 14.4 oz (71.6 kg)  02/03/20 159 lb 11.2 oz (72.4 kg)    Physical Exam Vitals reviewed.  Constitutional:      Appearance: She is well-developed and well-nourished.  Eyes:     Conjunctiva/sclera: Conjunctivae normal.  Cardiovascular:     Rate and Rhythm: Normal rate and regular rhythm.     Pulses: Normal pulses.     Heart sounds: Normal heart sounds.  Pulmonary:     Effort: Pulmonary effort is normal.     Breath sounds: Normal breath sounds. No wheezing, rhonchi or rales.  Skin:    General: Skin is warm and dry.  Neurological:     Mental Status: She is alert.  Psychiatric:        Mood and Affect: Mood and affect normal.        Speech: Speech normal.        Behavior: Behavior normal.        Thought Content: Thought content normal.        Assessment & Plan:   Problem List Items Addressed This  Visit      Cardiovascular and Mediastinum   Essential hypertension - Primary    Uncontrolled. For some reason she came off the hyzaar 100-25mg . We will increase losartan  100mg  . Labs in one week to monitor renal function.      Relevant Medications   losartan (COZAAR) 100 MG tablet   Other Relevant Orders   Comprehensive metabolic panel     Endocrine   CKD stage 3 due to type 2 diabetes mellitus (HCC)   Relevant Medications   losartan (COZAAR) 100 MG tablet   Other Relevant Orders   Hemoglobin A1c   Diabetes mellitus without complication (HCC)    Pending a1c. Historically controlled. Continue metformin 500mg  BID.       Relevant Medications   losartan (COZAAR) 100 MG tablet   Hyperlipidemia associated with type 2 diabetes mellitus ([place])    Anticipate controlled. Pending lipid panel. Continue crestor 40mg .       Relevant Medications   losartan (COZAAR) 100 MG tablet   Other Relevant Orders   Lipid panel   Comprehensive metabolic panel     Other   Depressive disorder    Controlled. Continue lexapro 10mg           I have discontinued Bjorn Loser. Piscopo's losartan and HYDROcodone-acetaminophen. I am also having her start on losartan. Additionally, I am having her maintain her Multiple Vitamins-Minerals (EYE VITAMINS PO), Calcium Carb-Cholecalciferol (CALCIUM 600 + D PO), acetaminophen, calcium carbonate, Carboxymethylcellul-Glycerin (LUBRICATING EYE DROPS OP), allopurinol, metFORMIN, rosuvastatin, anastrozole, escitalopram, and ferrous sulfate.   Meds ordered this encounter  Medications  . losartan (COZAAR) 100 MG tablet    Sig: Take 1 tablet (100 mg total) by mouth daily.    Dispense:  90 tablet    Refill:  3    Order Specific Question:   Supervising Provider    Answer:   Crecencio Mc [[id number]]    Return precautions given.   Risks, benefits, and alternatives of the medications and treatment plan prescribed today were discussed, and patient expressed understanding.    Education regarding symptom management and diagnosis given to patient on AVS.  Continue to follow with Burnard Hawthorne, FNP for routine health maintenance.   Georgetta Haber and I agreed with plan.   Mable Paris, FNP

## 2020-04-10 ENCOUNTER — Other Ambulatory Visit: Payer: Self-pay | Admitting: Family

## 2020-04-14 ENCOUNTER — Telehealth: Payer: Self-pay | Admitting: Family

## 2020-04-14 MED ORDER — METFORMIN HCL 500 MG PO TABS: 500.0000 mg | ORAL_TABLET | Freq: Two times a day (BID) | ORAL | 2 refills | Status: DC

## 2020-04-14 NOTE — Telephone Encounter (Signed)
Patient is requesting a refill on her metFORMIN (GLUCOPHAGE) 500 MG tablet. Her pharmacy did not fill it.

## 2020-04-16 ENCOUNTER — Other Ambulatory Visit: Payer: Self-pay

## 2020-04-16 ENCOUNTER — Other Ambulatory Visit (INDEPENDENT_AMBULATORY_CARE_PROVIDER_SITE_OTHER): Payer: Medicare Other

## 2020-04-16 DIAGNOSIS — E1122 Type 2 diabetes mellitus with diabetic chronic kidney disease: Secondary | ICD-10-CM

## 2020-04-16 DIAGNOSIS — N183 Chronic kidney disease, stage 3 unspecified: Secondary | ICD-10-CM | POA: Diagnosis not present

## 2020-04-16 DIAGNOSIS — E785 Hyperlipidemia, unspecified: Secondary | ICD-10-CM | POA: Diagnosis not present

## 2020-04-16 DIAGNOSIS — E1169 Type 2 diabetes mellitus with other specified complication: Secondary | ICD-10-CM | POA: Diagnosis not present

## 2020-04-16 DIAGNOSIS — I10 Essential (primary) hypertension: Secondary | ICD-10-CM | POA: Diagnosis not present

## 2020-04-16 LAB — COMPREHENSIVE METABOLIC PANEL
ALT: 11 U/L (ref 0–35)
AST: 15 U/L (ref 0–37)
Albumin: 3.9 g/dL (ref 3.5–5.2)
Alkaline Phosphatase: 70 U/L (ref 39–117)
BUN: 20 mg/dL (ref 6–23)
CO2: 29 mEq/L (ref 19–32)
Calcium: 9.5 mg/dL (ref 8.4–10.5)
Chloride: 102 mEq/L (ref 96–112)
Creatinine, Ser: 1.03 mg/dL (ref 0.40–1.20)
GFR: 52.28 mL/min — ABNORMAL LOW (ref >60.00)
Glucose, Bld: 107 mg/dL — ABNORMAL HIGH (ref 70–99)
Potassium: 4.6 mEq/L (ref 3.5–5.1)
Sodium: 137 mEq/L (ref 135–145)
Total Bilirubin: 0.5 mg/dL (ref 0.2–1.2)
Total Protein: 6.9 g/dL (ref 6.0–8.3)

## 2020-04-16 LAB — LIPID PANEL
Cholesterol: 143 mg/dL (ref 0–200)
HDL: 49.4 mg/dL (ref >39.00)
LDL Cholesterol: 72 mg/dL (ref 0–99)
NonHDL: 93.92
Total CHOL/HDL Ratio: 3
Triglycerides: 108 mg/dL (ref 0.0–149.0)
VLDL: 21.6 mg/dL (ref 0.0–40.0)

## 2020-04-16 LAB — HEMOGLOBIN A1C: Hgb A1c MFr Bld: 6.2 % (ref 4.6–6.5)

## 2020-04-16 LAB — ALPHA-THALASSEMIA GENOTYPR

## 2020-05-25 ENCOUNTER — Other Ambulatory Visit: Payer: Self-pay | Admitting: Family

## 2020-06-04 ENCOUNTER — Other Ambulatory Visit: Payer: Self-pay | Admitting: Oncology

## 2020-07-09 ENCOUNTER — Other Ambulatory Visit: Payer: Self-pay | Admitting: Family

## 2020-07-10 ENCOUNTER — Ambulatory Visit: Payer: Medicare Other | Admitting: Family

## 2020-07-13 ENCOUNTER — Ambulatory Visit (INDEPENDENT_AMBULATORY_CARE_PROVIDER_SITE_OTHER): Payer: Medicare Other | Admitting: Family

## 2020-07-13 ENCOUNTER — Telehealth: Payer: Self-pay | Admitting: Family

## 2020-07-13 ENCOUNTER — Other Ambulatory Visit: Payer: Self-pay

## 2020-07-13 ENCOUNTER — Ambulatory Visit: Payer: Medicare Other | Admitting: Family

## 2020-07-13 VITALS — BP 140/88 | HR 82 | Temp 97.8°F | Ht 61.0 in | Wt 151.4 lb

## 2020-07-13 DIAGNOSIS — I10 Essential (primary) hypertension: Secondary | ICD-10-CM | POA: Diagnosis not present

## 2020-07-13 DIAGNOSIS — E1169 Type 2 diabetes mellitus with other specified complication: Secondary | ICD-10-CM | POA: Diagnosis not present

## 2020-07-13 DIAGNOSIS — E785 Hyperlipidemia, unspecified: Secondary | ICD-10-CM

## 2020-07-13 MED ORDER — HYDROCHLOROTHIAZIDE 12.5 MG PO CAPS: 12.5000 mg | ORAL_CAPSULE | Freq: Every day | ORAL | 1 refills | Status: DC

## 2020-07-13 NOTE — Telephone Encounter (Signed)
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

## 2020-07-13 NOTE — Telephone Encounter (Signed)
Okay tpo schedule patient for Tdap Nurse visit last OV 3/22?

## 2020-07-13 NOTE — Patient Instructions (Signed)
Continue losartan 100mg  Start hctz 12.5mg   It is imperative that you are seen AT least twice per year for labs and monitoring. Monitor blood pressure at home and me 5-6 reading on separate days. Goal is less than 120/80, based on newest guidelines, however we certainly want to be less than 130/80;  if persistently higher, please make sooner follow up appointment so we can recheck you blood pressure and manage/ adjust medications.   Please get Tdap ( tetanus ) vaccine  Labs at [facility]

## 2020-07-13 NOTE — Assessment & Plan Note (Addendum)
Controlled ,  FGB < 120. Continue metformin 500mg  bid

## 2020-07-13 NOTE — Progress Notes (Signed)
Subjective:    Patient ID: Tracy Lawrence, female    DOB: [birth year], 80 y.o.   MRN: [id number]  CC: Tracy Lawrence is a 80 y.o. female who presents today for follow up.   HPI: Feels well today Accompanied by husband.   HTN- compliant with losartan 100mg . She is no longer on HCTZ 25mg .   CKD, anemia of chronic disease- following with Tracy Lawrence  DM- compliant with metformin 500mg  BID. FGB 118, 98   Right breast cancer - compliant with arimidex as prescribed by Tracy Lawrence     HISTORY:  Past Medical History:  Diagnosis Date   Anemia    Arthritis    Breast cancer ([place]) 2002   right breast   Breast cancer ([place]) 2011   left breast   Cancer ([place])    Family history of breast cancer    Family history of lung cancer    Family history of non-Hodgkin's lymphoma    Family history of prostate cancer    Hypertension    Malignant neoplasm of right breast [facility])    Personal history of radiation therapy 2002, 2011   right and left breast   Pre-diabetes    Past Surgical History:  Procedure Laterality Date   ABDOMINAL HYSTERECTOMY  2018   BREAST BIOPSY Right 10/04/2018   u/s bx, vision marker, path pending   BREAST LUMPECTOMY Right 2002   breast ca with rad   BREAST LUMPECTOMY Left 2011   breast ca with rad   BREAST SURGERY  2011,2002   CATARACT EXTRACTION  2015   COLONOSCOPY     COLONOSCOPY WITH PROPOFOL N/A 06/14/2019   Procedure: COLONOSCOPY WITH PROPOFOL;  Surgeon: Tracy Landsman, MD;  Location: [place];  Service: Gastroenterology;  Laterality: N/A;   ESOPHAGOGASTRODUODENOSCOPY (EGD) WITH PROPOFOL N/A 06/14/2019   Procedure: ESOPHAGOGASTRODUODENOSCOPY (EGD) WITH PROPOFOL;  Surgeon: Tracy Landsman, MD;  Location: [facility] ENDOSCOPY;  Service: Gastroenterology;  Laterality: N/A;   SIMPLE MASTECTOMY WITH AXILLARY SENTINEL NODE BIOPSY Right 01/06/2020   Procedure: SIMPLE MASTECTOMY WITH AXILLARY SENTINEL NODE BIOPSY;  Surgeon: Tracy Bellow, MD;  Location: [facility]  ORS;  Service: General;  Laterality: Right;   Family History  Problem Relation Age of Onset   Heart attack Mother    Cancer Father    Lung cancer Father    Breast cancer Sister 54       dx again 52   Heart attack Sister    Non-Hodgkin's lymphoma Sister 74   Heart attack Sister    Dementia Sister 63   Breast cancer Paternal Aunt    Breast cancer Maternal Aunt    Prostate cancer Paternal Grandfather        metastic, d. 28s   Colon cancer Neg Hx     Allergies: Patient has no known allergies. Current Outpatient Medications on File Prior to Visit  Medication Sig Dispense Refill   acetaminophen (TYLENOL) 500 MG tablet Take 1,000 mg by mouth every 6 (six) hours as needed for moderate pain or headache.     allopurinol (ZYLOPRIM) 100 MG tablet TAKE 1 TABLET(100 MG) BY MOUTH DAILY 30 tablet 3   allopurinol (ZYLOPRIM) 100 MG tablet TAKE 1 TABLET(100 MG) BY MOUTH DAILY 30 tablet 3   anastrozole (ARIMIDEX) 1 MG tablet TAKE 1 TABLET(1 MG) BY MOUTH DAILY 30 tablet 3   Calcium Carb-Cholecalciferol (CALCIUM 600 + D PO) Take 1 tablet by mouth daily.     calcium carbonate (TUMS - DOSED IN MG ELEMENTAL  CALCIUM) 500 MG chewable tablet Chew 1,000 mg by mouth daily as needed for indigestion or heartburn.     Carboxymethylcellul-Glycerin (LUBRICATING EYE DROPS OP) Place 1 drop into both eyes daily as needed (irritated eyes).     escitalopram (LEXAPRO) 10 MG tablet TAKE 1 TABLET BY MOUTH EVERY DAY FOR DEPRESSION 90 tablet 0   ferrous sulfate 325 (65 FE) MG tablet Take 325 mg by mouth daily with breakfast.     losartan (COZAAR) 100 MG tablet Take 1 tablet (100 mg total) by mouth daily. 90 tablet 3   metFORMIN (GLUCOPHAGE) 500 MG tablet Take 1 tablet (500 mg total) by mouth 2 (two) times daily with a meal. 180 tablet 2   Multiple Vitamins-Minerals (EYE VITAMINS PO) Take 1 tablet by mouth daily.      rosuvastatin (CRESTOR) 40 MG tablet TAKE 1 TABLET BY MOUTH EVERY DAY FOR CHOLESTEROL 90 tablet 1   No  current facility-administered medications on file prior to visit.    Social History   Tobacco Use   Smoking status: Former    Packs/day: 1.00    Years: 12.00    Pack years: 12.00    Types: Cigarettes    Quit date: 03/18/1979    Years since quitting: 41.3   Smokeless tobacco: Never   Tobacco comments:    quit 40 years ago  Vaping Use   Vaping Use: Never used  Substance Use Topics   Alcohol use: Yes    Comment: rarely   Drug use: Never    Review of Systems  Constitutional:  Negative for chills and fever.  Respiratory:  Negative for cough.   Cardiovascular:  Negative for chest pain and palpitations.  Gastrointestinal:  Negative for nausea and vomiting.  Neurological:  Negative for numbness.     Objective:    BP 140/88   Pulse 82   Temp 97.8 F (36.6 C) (Oral)   Ht 5\' 1"  (1.549 m)   Wt 151 lb 6.4 oz (68.7 kg)   LMP  (LMP Unknown)   SpO2 98%   BMI 28.61 kg/m  BP Readings from Last 3 Encounters:  07/13/20 140/88  04/08/20 (!) 142/68  04/06/20 (!) 141/82   Wt Readings from Last 3 Encounters:  07/13/20 151 lb 6.4 oz (68.7 kg)  04/08/20 158 lb 8 oz (71.9 kg)  04/06/20 157 lb 14.4 oz (71.6 kg)     Physical Exam Vitals reviewed.  Constitutional:      Appearance: She is well-developed.  Eyes:     Conjunctiva/sclera: Conjunctivae normal.  Cardiovascular:     Rate and Rhythm: Normal rate and regular rhythm.     Pulses: Normal pulses.     Heart sounds: Normal heart sounds.  Pulmonary:     Effort: Pulmonary effort is normal.     Breath sounds: Normal breath sounds. No wheezing, rhonchi or rales.  Skin:    General: Skin is warm and dry.  Neurological:     Mental Status: She is alert.  Psychiatric:        Speech: Speech normal.        Behavior: Behavior normal.        Thought Content: Thought content normal.       Assessment & Plan:   Problem List Items Addressed This Visit       Cardiovascular and Mediastinum   Essential hypertension - Primary     Slightly elevated. For some reason off of HCTZ. Start hctz 12.5mg , labs in 3 days when she does  labs with with Tracy Lawrence. Continue losartan 100mg        Relevant Medications   hydrochlorothiazide (MICROZIDE) 12.5 MG capsule   Other Relevant Orders   Hepatitis C antibody   Comprehensive metabolic panel     Endocrine   Hyperlipidemia associated with type 2 diabetes mellitus (HCC)    Controlled ,  FGB < 120. Continue metformin 500mg  bid         I am having Bjorn Loser. Beltran start on hydrochlorothiazide. I am also having her maintain her Multiple Vitamins-Minerals (EYE VITAMINS PO), Calcium Carb-Cholecalciferol (CALCIUM 600 + D PO), acetaminophen, calcium carbonate, Carboxymethylcellul-Glycerin (LUBRICATING EYE DROPS OP), rosuvastatin, ferrous sulfate, losartan, allopurinol, metFORMIN, escitalopram, anastrozole, and allopurinol.   Meds ordered this encounter  Medications   hydrochlorothiazide (MICROZIDE) 12.5 MG capsule    Sig: Take 1 capsule (12.5 mg total) by mouth daily.    Dispense:  90 capsule    Refill:  1    Order Specific Question:   Supervising Provider    Answer:   Crecencio Mc [[id number]]     Return precautions given.   Risks, benefits, and alternatives of the medications and treatment plan prescribed today were discussed, and patient expressed understanding.   Education regarding symptom management and diagnosis given to patient on AVS.  Continue to follow with Burnard Hawthorne, FNP for routine health maintenance.   Georgetta Haber and I agreed with plan.   Mable Paris, FNP

## 2020-07-13 NOTE — Telephone Encounter (Signed)
Yes may sch tdap

## 2020-07-13 NOTE — Assessment & Plan Note (Signed)
Slightly elevated. For some reason off of HCTZ. Start hctz 12.5mg , labs in 3 days when she does labs with with Dr Tasia Catchings. Continue losartan 100mg 

## 2020-07-14 NOTE — Telephone Encounter (Signed)
Patient actually received here yesterday.

## 2020-07-16 ENCOUNTER — Other Ambulatory Visit: Payer: Medicare Other

## 2020-07-16 ENCOUNTER — Ambulatory Visit: Payer: Medicare Other | Admitting: Oncology

## 2020-07-24 ENCOUNTER — Inpatient Hospital Stay: Payer: Medicare Other | Admitting: Oncology

## 2020-07-24 ENCOUNTER — Inpatient Hospital Stay: Payer: Medicare Other

## 2020-07-29 ENCOUNTER — Encounter: Payer: Self-pay | Admitting: Oncology

## 2020-07-29 ENCOUNTER — Inpatient Hospital Stay: Payer: Medicare Other | Attending: Oncology

## 2020-07-29 ENCOUNTER — Inpatient Hospital Stay (HOSPITAL_BASED_OUTPATIENT_CLINIC_OR_DEPARTMENT_OTHER): Payer: Medicare Other | Admitting: Oncology

## 2020-07-29 VITALS — BP 135/73 | HR 73 | Temp 97.2°F | Resp 18 | Wt 148.5 lb

## 2020-07-29 DIAGNOSIS — D509 Iron deficiency anemia, unspecified: Secondary | ICD-10-CM | POA: Insufficient documentation

## 2020-07-29 DIAGNOSIS — Z853 Personal history of malignant neoplasm of breast: Secondary | ICD-10-CM

## 2020-07-29 DIAGNOSIS — Z7984 Long term (current) use of oral hypoglycemic drugs: Secondary | ICD-10-CM | POA: Insufficient documentation

## 2020-07-29 DIAGNOSIS — Z170 Estrogen receptor positive status [ER+]: Secondary | ICD-10-CM | POA: Insufficient documentation

## 2020-07-29 DIAGNOSIS — Z79811 Long term (current) use of aromatase inhibitors: Secondary | ICD-10-CM | POA: Diagnosis not present

## 2020-07-29 DIAGNOSIS — C50911 Malignant neoplasm of unspecified site of right female breast: Secondary | ICD-10-CM

## 2020-07-29 DIAGNOSIS — Z1239 Encounter for other screening for malignant neoplasm of breast: Secondary | ICD-10-CM | POA: Diagnosis not present

## 2020-07-29 DIAGNOSIS — I10 Essential (primary) hypertension: Secondary | ICD-10-CM | POA: Insufficient documentation

## 2020-07-29 DIAGNOSIS — Z1231 Encounter for screening mammogram for malignant neoplasm of breast: Secondary | ICD-10-CM

## 2020-07-29 DIAGNOSIS — Z79899 Other long term (current) drug therapy: Secondary | ICD-10-CM | POA: Insufficient documentation

## 2020-07-29 LAB — COMPREHENSIVE METABOLIC PANEL
ALT: 14 U/L (ref 0–44)
AST: 18 U/L (ref 15–41)
Albumin: 3.8 g/dL (ref 3.5–5.0)
Alkaline Phosphatase: 64 U/L (ref 38–126)
Anion gap: 6 (ref 5–15)
BUN: 27 mg/dL — ABNORMAL HIGH (ref 8–23)
CO2: 27 mmol/L (ref 22–32)
Calcium: 9.7 mg/dL (ref 8.9–10.3)
Chloride: 104 mmol/L (ref 98–111)
Creatinine, Ser: 1.33 mg/dL — ABNORMAL HIGH (ref 0.44–1.00)
GFR, Estimated: 41 mL/min — ABNORMAL LOW (ref >60)
Glucose, Bld: 100 mg/dL — ABNORMAL HIGH (ref 70–99)
Potassium: 4.3 mmol/L (ref 3.5–5.1)
Sodium: 137 mmol/L (ref 135–145)
Total Bilirubin: 0.9 mg/dL (ref 0.3–1.2)
Total Protein: 6.8 g/dL (ref 6.5–8.1)

## 2020-07-29 LAB — CBC WITH DIFFERENTIAL/PLATELET
Abs Immature Granulocytes: 0.01 10*3/uL (ref 0.00–0.07)
Basophils Absolute: 0 10*3/uL (ref 0.0–0.1)
Basophils Relative: 0 %
Eosinophils Absolute: 0.3 10*3/uL (ref 0.0–0.5)
Eosinophils Relative: 6 %
HCT: 33.5 % — ABNORMAL LOW (ref 36.0–46.0)
Hemoglobin: 10.6 g/dL — ABNORMAL LOW (ref 12.0–15.0)
Immature Granulocytes: 0 %
Lymphocytes Relative: 38 %
Lymphs Abs: 1.7 10*3/uL (ref 0.7–4.0)
MCH: 26 pg (ref 26.0–34.0)
MCHC: 31.6 g/dL (ref 30.0–36.0)
MCV: 82.3 fL (ref 80.0–100.0)
Monocytes Absolute: 0.4 10*3/uL (ref 0.1–1.0)
Monocytes Relative: 8 %
Neutro Abs: 2.2 10*3/uL (ref 1.7–7.7)
Neutrophils Relative %: 48 %
Platelets: 165 10*3/uL (ref 150–400)
RBC: 4.07 MIL/uL (ref 3.87–5.11)
RDW: 14.6 % (ref 11.5–15.5)
WBC: 4.6 10*3/uL (ref 4.0–10.5)
nRBC: 0 % (ref 0.0–0.2)

## 2020-07-29 MED ORDER — ONDANSETRON HCL 4 MG PO TABS: 4.0000 mg | ORAL_TABLET | Freq: Three times a day (TID) | ORAL | 0 refills | Status: DC | PRN

## 2020-07-29 NOTE — Progress Notes (Signed)
Patient here for follow up. Pt reports occasional vomiting and "touchy" stomach. No new breast problems.

## 2020-07-29 NOTE — Progress Notes (Signed)
Hematology/Oncology  progress note [facility] [phone] [phone]   Patient Care Team: Burnard Hawthorne, FNP as PCP - General (Family Medicine) Theodore Demark, RN as Oncology Nurse Navigator (Oncology) Noreene Filbert, MD as Radiation Oncologist (Radiation Oncology)  REFERRING PROVIDER: Burnard Hawthorne, FNP  CHIEF COMPLAINTS/REASON FOR VISIT:  Follow up  for breast cancer  PERTINENT ONCOLOGY HISTORY Tracy Lawrence is a  80 y.o.  female with PMH listed below was seen in consultation at the request of  Burnard Hawthorne, FNP  for evaluation of abnormal SPEP. Reviewed patient's previous blood work via care everywhere. 08/27/2018, free light chain ratio 1.97, free kappa 79.5, free lambda 40.3. Protein electrophoresis showed increased alpha globulin.  #Patient moved from [place] to [place] last year. She reports history of right breast cancer in 2002 and left breast cancer in 2011.  She underwent right breast lumpectomy followed by adjuvant radiation followed by 5 years of tamoxifen.  Underwent left lumpectomy followed by adjuvant radiation followed by aromatase inhibitor for 5 years.  Currently she is not on any antiestrogen treatment.  Denies any chemotherapy treatment history.  Both breast cancer treatments were done in [place]. Obtained records from [facility] clinic breast surgery [place] [facility] Records were reviewed.  No pathology is available. 06/06/2017 screening mammogram showed postoperative changes in both breasts.  Architectural distortion has not changed significantly.  Skin thickening is similar.  Calcifications have increased mildly around the postoperative sites bilaterally.  No new dominant mass.   # Chronic history of kidney insufficiency, stage IIIb.  Patient sees Dr. Holley Raring.  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  # Breast exam was performed in seated and lying  down position. Right breast lower inner quadrant lumpectomy scar with focal scar tissue. Left breast upper outer quadrant, 1:00 lumpectomy scar with palpable round thickened breast tissue. No palpable axillary lymphadenopathy.  # #Family history of breast cancer in first-degree relatives, patient reports that she was previously tested negative for BRCA gene mutation.  Results were not available. Patient was referred to genetic counselor and 11/19/2019 [facility] Breast Cancer STAT Panel + Multi- cancer panel found no pathogenic mutations.   # 09/17/2019 bilateral screening mammogram showed right breast mass which warrants further evaluation.  No suspicious findings in left breast. 09/25/2019, right diagnostic breast mammogram showed specialist right breast mass at 10:00. Right axilla is negative for adenopathy.  Patient underwent ultrasound-guided core biopsy of the right breast 10:00 location. 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  Grade 2, no DCIS or LVI.  ER 90%, PR 11-50%, HER-2 negative.  Chronic microcytic anemia, iron panel is not consistent with iron deficiency. Normal hemoglobin evaluation.  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 Hemoglobin stable at 11.6. GI work-up includes EGD and colonoscopy on 06/14/2019.  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  Benign colon polyps.  No high-grade dysplasia or malignancy.  She follows up with Dr. Marius Ditch Anemia can be also secondary to CKD.  # #01/06/2020 patient underwent right mastectomy and sentinel lymph node biopsy.  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  No adjuvant chemotherapy will be offered She had mastectomy with negative margin, no need for adjuvant radiation. Baseline elevated CA 27.29--> post operation  normalization of  CEA 2729  January 2022, started on Arimidex She was seen by Dr. Bary Castilla 04/02/2020 for seroma.  #INTERVAL HISTORY Tracy Lawrence is a 80 y.o. female who has above history reviewed by me today presents for follow up visit for breast cancer.  Patient has been on Arimidex since January 2022.  Overall she tolerates well.  She experiences manageable hot flash.  Intermittently, about once every 3 to 4 weeks, she feels nausea and sometimes threw up.   Review of Systems  Constitutional:  Negative for appetite change, chills, fatigue and fever.  HENT:   Negative for hearing loss and voice change.   Eyes:  Negative for eye problems.  Respiratory:  Negative for chest tightness and cough.   Cardiovascular:  Negative for chest pain.  Gastrointestinal:  Negative for abdominal distention, abdominal pain and blood in stool.  Endocrine: Negative for hot flashes.  Genitourinary:  Negative for difficulty urinating and frequency.   Musculoskeletal:  Negative for arthralgias.  Skin:  Negative for itching and rash.  Neurological:  Negative for extremity weakness.  Hematological:  Negative for adenopathy.  Psychiatric/Behavioral:  Negative for confusion.   Right mastectomy MEDICAL HISTORY:  Past Medical History:  Diagnosis Date   Anemia    Arthritis    Breast cancer ([place]) 2002   right breast   Breast cancer ([place]) 2011   left breast   Cancer ([place])    Family history of breast cancer    Family history of lung cancer    Family history of non-Hodgkin's lymphoma    Family history of prostate cancer    Hypertension    Malignant neoplasm of right breast [facility])    Personal history of radiation therapy 2002, 2011   right and left breast   Pre-diabetes     SURGICAL HISTORY: Past Surgical History:  Procedure Laterality Date   ABDOMINAL HYSTERECTOMY  2018   BREAST BIOPSY Right 10/04/2018   u/s bx, vision marker, path pending   BREAST LUMPECTOMY Right 2002   breast ca with rad    BREAST LUMPECTOMY Left 2011   breast ca with rad   BREAST SURGERY  2011,2002   CATARACT EXTRACTION  2015   COLONOSCOPY     COLONOSCOPY WITH PROPOFOL N/A 06/14/2019   Procedure: COLONOSCOPY WITH PROPOFOL;  Surgeon: Lin Landsman, MD;  Location: [facility] ENDOSCOPY;  Service: Gastroenterology;  Laterality: N/A;   ESOPHAGOGASTRODUODENOSCOPY (EGD) WITH PROPOFOL N/A 06/14/2019   Procedure: ESOPHAGOGASTRODUODENOSCOPY (EGD) WITH PROPOFOL;  Surgeon: Lin Landsman, MD;  Location: [facility] ENDOSCOPY;  Service: Gastroenterology;  Laterality: N/A;   SIMPLE MASTECTOMY WITH AXILLARY SENTINEL NODE BIOPSY Right 01/06/2020   Procedure: SIMPLE MASTECTOMY WITH AXILLARY SENTINEL NODE BIOPSY;  Surgeon: Robert Bellow, MD;  Location: [facility] ORS;  Service: General;  Laterality: Right;    SOCIAL HISTORY: Social History   Socioeconomic History   Marital status: Married    Spouse name: Not on file   Number of children: Not on file   Years of education: Not on file   Highest education level: Not on file  Occupational History   Not on file  Tobacco Use   Smoking status: Former    Packs/day: 1.00    Years: 12.00    Pack years: 12.00    Types: Cigarettes    Quit date: 03/18/1979    Years since quitting: 41.3   Smokeless tobacco: Never   Tobacco comments:    quit 40 years ago  Vaping Use   Vaping Use: Never used  Substance  and Sexual Activity   Alcohol use: Yes    Comment: rarely   Drug use: Never   Sexual activity: Not on file  Other Topics Concern   Not on file  Social History Narrative   From [place]      Retired      Social Determinants of Radio broadcast assistant Strain: Low Risk    Difficulty of Paying Living Expenses: Not hard at all  Food Insecurity: No Food Insecurity   Worried About Charity fundraiser in the Last Year: Never true   Arboriculturist in the Last Year: Never true  Transportation Needs: No Transportation Needs   Lack of Transportation (Medical): No   Lack of  Transportation (Non-Medical): No  Physical Activity: Not on file  Stress: No Stress Concern Present   Feeling of Stress : Not at all  Social Connections: Unknown   Frequency of Communication with Friends and Family: Not on file   Frequency of Social Gatherings with Friends and Family: Not on file   Attends Religious Services: Not on Electrical engineer or Organizations: Not on file   Attends Archivist Meetings: Not on file   Marital Status: Married  Human resources officer Violence: Not At Risk   Fear of Current or Ex-Partner: No   Emotionally Abused: No   Physically Abused: No   Sexually Abused: No    FAMILY HISTORY: Family History  Problem Relation Age of Onset   Heart attack Mother    Cancer Father    Lung cancer Father    Breast cancer Sister 77       dx again 61   Heart attack Sister    Non-Hodgkin's lymphoma Sister 85   Heart attack Sister    Dementia Sister 42   Breast cancer Paternal Aunt    Breast cancer Maternal Aunt    Prostate cancer Paternal Grandfather        metastic, d. 17s   Colon cancer Neg Hx     ALLERGIES:  has No Known Allergies.  MEDICATIONS:  Current Outpatient Medications  Medication Sig Dispense Refill   acetaminophen (TYLENOL) 500 MG tablet Take 1,000 mg by mouth every 6 (six) hours as needed for moderate pain or headache.     allopurinol (ZYLOPRIM) 100 MG tablet TAKE 1 TABLET(100 MG) BY MOUTH DAILY 30 tablet 3   anastrozole (ARIMIDEX) 1 MG tablet TAKE 1 TABLET(1 MG) BY MOUTH DAILY 30 tablet 3   Calcium Carb-Cholecalciferol (CALCIUM 600 + D PO) Take 1 tablet by mouth daily.     calcium carbonate (TUMS - DOSED IN MG ELEMENTAL CALCIUM) 500 MG chewable tablet Chew 1,000 mg by mouth daily as needed for indigestion or heartburn.     Carboxymethylcellul-Glycerin (LUBRICATING EYE DROPS OP) Place 1 drop into both eyes daily as needed (irritated eyes).     escitalopram (LEXAPRO) 10 MG tablet TAKE 1 TABLET BY MOUTH EVERY DAY FOR DEPRESSION  90 tablet 0   ferrous sulfate 325 (65 FE) MG tablet Take 325 mg by mouth daily with breakfast.     hydrochlorothiazide (MICROZIDE) 12.5 MG capsule Take 1 capsule (12.5 mg total) by mouth daily. 90 capsule 1   losartan (COZAAR) 100 MG tablet Take 1 tablet (100 mg total) by mouth daily. 90 tablet 3   metFORMIN (GLUCOPHAGE) 500 MG tablet Take 1 tablet (500 mg total) by mouth 2 (two) times daily with a meal. 180 tablet 2   Multiple Vitamins-Minerals (EYE  VITAMINS PO) Take 1 tablet by mouth daily.      ondansetron (ZOFRAN) 4 MG tablet Take 1 tablet (4 mg total) by mouth every 8 (eight) hours as needed for nausea or vomiting. 30 tablet 0   rosuvastatin (CRESTOR) 40 MG tablet TAKE 1 TABLET BY MOUTH EVERY DAY FOR CHOLESTEROL 90 tablet 1   No current facility-administered medications for this visit.     PHYSICAL EXAMINATION: ECOG PERFORMANCE STATUS: 0 - Asymptomatic Vitals:   07/29/20 1007  BP: 135/73  Pulse: 73  Resp: 18  Temp: (!) 97.2 F (36.2 C)   Filed Weights   07/29/20 1007  Weight: 148 lb 8 oz (67.4 kg)    Physical Exam Constitutional:      General: She is not in acute distress. HENT:     Head: Normocephalic and atraumatic.  Eyes:     General: No scleral icterus. Cardiovascular:     Rate and Rhythm: Normal rate and regular rhythm.     Heart sounds: Normal heart sounds.  Pulmonary:     Effort: Pulmonary effort is normal. No respiratory distress.     Breath sounds: No wheezing.  Abdominal:     General: Bowel sounds are normal. There is no distension.     Palpations: Abdomen is soft.  Musculoskeletal:        General: No deformity. Normal range of motion.     Cervical back: Normal range of motion and neck supple.  Skin:    General: Skin is warm and dry.     Findings: No erythema or rash.  Neurological:     Mental Status: She is alert and oriented to person, place, and time. Mental status is at baseline.     Cranial Nerves: No cranial nerve deficit.     Coordination:  Coordination normal.  Psychiatric:        Mood and Affect: Mood normal.  Breast exam was performed in seated and lying down position. Right breast mastectomy Left breast upper outer quadrant, 1:00 lumpectomy scar with palpable round thickened breast tissue.  LABORATORY DATA:  I have reviewed the data as listed Lab Results  Component Value Date   WBC 4.6 07/29/2020   HGB 10.6 (L) 07/29/2020   HCT 33.5 (L) 07/29/2020   MCV 82.3 07/29/2020   PLT 165 07/29/2020   Recent Labs    10/02/19 1324 10/14/19 1445 04/06/20 1411 04/16/20 1041 07/29/20 0941  NA 136 136 135 137 137  K 4.8 4.4 4.4 4.6 4.3  CL 100 101 99 102 104  CO2 _0 GLUCOSE 133* 122* 116* 107* 100*  BUN 24* _1 27*  CREATININE 1.19* 0.99 1.04* 1.03 1.33*  CALCIUM 9.1 9.5 9.5 9.5 9.7  GFRNONAA 44* 55* 55*  --  41*  GFRAA 51* >60  --   --   --   PROT 7.3 7.9 7.6 6.9 6.8  ALBUMIN 3.9 4.1 4.0 3.9 3.8  AST _2 ALT _3 ALKPHOS 76 73 65 70 64  BILITOT 0.3 0.5 0.6 0.5 0.9    Iron/TIBC/Ferritin/ %Sat    Component Value Date/Time   IRON 45 10/02/2019 1324   TIBC 283 10/02/2019 1324   FERRITIN 106 10/02/2019 1324   IRONPCTSAT 16 10/02/2019 1324      RADIOGRAPHIC STUDIES: I have personally reviewed the radiological images as listed and agreed with the findings in the report. No results found.     ASSESSMENT &  PLAN:  1. Malignant neoplasm of right breast in female, estrogen receptor positive, unspecified site of breast ([place])   2. Aromatase inhibitor use   3. History of breast cancer   4. Encounter for screening for malignant neoplasm of breast, unspecified screening modality   5. Screening mammogram, encounter for    #Right breast invasive carcinoma, grade 2 ER 90%/PR 11-50%/HER-2 negative pT1b pN0 status post right mastectomy. Clinically doing well Labs are reviewed and discussed with patient .  Baseline elevated CA 27-29 has normalized. Recommend patient to  continue Arimidex 1 mg daily Obtain bone density in August 2022 Will obtain left screening mammogram in August 2022 Recommend patient to continue calcium and vitamin D supplementation.  #Chronic microcytic anemia, she has alpha thalassemia trait #Intermittent nausea episodes, recommend patient to try Zofran 4 mg every 8 hours as needed for nausea.  Orders Placed This Encounter  Procedures   DG Bone Density    Standing Status:   Future    Standing Expiration Date:   07/29/2021    Order Specific Question:   Reason for Exam (SYMPTOM  OR DIAGNOSIS REQUIRED)    Answer:   history of breast cancer, on aromatase inhibitor    Order Specific Question:   Preferred imaging location?    Answer:   [facility]   MM 3D SCREEN BREAST UNI LEFT    Standing Status:   Future    Standing Expiration Date:   07/29/2021    Order Specific Question:   Reason for Exam (SYMPTOM  OR DIAGNOSIS REQUIRED)    Answer:   history of breast cancer,    Order Specific Question:   Preferred imaging location?    Answer:   [facility]    All questions were answered. The patient knows to call the clinic with any problems questions or concerns.   Return of visit:  3 months.   Earlie Server, MD, PhD Hematology Oncology [facility] Pager- [phone] 07/29/2020

## 2020-08-04 ENCOUNTER — Other Ambulatory Visit: Payer: Self-pay | Admitting: Family

## 2020-08-24 ENCOUNTER — Other Ambulatory Visit: Payer: Self-pay | Admitting: Family

## 2020-09-17 ENCOUNTER — Other Ambulatory Visit: Payer: Medicare Other

## 2020-10-07 ENCOUNTER — Other Ambulatory Visit: Payer: Self-pay | Admitting: Family

## 2020-10-14 ENCOUNTER — Other Ambulatory Visit: Payer: Self-pay

## 2020-10-14 ENCOUNTER — Ambulatory Visit
Admission: RE | Admit: 2020-10-14 | Discharge: 2020-10-14 | Disposition: A | Discharge status: Home / Self Care | Payer: Medicare Other | Source: Ambulatory Visit | Attending: Oncology | Admitting: Oncology

## 2020-10-14 DIAGNOSIS — E119 Type 2 diabetes mellitus without complications: Secondary | ICD-10-CM | POA: Diagnosis not present

## 2020-10-14 DIAGNOSIS — Z780 Asymptomatic menopausal state: Secondary | ICD-10-CM | POA: Insufficient documentation

## 2020-10-14 DIAGNOSIS — Z853 Personal history of malignant neoplasm of breast: Secondary | ICD-10-CM | POA: Insufficient documentation

## 2020-10-14 DIAGNOSIS — C50911 Malignant neoplasm of unspecified site of right female breast: Secondary | ICD-10-CM

## 2020-10-14 DIAGNOSIS — Z1231 Encounter for screening mammogram for malignant neoplasm of breast: Secondary | ICD-10-CM | POA: Diagnosis not present

## 2020-10-14 DIAGNOSIS — Z79811 Long term (current) use of aromatase inhibitors: Secondary | ICD-10-CM

## 2020-10-14 DIAGNOSIS — Z9011 Acquired absence of right breast and nipple: Secondary | ICD-10-CM | POA: Insufficient documentation

## 2020-10-14 DIAGNOSIS — Z923 Personal history of irradiation: Secondary | ICD-10-CM | POA: Diagnosis not present

## 2020-10-14 DIAGNOSIS — Z1239 Encounter for other screening for malignant neoplasm of breast: Secondary | ICD-10-CM

## 2020-10-16 ENCOUNTER — Ambulatory Visit: Payer: Medicare Other | Admitting: Family

## 2020-10-16 ENCOUNTER — Ambulatory Visit (INDEPENDENT_AMBULATORY_CARE_PROVIDER_SITE_OTHER): Payer: Medicare Other

## 2020-10-16 DIAGNOSIS — Z23 Encounter for immunization: Secondary | ICD-10-CM

## 2020-10-19 ENCOUNTER — Other Ambulatory Visit: Payer: Self-pay | Admitting: *Deleted

## 2020-10-19 MED ORDER — ANASTROZOLE 1 MG PO TABS: ORAL_TABLET | ORAL | 0 refills | Status: DC

## 2020-10-28 ENCOUNTER — Ambulatory Visit (INDEPENDENT_AMBULATORY_CARE_PROVIDER_SITE_OTHER): Payer: Medicare Other

## 2020-10-28 VITALS — Ht 61.0 in | Wt 148.0 lb

## 2020-10-28 DIAGNOSIS — Z0000 Encounter for general adult medical examination without abnormal findings: Secondary | ICD-10-CM | POA: Diagnosis not present

## 2020-10-28 NOTE — Progress Notes (Signed)
Subjective:   CLARENE CURRAN is a 80 y.o. female who presents for Medicare Annual (Subsequent) preventive examination.  Review of Systems    No ROS.  Medicare Wellness Virtual Visit.  Visual/audio telehealth visit, UTA vital signs.   See social history for additional risk factors.   Cardiac Risk Factors include: advanced age (>71men, >21 women);diabetes mellitus;hypertension     Objective:    Today's Vitals   10/28/20 1323  Weight: 148 lb (67.1 kg)  Height: 5\' 1"  (1.549 m)   Body mass index is 27.96 kg/m.  Advanced Directives 10/28/2020 07/29/2020 04/06/2020 02/03/2020 01/06/2020 12/30/2019 10/28/2019  Does Patient Have a Medical Advance Directive? Yes Yes Yes Yes Yes Yes Yes  Type of Paramedic of [place];Living will Living will Living will Living will Living will Living will -  Does patient want to make changes to medical advance directive? No - Patient declined - - - - - No - Patient declined  Copy of [place] in Chart? No - copy requested - - - - - -    Current Medications (verified) Outpatient Encounter Medications as of 10/28/2020  Medication Sig   acetaminophen (TYLENOL) 500 MG tablet Take 1,000 mg by mouth every 6 (six) hours as needed for moderate pain or headache.   allopurinol (ZYLOPRIM) 100 MG tablet TAKE 1 TABLET(100 MG) BY MOUTH DAILY   allopurinol (ZYLOPRIM) 100 MG tablet TAKE 1 TABLET(100 MG) BY MOUTH DAILY   anastrozole (ARIMIDEX) 1 MG tablet TAKE 1 TABLET(1 MG) BY MOUTH DAILY   Calcium Carb-Cholecalciferol (CALCIUM 600 + D PO) Take 1 tablet by mouth daily.   calcium carbonate (TUMS - DOSED IN MG ELEMENTAL CALCIUM) 500 MG chewable tablet Chew 1,000 mg by mouth daily as needed for indigestion or heartburn.   Carboxymethylcellul-Glycerin (LUBRICATING EYE DROPS OP) Place 1 drop into both eyes daily as needed (irritated eyes).   escitalopram (LEXAPRO) 10 MG tablet TAKE 1 TABLET BY MOUTH EVERY DAY FOR DEPRESSION   ferrous  sulfate 325 (65 FE) MG tablet Take 325 mg by mouth daily with breakfast.   hydrochlorothiazide (MICROZIDE) 12.5 MG capsule Take 1 capsule (12.5 mg total) by mouth daily.   losartan (COZAAR) 100 MG tablet Take 1 tablet (100 mg total) by mouth daily.   metFORMIN (GLUCOPHAGE) 500 MG tablet Take 1 tablet (500 mg total) by mouth 2 (two) times daily with a meal.   Multiple Vitamins-Minerals (EYE VITAMINS PO) Take 1 tablet by mouth daily.    ondansetron (ZOFRAN) 4 MG tablet Take 1 tablet (4 mg total) by mouth every 8 (eight) hours as needed for nausea or vomiting.   rosuvastatin (CRESTOR) 40 MG tablet TAKE 1 TABLET BY MOUTH EVERY DAY FOR CHOLESTEROL   No facility-administered encounter medications on file as of 10/28/2020.    Allergies (verified) Patient has no known allergies.   History: Past Medical History:  Diagnosis Date   Anemia    Arthritis    Breast cancer ([place]) 2002   right breast   Breast cancer ([place]) 2011   left breast   Cancer ([place])    Family history of breast cancer    Family history of lung cancer    Family history of non-Hodgkin's lymphoma    Family history of prostate cancer    Hypertension    Malignant neoplasm of right breast [facility])    Personal history of radiation therapy 2002, 2011   right and left breast   Pre-diabetes    Past Surgical History:  Procedure Laterality Date   ABDOMINAL HYSTERECTOMY  2018   BREAST BIOPSY Right 10/04/2018   u/s bx, vision marker, path pending   BREAST LUMPECTOMY Right 2002   breast ca with rad   BREAST LUMPECTOMY Left 2011   breast ca with rad   BREAST SURGERY  2011,2002   CATARACT EXTRACTION  2015   COLONOSCOPY     COLONOSCOPY WITH PROPOFOL N/A 06/14/2019   Procedure: COLONOSCOPY WITH PROPOFOL;  Surgeon: Lin Landsman, MD;  Location: [facility];  Service: Gastroenterology;  Laterality: N/A;   ESOPHAGOGASTRODUODENOSCOPY (EGD) WITH PROPOFOL N/A 06/14/2019   Procedure: ESOPHAGOGASTRODUODENOSCOPY (EGD) WITH PROPOFOL;   Surgeon: Lin Landsman, MD;  Location: [facility] ENDOSCOPY;  Service: Gastroenterology;  Laterality: N/A;   SIMPLE MASTECTOMY WITH AXILLARY SENTINEL NODE BIOPSY Right 01/06/2020   Procedure: SIMPLE MASTECTOMY WITH AXILLARY SENTINEL NODE BIOPSY;  Surgeon: Robert Bellow, MD;  Location: [facility] ORS;  Service: General;  Laterality: Right;   Family History  Problem Relation Age of Onset   Heart attack Mother    Cancer Father    Lung cancer Father    Breast cancer Sister 81       dx again 74   Heart attack Sister    Non-Hodgkin's lymphoma Sister 7   Heart attack Sister    Dementia Sister 43   Breast cancer Paternal Aunt    Breast cancer Maternal Aunt    Prostate cancer Paternal Grandfather        metastic, d. 28s   Colon cancer Neg Hx    Social History   Socioeconomic History   Marital status: Married    Spouse name: Not on file   Number of children: Not on file   Years of education: Not on file   Highest education level: Not on file  Occupational History   Not on file  Tobacco Use   Smoking status: Former    Packs/day: 1.00    Years: 12.00    Pack years: 12.00    Types: Cigarettes    Quit date: 03/18/1979    Years since quitting: 41.6   Smokeless tobacco: Never   Tobacco comments:    quit 40 years ago  Vaping Use   Vaping Use: Never used  Substance and Sexual Activity   Alcohol use: Yes    Comment: rarely   Drug use: Never   Sexual activity: Not on file  Other Topics Concern   Not on file  Social History Narrative   From [place]      Retired      Social Determinants of Radio broadcast assistant Strain: Low Risk    Difficulty of Paying Living Expenses: Not hard at all  Food Insecurity: No Food Insecurity   Worried About Charity fundraiser in the Last Year: Never true   Arboriculturist in the Last Year: Never true  Transportation Needs: No Transportation Needs   Lack of Transportation (Medical): No   Lack of Transportation (Non-Medical): No  Physical  Activity: Unknown   Days of Exercise per Week: 0 days   Minutes of Exercise per Session: Not on file  Stress: No Stress Concern Present   Feeling of Stress : Not at all  Social Connections: Unknown   Frequency of Communication with Friends and Family: Not on file   Frequency of Social Gatherings with Friends and Family: Not on file   Attends Religious Services: Not on file   Active Member of Clubs or Organizations: Not on  file   Attends Archivist Meetings: Not on file   Marital Status: Married    Tobacco Counseling Counseling given: Not Answered Tobacco comments: quit 40 years ago   Clinical Intake:  Pre-visit preparation completed: Yes        Diabetes: Yes (Followed by pcp)   Diabetes Management: Does the patient want to be seen by Chronic Care Management for management of their diabetes?  No  Would the patient like to be referred to a Nutritionist or for Diabetic Management?  No   Diabetic Exams: Eye Exam - scheduled for 10/2020.   Interpreter Needed?: No      Activities of Daily Living In your present state of health, do you have any difficulty performing the following activities: 10/28/2020 01/06/2020  Hearing? N -  [place]? N -  Difficulty concentrating or making decisions? Y -  Walking or climbing stairs? N -  Dressing or bathing? N -  Doing errands, shopping? N Y  Conservation officer, nature and eating ? N -  Using the Toilet? N -  In the past six months, have you accidently leaked urine? Y -  Comment Managed with daily liner -  Do you have problems with loss of bowel control? N -  Managing your Medications? N -  Managing your Finances? N -  Housekeeping or managing your Housekeeping? N -  Some recent data might be hidden    Patient Care Team: Burnard Hawthorne, FNP as PCP - General (Family Medicine) Theodore Demark, RN as Oncology Nurse Navigator (Oncology) Noreene Filbert, MD as Radiation Oncologist (Radiation Oncology)  Indicate any  recent Medical Services you may have received from other than [facility] providers in the past year (date may be approximate).     Assessment:   This is a routine wellness examination for Keymiah. I connected with Valeria today by telephone and verified that I am speaking with the correct person using two identifiers. Location patient: home Location provider: work Persons participating in the virtual visit: patient, Marine scientist.    I discussed the limitations, risks, security and privacy concerns of performing an evaluation and management service by telephone and the availability of in person appointments. The patient expressed understanding and verbally consented to this telephonic visit.    Interactive audio and video telecommunications were attempted between this provider and patient, however failed, due to patient having technical difficulties OR patient did not have access to video capability.  We continued and completed visit with audio only.  Some vital signs may be absent or patient reported.   Hearing/Vision screen Hearing Screening - Comments:: Patient is able to hear conversational tones without difficulty.  No issues reported. Vision Screening - Comments:: Wears corrective lenses  Cataract extraction, bilateral Macular degeneration   Dietary issues and exercise activities discussed: Current Exercise Habits: Home exercise routine, Intensity: Mild Healthy diet Good water intake   Goals Addressed               This Visit's Progress     Patient Stated     Increase physical activity (pt-stated)        I would like to walk more for exercise       Depression Screen PHQ 2/9 Scores 10/28/2020 07/13/2020 04/08/2020 10/28/2019 09/27/2019 06/12/2019 01/28/2019  PHQ - 2 Score 0 0 0 0 0 0 0  PHQ- 9 Score - - 2 0 1 1 1     Fall Risk Fall Risk  10/28/2020 07/13/2020 10/28/2019 10/17/2018 07/25/2018  Falls in the past year? 0 0 0 0 0  Number falls in past yr: 0 0 0 - -  Injury with Fall? - 0  - - -  Risk for fall due to : - No Fall Risks - - -  Follow up Falls evaluation completed Falls evaluation completed Falls evaluation completed - -    FALL RISK PREVENTION PERTAINING TO THE HOME: Adequate lighting in your home to reduce risk of falls? Yes   ASSISTIVE DEVICES UTILIZED TO PREVENT FALLS: Use of a cane, walker or w/c? No   TIMED UP AND GO: Was the test performed? No .   Cognitive Function:  Patient is alert and oriented x3.  Brain health information mailed to patient per request.    6CIT Screen 10/28/2020 10/17/2018  What Year? 0 points 0 points  What month? 0 points 0 points  What time? 0 points 0 points  Count back from 20 0 points 0 points  Months in reverse 0 points 0 points  Repeat phrase 0 points 0 points  Total Score 0 0    Immunizations Immunization History  Administered Date(s) Administered   Fluad Quad(high Dose 65+) 10/16/2020   Influenza-Unspecified 12/03/2019   PFIZER(Purple Top)SARS-COV-2 Vaccination 03/07/2019, 03/28/2019   Pneumococcal Conjugate-13 12/09/2019   TDAP status: Due, Education has been provided regarding the importance of this vaccine. Advised may receive this vaccine at local pharmacy or Health Dept. Aware to provide a copy of the vaccination record if obtained from local pharmacy or Health Dept. Verbalized acceptance and understanding. Deferred.   Shingrix vaccine-Due, Education has been provided regarding the importance of this vaccine. Advised may receive this vaccine at local pharmacy or Health Dept. Aware to provide a copy of the vaccination record if obtained from local pharmacy or Health Dept. Verbalized acceptance and understanding. Deferred  Health Maintenance Health Maintenance  Topic Date Due   HEMOGLOBIN A1C  10/17/2020   COVID-19 Vaccine (3 - Pfizer risk series) 11/13/2020 (Originally 04/25/2019)   Hepatitis C Screening  12/07/2020 (Originally 05/01/1958)   Zoster Vaccines- Shingrix (1 of 2) 01/27/2021 (Originally  05/01/1959)   TETANUS/TDAP  10/28/2021 (Originally 05/01/1959)   OPHTHALMOLOGY EXAM  10/31/2029 (Originally 12/02/2019)   FOOT EXAM  07/13/2021   COLONOSCOPY (Pts 45-21yrs Insurance coverage will need to be confirmed)  06/14/2022   INFLUENZA VACCINE  Completed   DEXA SCAN  Completed   HPV VACCINES  Aged Out   Mammogram- complete 10/2020.   Lung Cancer Screening: (Low Dose CT Chest recommended if Age 59-80 years, 30 pack-year currently smoking OR have quit w/in 15years.) does not qualify.   Hepatitis C Screening: Deferred for further discussion with pcp.   Vision Screening: Recommended annual ophthalmology exams for early detection of glaucoma and other disorders of the eye.  Dental Screening: Recommended annual dental exams for proper oral hygiene  Community Resource Referral / Chronic Care Management: CRR required this visit?  No   CCM required this visit?  No      Plan:   Keep all routine maintenance appointments.   I have personally reviewed and noted the following in the patient's chart:   Medical and social history Use of alcohol, tobacco or illicit drugs  Current medications and supplements including opioid prescriptions. Not taking opioid.  Functional ability and status Nutritional status Physical activity Advanced directives List of other physicians Hospitalizations, surgeries, and ER visits in previous 12 months Vitals Screenings to include cognitive, depression, and falls Referrals and appointments  In addition, I have  reviewed and discussed with patient certain preventive protocols, quality metrics, and best practice recommendations. A written personalized care plan for preventive services as well as general preventive health recommendations were provided to patient via mychart.     Varney Biles, LPN   [id number]

## 2020-10-28 NOTE — Patient Instructions (Addendum)
[name] , Thank you for taking time to come for your Medicare Wellness Visit. I appreciate your ongoing commitment to your health goals. Please review the following plan we discussed and let me know if I can assist you in the future.   These are the goals we discussed:  Goals       Patient Stated     Increase physical activity (pt-stated)      I would like to walk more for exercise        This is a list of the screening recommended for you and due dates:  Health Maintenance  Topic Date Due   Hemoglobin A1C  10/17/2020   COVID-19 Vaccine (3 - Pfizer risk series) 11/13/2020*   Hepatitis C Screening: USPSTF Recommendation to screen - Ages 18-79 yo.  12/07/2020*   Zoster (Shingles) Vaccine (1 of 2) 01/27/2021*   Tetanus Vaccine  10/28/2021*   Eye exam for diabetics  10/31/2029*   Complete foot exam   07/13/2021   Colon Cancer Screening  06/14/2022   Flu Shot  Completed   DEXA scan (bone density measurement)  Completed   HPV Vaccine  Aged Out  *Topic was postponed. The date shown is not the original due date.    Advanced directives: End of life planning; Advance aging; Advanced directives discussed.  Copy of current HCPOA/Living Will requested.    Conditions/risks identified: none new  Follow up in one year for your annual wellness visit    Preventive Care 65 Years and Older, Female Preventive care refers to lifestyle choices and visits with your health care provider that can promote health and wellness. What does preventive care include? A yearly physical exam. This is also called an annual well check. Dental exams once or twice a year. Routine eye exams. Ask your health care provider how often you should have your eyes checked. Personal lifestyle choices, including: Daily care of your teeth and gums. Regular physical activity. Eating a healthy diet. Avoiding tobacco and drug use. Limiting alcohol use. Practicing safe sex. Taking low-dose aspirin every day. Taking  vitamin and mineral supplements as recommended by your health care provider. What happens during an annual well check? The services and screenings done by your health care provider during your annual well check will depend on your age, overall health, lifestyle risk factors, and family history of disease. Counseling  Your health care provider may ask you questions about your: Alcohol use. Tobacco use. Drug use. Emotional well-being. Home and relationship well-being. Sexual activity. Eating habits. History of falls. Memory and ability to understand (cognition). Work and work Statistician. Reproductive health. Screening  You may have the following tests or measurements: Height, weight, and BMI. Blood pressure. Lipid and cholesterol levels. These may be checked every 5 years, or more frequently if you are over 56 years old. Skin check. Lung cancer screening. You may have this screening every year starting at age 27 if you have a 30-pack-year history of smoking and currently smoke or have quit within the past 15 years. Fecal occult blood test (FOBT) of the stool. You may have this test every year starting at age 56. Flexible sigmoidoscopy or colonoscopy. You may have a sigmoidoscopy every 5 years or a colonoscopy every 10 years starting at age 29. Hepatitis C blood test. Hepatitis B blood test. Sexually transmitted disease (STD) testing. Diabetes screening. This is done by checking your blood sugar (glucose) after you have not eaten for a while (fasting). You may have this done every  1-3 years. Bone density scan. This is done to screen for osteoporosis. You may have this done starting at age 35. Mammogram. This may be done every 1-2 years. Talk to your health care provider about how often you should have regular mammograms. Talk with your health care provider about your test results, treatment options, and if necessary, the need for more tests. Vaccines  Your health care provider may  recommend certain vaccines, such as: Influenza vaccine. This is recommended every year. Tetanus, diphtheria, and acellular pertussis (Tdap, Td) vaccine. You may need a Td booster every 10 years. Zoster vaccine. You may need this after age 78. Pneumococcal 13-valent conjugate (PCV13) vaccine. One dose is recommended after age 55. Pneumococcal polysaccharide (PPSV23) vaccine. One dose is recommended after age 48. Talk to your health care provider about which screenings and vaccines you need and how often you need them. This information is not intended to replace advice given to you by your health care provider. Make sure you discuss any questions you have with your health care provider. Document Released: 02/13/2015 Document Revised: 10/07/2015 Document Reviewed: 11/18/2014 Elsevier Interactive Patient Education  2017 [employer] Prevention in the Home Falls can cause injuries. They can happen to people of all ages. There are many things you can do to make your home safe and to help prevent falls. What can I do on the outside of my home? Regularly fix the edges of walkways and driveways and fix any cracks. Remove anything that might make you trip as you walk through a door, such as a raised step or threshold. Trim any bushes or trees on the path to your home. Use bright outdoor lighting. Clear any walking paths of anything that might make someone trip, such as rocks or tools. Regularly check to see if handrails are loose or broken. Make sure that both sides of any steps have handrails. Any raised decks and porches should have guardrails on the edges. Have any leaves, snow, or ice cleared regularly. Use sand or salt on walking paths during winter. Clean up any spills in your garage right away. This includes oil or grease spills. What can I do in the bathroom? Use night lights. Install grab bars by the toilet and in the tub and shower. Do not use towel bars as grab bars. Use non-skid  mats or decals in the tub or shower. If you need to sit down in the shower, use a plastic, non-slip stool. Keep the floor dry. Clean up any water that spills on the floor as soon as it happens. Remove soap buildup in the tub or shower regularly. Attach bath mats securely with double-sided non-slip rug tape. Do not have throw rugs and other things on the floor that can make you trip. What can I do in the bedroom? Use night lights. Make sure that you have a light by your bed that is easy to reach. Do not use any sheets or blankets that are too big for your bed. They should not hang down onto the floor. Have a firm chair that has side arms. You can use this for support while you get dressed. Do not have throw rugs and other things on the floor that can make you trip. What can I do in the kitchen? Clean up any spills right away. Avoid walking on wet floors. Keep items that you use a lot in easy-to-reach places. If you need to reach something above you, use a strong step stool that has a  grab bar. Keep electrical cords out of the way. Do not use floor polish or wax that makes floors slippery. If you must use wax, use non-skid floor wax. Do not have throw rugs and other things on the floor that can make you trip. What can I do with my stairs? Do not leave any items on the stairs. Make sure that there are handrails on both sides of the stairs and use them. Fix handrails that are broken or loose. Make sure that handrails are as long as the stairways. Check any carpeting to make sure that it is firmly attached to the stairs. Fix any carpet that is loose or worn. Avoid having throw rugs at the top or bottom of the stairs. If you do have throw rugs, attach them to the floor with carpet tape. Make sure that you have a light switch at the top of the stairs and the bottom of the stairs. If you do not have them, ask someone to add them for you. What else can I do to help prevent falls? Wear shoes  that: Do not have high heels. Have rubber bottoms. Are comfortable and fit you well. Are closed at the toe. Do not wear sandals. If you use a stepladder: Make sure that it is fully opened. Do not climb a closed stepladder. Make sure that both sides of the stepladder are locked into place. Ask someone to hold it for you, if possible. Clearly mark and make sure that you can see: Any grab bars or handrails. First and last steps. Where the edge of each step is. Use tools that help you move around (mobility aids) if they are needed. These include: Canes. Walkers. Scooters. Crutches. Turn on the lights when you go into a dark area. Replace any light bulbs as soon as they burn out. Set up your furniture so you have a clear path. Avoid moving your furniture around. If any of your floors are uneven, fix them. If there are any pets around you, be aware of where they are. Review your medicines with your doctor. Some medicines can make you feel dizzy. This can increase your chance of falling. Ask your doctor what other things that you can do to help prevent falls. This information is not intended to replace advice given to you by your health care provider. Make sure you discuss any questions you have with your health care provider. Document Released: 11/13/2008 Document Revised: 06/25/2015 Document Reviewed: 02/21/2014 Elsevier Interactive Patient Education  2017 [employer].

## 2020-10-29 ENCOUNTER — Other Ambulatory Visit: Payer: Self-pay

## 2020-10-29 ENCOUNTER — Inpatient Hospital Stay: Payer: Medicare Other | Attending: Radiation Oncology

## 2020-10-29 DIAGNOSIS — C50911 Malignant neoplasm of unspecified site of right female breast: Secondary | ICD-10-CM | POA: Diagnosis not present

## 2020-10-29 DIAGNOSIS — Z170 Estrogen receptor positive status [ER+]: Secondary | ICD-10-CM | POA: Diagnosis not present

## 2020-10-29 DIAGNOSIS — Z79899 Other long term (current) drug therapy: Secondary | ICD-10-CM | POA: Insufficient documentation

## 2020-10-29 DIAGNOSIS — Z87891 Personal history of nicotine dependence: Secondary | ICD-10-CM | POA: Diagnosis not present

## 2020-10-29 DIAGNOSIS — Z79811 Long term (current) use of aromatase inhibitors: Secondary | ICD-10-CM | POA: Insufficient documentation

## 2020-10-29 DIAGNOSIS — N183 Chronic kidney disease, stage 3 unspecified: Secondary | ICD-10-CM | POA: Insufficient documentation

## 2020-10-29 DIAGNOSIS — D649 Anemia, unspecified: Secondary | ICD-10-CM | POA: Diagnosis not present

## 2020-10-29 LAB — CBC WITH DIFFERENTIAL/PLATELET
Abs Immature Granulocytes: 0.04 10*3/uL (ref 0.00–0.07)
Basophils Absolute: 0 10*3/uL (ref 0.0–0.1)
Basophils Relative: 0 %
Eosinophils Absolute: 0.1 10*3/uL (ref 0.0–0.5)
Eosinophils Relative: 1 %
HCT: 35.7 % — ABNORMAL LOW (ref 36.0–46.0)
Hemoglobin: 11.2 g/dL — ABNORMAL LOW (ref 12.0–15.0)
Immature Granulocytes: 1 %
Lymphocytes Relative: 27 %
Lymphs Abs: 1.9 10*3/uL (ref 0.7–4.0)
MCH: 26 pg (ref 26.0–34.0)
MCHC: 31.4 g/dL (ref 30.0–36.0)
MCV: 83 fL (ref 80.0–100.0)
Monocytes Absolute: 0.4 10*3/uL (ref 0.1–1.0)
Monocytes Relative: 6 %
Neutro Abs: 4.6 10*3/uL (ref 1.7–7.7)
Neutrophils Relative %: 65 %
Platelets: 189 10*3/uL (ref 150–400)
RBC: 4.3 MIL/uL (ref 3.87–5.11)
RDW: 14.3 % (ref 11.5–15.5)
WBC: 7.1 10*3/uL (ref 4.0–10.5)
nRBC: 0 % (ref 0.0–0.2)

## 2020-10-29 LAB — COMPREHENSIVE METABOLIC PANEL
ALT: 15 U/L (ref 0–44)
AST: 19 U/L (ref 15–41)
Albumin: 4.3 g/dL (ref 3.5–5.0)
Alkaline Phosphatase: 85 U/L (ref 38–126)
Anion gap: 9 (ref 5–15)
BUN: 34 mg/dL — ABNORMAL HIGH (ref 8–23)
CO2: 25 mmol/L (ref 22–32)
Calcium: 9.6 mg/dL (ref 8.9–10.3)
Chloride: 99 mmol/L (ref 98–111)
Creatinine, Ser: 1.72 mg/dL — ABNORMAL HIGH (ref 0.44–1.00)
GFR, Estimated: 30 mL/min — ABNORMAL LOW (ref >60)
Glucose, Bld: 157 mg/dL — ABNORMAL HIGH (ref 70–99)
Potassium: 4.3 mmol/L (ref 3.5–5.1)
Sodium: 133 mmol/L — ABNORMAL LOW (ref 135–145)
Total Bilirubin: 0.6 mg/dL (ref 0.3–1.2)
Total Protein: 7.9 g/dL (ref 6.5–8.1)

## 2020-10-30 LAB — CANCER ANTIGEN 27.29: CA 27.29: 21 U/mL (ref 0.0–38.6)

## 2020-11-02 ENCOUNTER — Other Ambulatory Visit: Payer: Self-pay

## 2020-11-02 ENCOUNTER — Inpatient Hospital Stay: Payer: Medicare Other | Attending: Oncology | Admitting: Oncology

## 2020-11-02 ENCOUNTER — Encounter: Payer: Self-pay | Admitting: Oncology

## 2020-11-02 VITALS — BP 136/59 | HR 78 | Temp 98.1°F | Resp 16 | Wt 146.1 lb

## 2020-11-02 DIAGNOSIS — Z170 Estrogen receptor positive status [ER+]: Secondary | ICD-10-CM | POA: Diagnosis not present

## 2020-11-02 DIAGNOSIS — Z7984 Long term (current) use of oral hypoglycemic drugs: Secondary | ICD-10-CM | POA: Diagnosis not present

## 2020-11-02 DIAGNOSIS — Z9011 Acquired absence of right breast and nipple: Secondary | ICD-10-CM | POA: Diagnosis not present

## 2020-11-02 DIAGNOSIS — Z79899 Other long term (current) drug therapy: Secondary | ICD-10-CM | POA: Diagnosis not present

## 2020-11-02 DIAGNOSIS — Z79811 Long term (current) use of aromatase inhibitors: Secondary | ICD-10-CM | POA: Diagnosis not present

## 2020-11-02 DIAGNOSIS — Z87891 Personal history of nicotine dependence: Secondary | ICD-10-CM | POA: Diagnosis not present

## 2020-11-02 DIAGNOSIS — C50911 Malignant neoplasm of unspecified site of right female breast: Secondary | ICD-10-CM

## 2020-11-02 DIAGNOSIS — N1832 Chronic kidney disease, stage 3b: Secondary | ICD-10-CM

## 2020-11-02 DIAGNOSIS — R5383 Other fatigue: Secondary | ICD-10-CM | POA: Diagnosis not present

## 2020-11-02 DIAGNOSIS — D509 Iron deficiency anemia, unspecified: Secondary | ICD-10-CM | POA: Diagnosis not present

## 2020-11-02 DIAGNOSIS — D649 Anemia, unspecified: Secondary | ICD-10-CM

## 2020-11-02 NOTE — Progress Notes (Signed)
Hematology/Oncology  progress note [facility] [phone] [phone]   Patient Care Team: Burnard Hawthorne, FNP as PCP - General (Family Medicine) Theodore Demark, RN as Oncology Nurse Navigator (Oncology) Noreene Filbert, MD as Radiation Oncologist (Radiation Oncology)  REFERRING PROVIDER: Burnard Hawthorne, FNP  CHIEF COMPLAINTS/REASON FOR VISIT:  Follow up  for breast cancer  PERTINENT ONCOLOGY HISTORY MARIANE BURPEE is a  80 y.o.  female with PMH listed below was seen in consultation at the request of  Burnard Hawthorne, FNP  for evaluation of abnormal SPEP. Reviewed patient's previous blood work via care everywhere. 08/27/2018, free light chain ratio 1.97, free kappa 79.5, free lambda 40.3. Protein electrophoresis showed increased alpha globulin.  #Patient moved from [place] to [place] last year. She reports history of right breast cancer in 2002 and left breast cancer in 2011.  She underwent right breast lumpectomy followed by adjuvant radiation followed by 5 years of tamoxifen.  Underwent left lumpectomy followed by adjuvant radiation followed by aromatase inhibitor for 5 years.  Currently she is not on any antiestrogen treatment.  Denies any chemotherapy treatment history.  Both breast cancer treatments were done in [place]. Obtained records from [facility] clinic breast surgery [place] cancer [facility] Records were reviewed.  No pathology is available. 06/06/2017 screening mammogram showed postoperative changes in both breasts.  Architectural distortion has not changed significantly.  Skin thickening is similar.  Calcifications have increased mildly around the postoperative sites bilaterally.  No new dominant mass.   # Chronic history of kidney insufficiency, stage IIIb.  Patient sees Dr. Holley Raring.  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  # Breast exam was performed in seated and lying  down position. Right breast lower inner quadrant lumpectomy scar with focal scar tissue. Left breast upper outer quadrant, 1:00 lumpectomy scar with palpable round thickened breast tissue. No palpable axillary lymphadenopathy.  # #Family history of breast cancer in first-degree relatives, patient reports that she was previously tested negative for BRCA gene mutation.  Results were not available. Patient was referred to genetic counselor and 11/19/2019 [facility] Breast Cancer STAT Panel + Multi- cancer panel found no pathogenic mutations.   # 09/17/2019 bilateral screening mammogram showed right breast mass which warrants further evaluation.  No suspicious findings in left breast. 09/25/2019, right diagnostic breast mammogram showed specialist right breast mass at 10:00. Right axilla is negative for adenopathy.  Patient underwent ultrasound-guided core biopsy of the right breast 10:00 location. 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  Grade 2, no DCIS or LVI.  ER 90%, PR 11-50%, HER-2 negative.  Chronic microcytic anemia, iron panel is not consistent with iron deficiency. Normal hemoglobin evaluation.  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 Hemoglobin stable at 11.6. GI work-up includes EGD and colonoscopy on 06/14/2019.  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  Benign colon polyps.  No high-grade dysplasia or malignancy.  She follows up with Dr. Marius Ditch Anemia can be also secondary to CKD.  # #01/06/2020 patient underwent right mastectomy and sentinel lymph node biopsy.  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  No adjuvant chemotherapy will be offered She had mastectomy with negative margin, no need for adjuvant radiation. Baseline elevated CA 27.29--> post operation  normalization of  CEA 27.29 February 2020, started on Arimidex She was seen by Dr. Bary Castilla 04/02/2020 for seroma.  #INTERVAL HISTORY Tracy Lawrence is a 80 y.o. female who has above history reviewed by me today presents for follow up visit for breast cancer.  Patient tolerates Arimidex. Manageable hot flash.  Some fatigue, and joint pain.    Review of Systems  Constitutional:  Negative for appetite change, chills, fatigue and fever.  HENT:   Negative for hearing loss and voice change.   Eyes:  Negative for eye problems.  Respiratory:  Negative for chest tightness and cough.   Cardiovascular:  Negative for chest pain.  Gastrointestinal:  Negative for abdominal distention, abdominal pain and blood in stool.  Endocrine: Positive for hot flashes.  Genitourinary:  Negative for difficulty urinating and frequency.   Musculoskeletal:  Positive for arthralgias.  Skin:  Negative for itching and rash.  Neurological:  Negative for extremity weakness.  Hematological:  Negative for adenopathy.  Psychiatric/Behavioral:  Negative for confusion.   Right mastectomy MEDICAL HISTORY:  Past Medical History:  Diagnosis Date   Anemia    Arthritis    Breast cancer ([place]) 2002   right breast   Breast cancer ([place]) 2011   left breast   Cancer ([place])    Family history of breast cancer    Family history of lung cancer    Family history of non-Hodgkin's lymphoma    Family history of prostate cancer    Hypertension    Malignant neoplasm of right breast [facility])    Personal history of radiation therapy 2002, 2011   right and left breast   Pre-diabetes     SURGICAL HISTORY: Past Surgical History:  Procedure Laterality Date   ABDOMINAL HYSTERECTOMY  2018   BREAST BIOPSY Right 10/04/2018   u/s bx, vision marker, path pending   BREAST LUMPECTOMY Right 2002   breast ca with rad   BREAST LUMPECTOMY Left 2011   breast ca with rad   BREAST SURGERY  2011,2002   CATARACT EXTRACTION  2015   COLONOSCOPY      COLONOSCOPY WITH PROPOFOL N/A 06/14/2019   Procedure: COLONOSCOPY WITH PROPOFOL;  Surgeon: Lin Landsman, MD;  Location: [facility] ENDOSCOPY;  Service: Gastroenterology;  Laterality: N/A;   ESOPHAGOGASTRODUODENOSCOPY (EGD) WITH PROPOFOL N/A 06/14/2019   Procedure: ESOPHAGOGASTRODUODENOSCOPY (EGD) WITH PROPOFOL;  Surgeon: Lin Landsman, MD;  Location: [facility] ENDOSCOPY;  Service: Gastroenterology;  Laterality: N/A;   SIMPLE MASTECTOMY WITH AXILLARY SENTINEL NODE BIOPSY Right 01/06/2020   Procedure: SIMPLE MASTECTOMY WITH AXILLARY SENTINEL NODE BIOPSY;  Surgeon: Robert Bellow, MD;  Location: [facility] ORS;  Service: General;  Laterality: Right;    SOCIAL HISTORY: Social History   Socioeconomic History   Marital status: Married    Spouse name: Not on file   Number of children: Not on file   Years of education: Not on file   Highest education level: Not on file  Occupational History   Not on file  Tobacco Use   Smoking status: Former    Packs/day: 1.00    Years: 12.00    Pack years: 12.00    Types: Cigarettes    Quit date: 03/18/1979    Years since quitting: 41.6   Smokeless tobacco: Never   Tobacco comments:    quit 40 years ago  Vaping Use   Vaping Use: Never used  Substance and Sexual Activity   Alcohol use: Yes    Comment: rarely   Drug use: Never   Sexual activity:  Not on file  Other Topics Concern   Not on file  Social History Narrative   From [place]      Retired      Social Determinants of Radio broadcast assistant Strain: Low Risk    Difficulty of Paying Living Expenses: Not hard at all  Food Insecurity: No Food Insecurity   Worried About Charity fundraiser in the Last Year: Never true   Arboriculturist in the Last Year: Never true  Transportation Needs: No Transportation Needs   Lack of Transportation (Medical): No   Lack of Transportation (Non-Medical): No  Physical Activity: Unknown   Days of Exercise per Week: 0 days   Minutes of Exercise per  Session: Not on file  Stress: No Stress Concern Present   Feeling of Stress : Not at all  Social Connections: Unknown   Frequency of Communication with Friends and Family: Not on file   Frequency of Social Gatherings with Friends and Family: Not on file   Attends Religious Services: Not on Electrical engineer or Organizations: Not on file   Attends Archivist Meetings: Not on file   Marital Status: Married  Human resources officer Violence: Not At Risk   Fear of Current or Ex-Partner: No   Emotionally Abused: No   Physically Abused: No   Sexually Abused: No    FAMILY HISTORY: Family History  Problem Relation Age of Onset   Heart attack Mother    Cancer Father    Lung cancer Father    Breast cancer Sister 21       dx again 97   Heart attack Sister    Non-Hodgkin's lymphoma Sister 69   Heart attack Sister    Dementia Sister 21   Breast cancer Paternal Aunt    Breast cancer Maternal Aunt    Prostate cancer Paternal Grandfather        metastic, d. 34s   Colon cancer Neg Hx     ALLERGIES:  has No Known Allergies.  MEDICATIONS:  Current Outpatient Medications  Medication Sig Dispense Refill   acetaminophen (TYLENOL) 500 MG tablet Take 1,000 mg by mouth every 6 (six) hours as needed for moderate pain or headache.     allopurinol (ZYLOPRIM) 100 MG tablet TAKE 1 TABLET(100 MG) BY MOUTH DAILY 30 tablet 3   anastrozole (ARIMIDEX) 1 MG tablet TAKE 1 TABLET(1 MG) BY MOUTH DAILY 30 tablet 0   Calcium Carb-Cholecalciferol (CALCIUM 600 + D PO) Take 1 tablet by mouth daily.     escitalopram (LEXAPRO) 10 MG tablet TAKE 1 TABLET BY MOUTH EVERY DAY FOR DEPRESSION 90 tablet 0   ferrous sulfate 325 (65 FE) MG tablet Take 325 mg by mouth daily with breakfast.     hydrochlorothiazide (MICROZIDE) 12.5 MG capsule Take 1 capsule (12.5 mg total) by mouth daily. 90 capsule 1   losartan (COZAAR) 100 MG tablet Take 1 tablet (100 mg total) by mouth daily. 90 tablet 3   metFORMIN  (GLUCOPHAGE) 500 MG tablet Take 1 tablet (500 mg total) by mouth 2 (two) times daily with a meal. 180 tablet 2   Multiple Vitamins-Minerals (EYE VITAMINS PO) Take 1 tablet by mouth daily.      rosuvastatin (CRESTOR) 40 MG tablet TAKE 1 TABLET BY MOUTH EVERY DAY FOR CHOLESTEROL 90 tablet 1   calcium carbonate (TUMS - DOSED IN MG ELEMENTAL CALCIUM) 500 MG chewable tablet Chew 1,000 mg by mouth daily as needed for  indigestion or heartburn. (Patient not taking: Reported on 11/02/2020)     Carboxymethylcellul-Glycerin (LUBRICATING EYE DROPS OP) Place 1 drop into both eyes daily as needed (irritated eyes). (Patient not taking: Reported on 11/02/2020)     No current facility-administered medications for this visit.     PHYSICAL EXAMINATION: ECOG PERFORMANCE STATUS: 0 - Asymptomatic Vitals:   11/02/20 1302  BP: (!) 136/59  Pulse: 78  Resp: 16  Temp: 98.1 F (36.7 C)  SpO2: 100%   Filed Weights   11/02/20 1302  Weight: 146 lb 1.6 oz (66.3 kg)    Physical Exam Constitutional:      General: She is not in acute distress. HENT:     Head: Normocephalic and atraumatic.  Eyes:     General: No scleral icterus. Cardiovascular:     Rate and Rhythm: Normal rate and regular rhythm.     Heart sounds: Normal heart sounds.  Pulmonary:     Effort: Pulmonary effort is normal. No respiratory distress.     Breath sounds: No wheezing.  Abdominal:     General: Bowel sounds are normal. There is no distension.     Palpations: Abdomen is soft.  Musculoskeletal:        General: No deformity. Normal range of motion.     Cervical back: Normal range of motion and neck supple.  Skin:    General: Skin is warm and dry.     Findings: No erythema or rash.  Neurological:     Mental Status: She is alert and oriented to person, place, and time. Mental status is at baseline.     Cranial Nerves: No cranial nerve deficit.     Coordination: Coordination normal.  Psychiatric:        Mood and Affect: Mood normal.     LABORATORY DATA:  I have reviewed the data as listed Lab Results  Component Value Date   WBC 7.1 10/29/2020   HGB 11.2 (L) 10/29/2020   HCT 35.7 (L) 10/29/2020   MCV 83.0 10/29/2020   PLT 189 10/29/2020   Recent Labs    04/06/20 1411 04/16/20 1041 07/29/20 0941 10/29/20 1058  NA 135 137 137 133*  K 4.4 4.6 4.3 4.3  CL 99 102 104 99  CO2 $Re'28 29 27 25  'TOs$ GLUCOSE 116* 107* 100* 157*  BUN 22 20 27* 34*  CREATININE 1.04* 1.03 1.33* 1.72*  CALCIUM 9.5 9.5 9.7 9.6  GFRNONAA 55*  --  41* 30*  PROT 7.6 6.9 6.8 7.9  ALBUMIN 4.0 3.9 3.8 4.3  AST $Re'19 15 18 19  'Svj$ ALT $R'14 11 14 15  'hG$ ALKPHOS 65 70 64 85  BILITOT 0.6 0.5 0.9 0.6    Iron/TIBC/Ferritin/ %Sat    Component Value Date/Time   IRON 45 10/02/2019 1324   TIBC 283 10/02/2019 1324   FERRITIN 106 10/02/2019 1324   IRONPCTSAT 16 10/02/2019 1324      RADIOGRAPHIC STUDIES: I have personally reviewed the radiological images as listed and agreed with the findings in the report. DG Bone Density  Result Date: 10/14/2020 EXAM: DUAL X-RAY ABSORPTIOMETRY (DXA) FOR BONE MINERAL DENSITY IMPRESSION: Your patient Joylynn Defrancesco completed a BMD test on 10/14/2020 using the [place] (software version: 14.10) manufactured by [employer]. The following summarizes the results of our evaluation. Technologist: ECJ PATIENT BIOGRAPHICAL: Name: Deidre, Carino Patient ID: [id number] Birth Date: 1940-09-07 Height: 61.0 in. Gender: Female Exam Date: 10/14/2020 Weight: 148.5 lbs. Indications: Advanced Age, Caucasian, Diabetic, Height Loss,  History of Breast Cancer, History of Radiation, Hysterectomy, Oophorectomy Bilateral, Postmenopausal Fractures: Treatments: Anastrozole, Calcium, calcium w/ vit D, Metformin, Multi-Vitamin, Tradjenta DENSITOMETRY RESULTS: Site      Region     Measured Date Measured Age WHO Classification Young Adult T-score BMD         %Change vs. Previous Significant Change (*) AP Spine L1-L4 (L3) 10[age]14/[age]  78.4 Normal 2.3 1.469 g/cm2 -0.3% - AP Spine L1-L4 (L3) 09[age]11/2018 76.3 Normal 2.3 1.474 g/cm2 - - DualFemur Neck Right 10/14/2020 78.4 Normal 0.9 1.170 g/cm2 -0.4% - DualFemur Neck Right 09/11/2018 76.3 Normal 1.0 1.175 g/cm2 - - DualFemur Total Mean 10/14/2020 78.4 Normal 2.1 1.275 g/cm2 -2.7% Yes DualFemur Total Mean 09/11/2018 76.3 Normal 2.4 1.310 g/cm2 - - ASSESSMENT: The BMD measured at Femur Neck Right is 1.170 g/cm2 with a T-score of 0.9. This patient is considered normal according to [facility]) criteria. The scan quality is good. L-3 was excluded due to degenerative changes. Compared with prior study, there has been no significant change in the spine. Compared with prior study, there has been a significant decrease in the total hip. World Pharmacologist [facility]) criteria for post-menopausal, Caucasian Women: Normal:                   T-score at or above -1 SD Osteopenia/low bone mass: T-score between -1 and -2.5 SD Osteoporosis:             T-score at or below -2.5 SD RECOMMENDATIONS: 1. All patients should optimize calcium and vitamin D intake. 2. Consider FDA-approved medical therapies in postmenopausal women and men aged 85 years and older, based on the following: a. A hip or vertebral(clinical or morphometric) fracture b. T-score < -2.5 at the femoral neck or spine after appropriate evaluation to exclude secondary causes c. Low bone mass (T-score between -1.0 and -2.5 at the femoral neck or spine) and a 10-year probability of a hip fracture > 3% or a 10-year probability of a major osteoporosis-related fracture > 20% based on the [place]-adapted WHO algorithm 3. Clinician judgment and/or patient preferences may indicate treatment for people with 10-year fracture probabilities above or below these levels FOLLOW-UP: People with diagnosed cases of osteoporosis or at high risk for fracture should have regular bone mineral density tests. For patients eligible for Medicare, routine testing  is allowed once every 2 years. The testing frequency can be increased to one year for patients who have rapidly progressing disease, those who are receiving or discontinuing medical therapy to restore bone mass, or have additional risk factors. I have reviewed this report, and agree with the above findings. [facility] Electronically Signed   By: Rolm Baptise M.D.   On: 10/14/2020 14:43   MM 3D SCREEN BREAST UNI LEFT  Result Date: 10/20/2020 CLINICAL DATA:  Screening. EXAM: DIGITAL SCREENING UNILATERAL LEFT MAMMOGRAM WITH CAD AND TOMOSYNTHESIS TECHNIQUE: Left screening digital craniocaudal and mediolateral oblique mammograms were obtained. Left screening digital breast tomosynthesis was performed. The images were evaluated with computer-aided detection. COMPARISON:  Previous exam(s). ACR Breast Density Category b: There are scattered areas of fibroglandular density. FINDINGS: The patient has had a right mastectomy. There are no findings suspicious for malignancy. IMPRESSION: No mammographic evidence of malignancy. A result letter of this screening mammogram will be mailed directly to the patient. RECOMMENDATION: Screening mammogram in one year.  (Code:SM-L-49M) BI-RADS CATEGORY  1: Negative. Electronically Signed   By: Dorise Bullion III M.D.   On: 10/20/2020 07:05  ASSESSMENT & PLAN:  1. Malignant neoplasm of right breast in female, estrogen receptor positive, unspecified site of breast ([place])   2. Aromatase inhibitor use   3. Other fatigue   4. Stage 3b chronic kidney disease ([place])   5. Anemia, unspecified type    #Right breast invasive carcinoma, grade 2 ER 90%/PR 11-50%/HER-2 negative pT1b pN0 status post right mastectomy. Clinically doing well Labs are reviewed and discussed with patient. Continue Arimidex $RemoveBeforeDEI'1mg'[id number]$  daily.  Baseline elevated CA 27-29 has normalized, and remains stable. '  Bone health Normal bone density in August 2022 unilateral left screening mammogram in  Sept 2022 was reviewed with patient- normal. Recommend calcium $RemoveBeforeDEI'1200mg'[id number]$  and vitamin D supplementation.  #Chronic microcytic anemia, she has alpha thalassemia trait. Hb 11.2, stable.  Check iron panel at next visit.  # CKD, Avoid nephrotoxins. Kidney function is worse, she follows up with nephrology.  08/27/18 SPEP ordered by nephrology was negative for M protein.   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  All questions were answered. The patient knows to call the clinic with any problems questions or concerns.   Return of visit:  4 months.   Earlie Server, MD, PhD 11/02/2020

## 2020-11-02 NOTE — Progress Notes (Signed)
Appetite is too good. Energy is down some. Feels that joint pain is coming from her AI. Has mild infrequent hot flashes. Sleeping well at night. Denies any Breast issues. Bowels normal.

## 2020-11-16 ENCOUNTER — Ambulatory Visit: Payer: Medicare Other | Admitting: Family

## 2020-11-20 ENCOUNTER — Encounter: Payer: Self-pay | Admitting: General Surgery

## 2020-11-24 ENCOUNTER — Other Ambulatory Visit: Payer: Self-pay | Admitting: Oncology

## 2020-11-26 LAB — HM DIABETES EYE EXAM

## 2020-11-27 ENCOUNTER — Other Ambulatory Visit: Payer: Self-pay | Admitting: Family

## 2020-12-07 ENCOUNTER — Other Ambulatory Visit: Payer: Self-pay

## 2020-12-07 ENCOUNTER — Ambulatory Visit (INDEPENDENT_AMBULATORY_CARE_PROVIDER_SITE_OTHER): Payer: Medicare Other | Admitting: Family

## 2020-12-07 ENCOUNTER — Encounter: Payer: Self-pay | Admitting: Family

## 2020-12-07 VITALS — BP 130/68 | HR 69 | Temp 97.7°F | Ht 61.0 in | Wt 144.6 lb

## 2020-12-07 DIAGNOSIS — E119 Type 2 diabetes mellitus without complications: Secondary | ICD-10-CM | POA: Diagnosis not present

## 2020-12-07 DIAGNOSIS — I10 Essential (primary) hypertension: Secondary | ICD-10-CM

## 2020-12-07 DIAGNOSIS — E1169 Type 2 diabetes mellitus with other specified complication: Secondary | ICD-10-CM

## 2020-12-07 DIAGNOSIS — E785 Hyperlipidemia, unspecified: Secondary | ICD-10-CM

## 2020-12-07 DIAGNOSIS — E1122 Type 2 diabetes mellitus with diabetic chronic kidney disease: Secondary | ICD-10-CM | POA: Diagnosis not present

## 2020-12-07 DIAGNOSIS — N183 Chronic kidney disease, stage 3 unspecified: Secondary | ICD-10-CM

## 2020-12-07 LAB — POCT GLYCOSYLATED HEMOGLOBIN (HGB A1C): Hemoglobin A1C: 6 % — AB (ref 4.0–5.6)

## 2020-12-07 NOTE — Assessment & Plan Note (Signed)
Lab Results  Component Value Date   HGBA1C 6.0 (A) 12/07/2020   Excellent diabetic control.  Advised that she may decrease metformin to 500 daily as this is her desire.  She will focus more on low glycemic diet.  Follow-up in 3 months.  Pending BMP.  She will continue following with Dr. Holley Raring.  Counseled on the importance of continued avoidance of NSAIDs

## 2020-12-07 NOTE — Progress Notes (Signed)
Subjective:    Patient ID: Tracy Lawrence, female    DOB: [birth year], 80 y.o.   MRN: [id number]  CC: Tracy Lawrence is a 80 y.o. female who presents today for follow up.   HPI: Feels well today No complaints today    HTN- Compliant with hydrochlorothiazide 12.5 mg, losartan 100 mg . No cp, sob  DM-compliant metformin 500 mg twice daily   right breast cancer.  Continues to follow with Dr Tasia Catchings onc/hematology last seen 11/02/2020.  She is compliant with Arimidex. 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  Colonoscopy 2021, repeat due in 3 years  CKD- following with Dr Holley Raring. She avoid NSAIDs  Hyperlipidemia-compliant with Crestor 40 mg  HISTORY:  Past Medical History:  Diagnosis Date   Anemia    Arthritis    Breast cancer ([place]) 2002   right breast   Breast cancer ([place]) 2011   left breast   Cancer ([place])    Family history of breast cancer    Family history of lung cancer    Family history of non-Hodgkin's lymphoma    Family history of prostate cancer    Hypertension    Malignant neoplasm of right breast [facility])    Personal history of radiation therapy 2002, 2011   right and left breast   Pre-diabetes    Past Surgical History:  Procedure Laterality Date   ABDOMINAL HYSTERECTOMY  2018   BREAST BIOPSY Right 10/04/2018   u/s bx, vision marker, path pending   BREAST LUMPECTOMY Right 2002   breast ca with rad   BREAST LUMPECTOMY Left 2011   breast ca with rad   BREAST SURGERY  2011,2002   CATARACT EXTRACTION  2015   COLONOSCOPY     COLONOSCOPY WITH PROPOFOL N/A 06/14/2019   Procedure: COLONOSCOPY WITH PROPOFOL;  Surgeon: Lin Landsman, MD;  Location: [facility] ENDOSCOPY;  Service: Gastroenterology;  Laterality: N/A;   ESOPHAGOGASTRODUODENOSCOPY (EGD) WITH PROPOFOL N/A 06/14/2019   Procedure: ESOPHAGOGASTRODUODENOSCOPY (EGD) WITH PROPOFOL;  Surgeon: Lin Landsman, MD;  Location: [facility] ENDOSCOPY;  Service: Gastroenterology;   Laterality: N/A;   SIMPLE MASTECTOMY WITH AXILLARY SENTINEL NODE BIOPSY Right 01/06/2020   Procedure: SIMPLE MASTECTOMY WITH AXILLARY SENTINEL NODE BIOPSY;  Surgeon: Robert Bellow, MD;  Location: [facility] ORS;  Service: General;  Laterality: Right;   Family History  Problem Relation Age of Onset   Heart attack Mother    Cancer Father    Lung cancer Father    Breast cancer Sister 63       dx again 49   Heart attack Sister    Non-Hodgkin's lymphoma Sister 17   Heart attack Sister    Dementia Sister 36   Breast cancer Paternal Aunt    Breast cancer Maternal Aunt    Prostate cancer Paternal Grandfather        metastic, d. 44s   Colon cancer Neg Hx     Allergies: Patient has no known allergies. Current Outpatient Medications on File Prior to Visit  Medication Sig Dispense Refill   acetaminophen (TYLENOL) 500 MG tablet Take 1,000 mg by mouth every 6 (six) hours as needed for moderate pain or headache.     allopurinol (ZYLOPRIM) 100 MG tablet TAKE 1 TABLET(100 MG) BY MOUTH DAILY 30 tablet 3   anastrozole (ARIMIDEX) 1 MG tablet TAKE 1 TABLET(1 MG) BY MOUTH DAILY 90 tablet 2   Calcium Carb-Cholecalciferol (CALCIUM 600 + D PO) Take 1 tablet by mouth daily.  calcium carbonate (TUMS - DOSED IN MG ELEMENTAL CALCIUM) 500 MG chewable tablet Chew 1,000 mg by mouth daily as needed for indigestion or heartburn.     Carboxymethylcellul-Glycerin (LUBRICATING EYE DROPS OP) Place 1 drop into both eyes daily as needed (irritated eyes).     escitalopram (LEXAPRO) 10 MG tablet TAKE 1 TABLET BY MOUTH EVERY DAY FOR DEPRESSION 90 tablet 0   ferrous sulfate 325 (65 FE) MG tablet Take 325 mg by mouth daily with breakfast.     hydrochlorothiazide (MICROZIDE) 12.5 MG capsule Take 1 capsule (12.5 mg total) by mouth daily. 90 capsule 1   losartan (COZAAR) 100 MG tablet Take 1 tablet (100 mg total) by mouth daily. 90 tablet 3   metFORMIN (GLUCOPHAGE) 500 MG tablet Take 1 tablet (500 mg total) by mouth 2 (two)  times daily with a meal. 180 tablet 2   Multiple Vitamins-Minerals (EYE VITAMINS PO) Take 1 tablet by mouth daily.      rosuvastatin (CRESTOR) 40 MG tablet TAKE 1 TABLET BY MOUTH EVERY DAY FOR CHOLESTEROL 90 tablet 1   No current facility-administered medications on file prior to visit.    Social History   Tobacco Use   Smoking status: Former    Packs/day: 1.00    Years: 12.00    Pack years: 12.00    Types: Cigarettes    Quit date: 03/18/1979    Years since quitting: 41.7   Smokeless tobacco: Never   Tobacco comments:    quit 40 years ago  Vaping Use   Vaping Use: Never used  Substance Use Topics   Alcohol use: Yes    Comment: rarely   Drug use: Never    Review of Systems  Constitutional:  Negative for chills and fever.  Respiratory:  Negative for cough.   Cardiovascular:  Negative for chest pain and palpitations.  Gastrointestinal:  Negative for nausea and vomiting.     Objective:    BP 130/68 (BP Location: Left Arm, Patient Position: Sitting, Cuff Size: Normal)   Pulse 69   Temp 97.7 F (36.5 C) (Oral)   Ht 5\' 1"  (1.549 m)   Wt 144 lb 9.6 oz (65.6 kg)   LMP  (LMP Unknown)   SpO2 99%   BMI 27.32 kg/m  BP Readings from Last 3 Encounters:  12/07/20 130/68  11/02/20 (!) 136/59  07/29/20 135/73   Wt Readings from Last 3 Encounters:  12/07/20 144 lb 9.6 oz (65.6 kg)  11/02/20 146 lb 1.6 oz (66.3 kg)  10/28/20 148 lb (67.1 kg)    Physical Exam Vitals reviewed.  Constitutional:      Appearance: She is well-developed.  Eyes:     Conjunctiva/sclera: Conjunctivae normal.  Cardiovascular:     Rate and Rhythm: Normal rate and regular rhythm.     Pulses: Normal pulses.     Heart sounds: Normal heart sounds.  Pulmonary:     Effort: Pulmonary effort is normal.     Breath sounds: Normal breath sounds. No wheezing, rhonchi or rales.  Skin:    General: Skin is warm and dry.  Neurological:     Mental Status: She is alert.  Psychiatric:        Speech: Speech  normal.        Behavior: Behavior normal.        Thought Content: Thought content normal.       Assessment & Plan:   Problem List Items Addressed This Visit       Cardiovascular and Mediastinum  Essential hypertension    Chronic, stable.  Continue hydrochlorothiazide 12.5 mg, losartan 100 mg .         Endocrine   CKD stage 3 due to type 2 diabetes mellitus ([place])    Lab Results  Component Value Date   HGBA1C 6.0 (A) 12/07/2020  Excellent diabetic control.  Advised that she may decrease metformin to 500 daily as this is her desire.  She will focus more on low glycemic diet.  Follow-up in 3 months.  Pending BMP.  She will continue following with Dr. Holley Raring.  Counseled on the importance of continued avoidance of NSAIDs      Diabetes mellitus without complication ([place]) - Primary   Relevant Orders   POCT HgB A1C (Completed)   Basic metabolic panel   Hyperlipidemia associated with type 2 diabetes mellitus (HCC)    Chronic, stable.  Continue Crestor 40mg         I am having Venisha Boehning. Marchi maintain her Multiple Vitamins-Minerals (EYE VITAMINS PO), Calcium Carb-Cholecalciferol (CALCIUM 600 + D PO), acetaminophen, calcium carbonate, Carboxymethylcellul-Glycerin (LUBRICATING EYE DROPS OP), ferrous sulfate, losartan, metFORMIN, hydrochlorothiazide, rosuvastatin, allopurinol, anastrozole, and escitalopram.   No orders of the defined types were placed in this encounter.   Return precautions given.   Risks, benefits, and alternatives of the medications and treatment plan prescribed today were discussed, and patient expressed understanding.   Education regarding symptom management and diagnosis given to patient on AVS.  Continue to follow with Burnard Hawthorne, FNP for routine health maintenance.   Georgetta Haber and I agreed with plan.   Mable Paris, FNP

## 2020-12-07 NOTE — Assessment & Plan Note (Signed)
Chronic, stable.  Continue Crestor 40mg 

## 2020-12-07 NOTE — Assessment & Plan Note (Signed)
Chronic, stable.  Continue  hydrochlorothiazide 12.5 mg, losartan 100 mg  

## 2020-12-14 ENCOUNTER — Other Ambulatory Visit: Payer: Self-pay

## 2020-12-14 ENCOUNTER — Other Ambulatory Visit (INDEPENDENT_AMBULATORY_CARE_PROVIDER_SITE_OTHER): Payer: Medicare Other

## 2020-12-14 DIAGNOSIS — E119 Type 2 diabetes mellitus without complications: Secondary | ICD-10-CM | POA: Diagnosis not present

## 2020-12-14 LAB — BASIC METABOLIC PANEL
BUN: 27 mg/dL — ABNORMAL HIGH (ref 6–23)
CO2: 29 mEq/L (ref 19–32)
Calcium: 10.1 mg/dL (ref 8.4–10.5)
Chloride: 96 mEq/L (ref 96–112)
Creatinine, Ser: 1.47 mg/dL — ABNORMAL HIGH (ref 0.40–1.20)
GFR: 33.96 mL/min — ABNORMAL LOW (ref >60.00)
Glucose, Bld: 95 mg/dL (ref 70–99)
Potassium: 4.7 mEq/L (ref 3.5–5.1)
Sodium: 132 mEq/L — ABNORMAL LOW (ref 135–145)

## 2021-01-05 ENCOUNTER — Other Ambulatory Visit: Payer: Self-pay | Admitting: Family

## 2021-01-15 ENCOUNTER — Other Ambulatory Visit: Payer: Self-pay | Admitting: Family

## 2021-01-23 ENCOUNTER — Other Ambulatory Visit: Payer: Self-pay | Admitting: Family

## 2021-01-23 DIAGNOSIS — I10 Essential (primary) hypertension: Secondary | ICD-10-CM

## 2021-02-05 ENCOUNTER — Other Ambulatory Visit: Payer: Self-pay | Admitting: Family

## 2021-02-08 ENCOUNTER — Other Ambulatory Visit: Payer: Self-pay | Admitting: General Surgery

## 2021-02-08 DIAGNOSIS — Z1231 Encounter for screening mammogram for malignant neoplasm of breast: Secondary | ICD-10-CM

## 2021-03-02 ENCOUNTER — Other Ambulatory Visit: Payer: Self-pay | Admitting: *Deleted

## 2021-03-02 DIAGNOSIS — C50911 Malignant neoplasm of unspecified site of right female breast: Secondary | ICD-10-CM

## 2021-03-02 DIAGNOSIS — D509 Iron deficiency anemia, unspecified: Secondary | ICD-10-CM

## 2021-03-07 ENCOUNTER — Other Ambulatory Visit: Payer: Self-pay | Admitting: Family

## 2021-03-08 ENCOUNTER — Inpatient Hospital Stay: Payer: Medicare Other | Attending: Oncology

## 2021-03-08 ENCOUNTER — Other Ambulatory Visit: Payer: Self-pay

## 2021-03-08 DIAGNOSIS — D563 Thalassemia minor: Secondary | ICD-10-CM | POA: Insufficient documentation

## 2021-03-08 DIAGNOSIS — Z79899 Other long term (current) drug therapy: Secondary | ICD-10-CM | POA: Insufficient documentation

## 2021-03-08 DIAGNOSIS — Z9011 Acquired absence of right breast and nipple: Secondary | ICD-10-CM | POA: Insufficient documentation

## 2021-03-08 DIAGNOSIS — C50911 Malignant neoplasm of unspecified site of right female breast: Secondary | ICD-10-CM | POA: Diagnosis not present

## 2021-03-08 DIAGNOSIS — Z7984 Long term (current) use of oral hypoglycemic drugs: Secondary | ICD-10-CM | POA: Insufficient documentation

## 2021-03-08 DIAGNOSIS — Z170 Estrogen receptor positive status [ER+]: Secondary | ICD-10-CM | POA: Insufficient documentation

## 2021-03-08 DIAGNOSIS — Z79811 Long term (current) use of aromatase inhibitors: Secondary | ICD-10-CM | POA: Insufficient documentation

## 2021-03-08 DIAGNOSIS — D509 Iron deficiency anemia, unspecified: Secondary | ICD-10-CM

## 2021-03-08 DIAGNOSIS — N1832 Chronic kidney disease, stage 3b: Secondary | ICD-10-CM | POA: Diagnosis not present

## 2021-03-08 DIAGNOSIS — D631 Anemia in chronic kidney disease: Secondary | ICD-10-CM | POA: Diagnosis not present

## 2021-03-08 LAB — IRON AND TIBC
Iron: 50 ug/dL (ref 28–170)
Saturation Ratios: 18 % (ref 10.4–31.8)
TIBC: 274 ug/dL (ref 250–450)
UIBC: 224 ug/dL

## 2021-03-08 LAB — CBC WITH DIFFERENTIAL/PLATELET
Abs Immature Granulocytes: 0.03 10*3/uL (ref 0.00–0.07)
Basophils Absolute: 0 10*3/uL (ref 0.0–0.1)
Basophils Relative: 0 %
Eosinophils Absolute: 0.1 10*3/uL (ref 0.0–0.5)
Eosinophils Relative: 2 %
HCT: 33.6 % — ABNORMAL LOW (ref 36.0–46.0)
Hemoglobin: 10.4 g/dL — ABNORMAL LOW (ref 12.0–15.0)
Immature Granulocytes: 0 %
Lymphocytes Relative: 23 %
Lymphs Abs: 1.9 10*3/uL (ref 0.7–4.0)
MCH: 26 pg (ref 26.0–34.0)
MCHC: 31 g/dL (ref 30.0–36.0)
MCV: 84 fL (ref 80.0–100.0)
Monocytes Absolute: 0.6 10*3/uL (ref 0.1–1.0)
Monocytes Relative: 7 %
Neutro Abs: 5.6 10*3/uL (ref 1.7–7.7)
Neutrophils Relative %: 68 %
Platelets: 214 10*3/uL (ref 150–400)
RBC: 4 MIL/uL (ref 3.87–5.11)
RDW: 14.6 % (ref 11.5–15.5)
WBC: 8.3 10*3/uL (ref 4.0–10.5)
nRBC: 0 % (ref 0.0–0.2)

## 2021-03-08 LAB — COMPREHENSIVE METABOLIC PANEL
ALT: 12 U/L (ref 0–44)
AST: 17 U/L (ref 15–41)
Albumin: 3.8 g/dL (ref 3.5–5.0)
Alkaline Phosphatase: 74 U/L (ref 38–126)
Anion gap: 8 (ref 5–15)
BUN: 27 mg/dL — ABNORMAL HIGH (ref 8–23)
CO2: 28 mmol/L (ref 22–32)
Calcium: 9.3 mg/dL (ref 8.9–10.3)
Chloride: 98 mmol/L (ref 98–111)
Creatinine, Ser: 1.3 mg/dL — ABNORMAL HIGH (ref 0.44–1.00)
GFR, Estimated: 42 mL/min — ABNORMAL LOW (ref >60)
Glucose, Bld: 122 mg/dL — ABNORMAL HIGH (ref 70–99)
Potassium: 3.6 mmol/L (ref 3.5–5.1)
Sodium: 134 mmol/L — ABNORMAL LOW (ref 135–145)
Total Bilirubin: 0.2 mg/dL — ABNORMAL LOW (ref 0.3–1.2)
Total Protein: 7.1 g/dL (ref 6.5–8.1)

## 2021-03-08 LAB — RETIC PANEL
Immature Retic Fract: 8.5 % (ref 2.3–15.9)
RBC.: 3.96 MIL/uL (ref 3.87–5.11)
Retic Count, Absolute: 77.6 10*3/uL (ref 19.0–186.0)
Retic Ct Pct: 2 % (ref 0.4–3.1)
Reticulocyte Hemoglobin: 29.4 pg (ref >27.9)

## 2021-03-08 LAB — FERRITIN: Ferritin: 143 ng/mL (ref 11–307)

## 2021-03-10 ENCOUNTER — Encounter: Payer: Self-pay | Admitting: Oncology

## 2021-03-10 ENCOUNTER — Inpatient Hospital Stay (HOSPITAL_BASED_OUTPATIENT_CLINIC_OR_DEPARTMENT_OTHER): Payer: Medicare Other | Admitting: Oncology

## 2021-03-10 ENCOUNTER — Other Ambulatory Visit: Payer: Self-pay

## 2021-03-10 VITALS — BP 136/68 | HR 86 | Temp 96.3°F | Resp 16 | Ht 61.0 in | Wt 142.3 lb

## 2021-03-10 DIAGNOSIS — C50911 Malignant neoplasm of unspecified site of right female breast: Secondary | ICD-10-CM | POA: Diagnosis not present

## 2021-03-10 DIAGNOSIS — Z79811 Long term (current) use of aromatase inhibitors: Secondary | ICD-10-CM

## 2021-03-10 DIAGNOSIS — N1832 Chronic kidney disease, stage 3b: Secondary | ICD-10-CM | POA: Diagnosis not present

## 2021-03-10 DIAGNOSIS — Z170 Estrogen receptor positive status [ER+]: Secondary | ICD-10-CM | POA: Diagnosis not present

## 2021-03-10 DIAGNOSIS — D631 Anemia in chronic kidney disease: Secondary | ICD-10-CM

## 2021-03-10 MED ORDER — FERROUS SULFATE 325 (65 FE) MG PO TABS: 325.0000 mg | ORAL_TABLET | Freq: Two times a day (BID) | ORAL | 0 refills | Status: DC

## 2021-03-10 NOTE — Progress Notes (Signed)
Hematology/Oncology Progress note [phone] [phone]      Patient Care Team: Burnard Hawthorne, FNP as PCP - General (Family Medicine) Theodore Demark, RN as Oncology Nurse Navigator (Oncology) Noreene Filbert, MD as Radiation Oncologist (Radiation Oncology)  REFERRING PROVIDER: Burnard Hawthorne, FNP  CHIEF COMPLAINTS/REASON FOR VISIT:  Follow up  for breast cancer  PERTINENT ONCOLOGY HISTORY Tracy Lawrence is a  81 y.o.  female with PMH listed below was seen in consultation at the request of  Burnard Hawthorne, FNP  for evaluation of abnormal SPEP. Reviewed patient's previous blood work via care everywhere. 08/27/2018, free light chain ratio 1.97, free kappa 79.5, free lambda 40.3. Protein electrophoresis showed increased alpha globulin.  #Patient moved from [place] to [place] last year. She reports history of right breast cancer in 2002 and left breast cancer in 2011.  She underwent right breast lumpectomy followed by adjuvant radiation followed by 5 years of tamoxifen.  Underwent left lumpectomy followed by adjuvant radiation followed by aromatase inhibitor for 5 years.  Currently she is not on any antiestrogen treatment.  Denies any chemotherapy treatment history.  Both breast cancer treatments were done in [place]. Obtained records from [facility] breast surgery [place] Records were reviewed.  No pathology is available. 06/06/2017 screening mammogram showed postoperative changes in both breasts.  Architectural distortion has not changed significantly.  Skin thickening is similar.  Calcifications have increased mildly around the postoperative sites bilaterally.  No new dominant mass.   # Chronic history of kidney insufficiency, stage IIIb.  Patient sees Dr. Holley Raring.  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  # Breast exam was performed in seated and lying down position. Right breast  lower inner quadrant lumpectomy scar with focal scar tissue. Left breast upper outer quadrant, 1:00 lumpectomy scar with palpable round thickened breast tissue. No palpable axillary lymphadenopathy.  # #Family history of breast cancer in first-degree relatives, patient reports that she was previously tested negative for BRCA gene mutation.  Results were not available. Patient was referred to genetic counselor and 11/19/2019 [facility] Breast Cancer STAT Panel + Multi- cancer panel found no pathogenic mutations.   # 09/17/2019 bilateral screening mammogram showed right breast mass which warrants further evaluation.  No suspicious findings in left breast. 09/25/2019, right diagnostic breast mammogram showed specialist right breast mass at 10:00. Right axilla is negative for adenopathy.  Patient underwent ultrasound-guided core biopsy of the right breast 10:00 location. 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  Grade 2, no DCIS or LVI.  ER 90%, PR 11-50%, HER-2 negative.  Chronic microcytic anemia, iron panel is not consistent with iron deficiency. Normal hemoglobin evaluation.  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 Hemoglobin stable at 11.6. GI work-up includes EGD and colonoscopy on 06/14/2019.  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  Benign colon polyps.  No high-grade dysplasia or malignancy.  She follows up with Dr. Marius Ditch Anemia can be also secondary to CKD.  # #01/06/2020 patient underwent right mastectomy and sentinel lymph node biopsy.  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  No adjuvant chemotherapy will be offered She had mastectomy with negative margin, no need for adjuvant radiation. Baseline elevated CA 27.29--> post operation normalization of CEA  27.29 February 2020, started on Arimidex She was seen by Dr. Bary Castilla 04/02/2020 for seroma.  #INTERVAL HISTORY Tracy Lawrence is a 81 y.o. female who has above history reviewed by me today presents for follow up visit for breast cancer.  Patient tolerates Arimidex well. She has manageable hot flash.  + Joint pain.    Review of Systems  Constitutional:  Negative for appetite change, chills, fatigue and fever.  HENT:   Negative for hearing loss and voice change.   Eyes:  Negative for eye problems.  Respiratory:  Negative for chest tightness and cough.   Cardiovascular:  Negative for chest pain.  Gastrointestinal:  Negative for abdominal distention, abdominal pain and blood in stool.  Endocrine: Positive for hot flashes.  Genitourinary:  Negative for difficulty urinating and frequency.   Musculoskeletal:  Positive for arthralgias.  Skin:  Negative for itching and rash.  Neurological:  Negative for extremity weakness.  Hematological:  Negative for adenopathy.  Psychiatric/Behavioral:  Negative for confusion.   Right mastectomy MEDICAL HISTORY:  Past Medical History:  Diagnosis Date   Anemia    Arthritis    Breast cancer ([place]) 2002   right breast   Breast cancer ([place]) 2011   left breast   Cancer ([place])    Family history of breast cancer    Family history of lung cancer    Family history of non-Hodgkin's lymphoma    Family history of prostate cancer    Hypertension    Malignant neoplasm of right breast [facility])    Personal history of radiation therapy 2002, 2011   right and left breast   Pre-diabetes     SURGICAL HISTORY: Past Surgical History:  Procedure Laterality Date   ABDOMINAL HYSTERECTOMY  2018   BREAST BIOPSY Right 10/04/2018   u/s bx, vision marker, path pending   BREAST LUMPECTOMY Right 2002   breast ca with rad   BREAST LUMPECTOMY Left 2011   breast ca with rad   BREAST SURGERY  2011,2002   CATARACT EXTRACTION  2015   COLONOSCOPY     COLONOSCOPY WITH  PROPOFOL N/A 06/14/2019   Procedure: COLONOSCOPY WITH PROPOFOL;  Surgeon: Lin Landsman, MD;  Location: [facility] ENDOSCOPY;  Service: Gastroenterology;  Laterality: N/A;   ESOPHAGOGASTRODUODENOSCOPY (EGD) WITH PROPOFOL N/A 06/14/2019   Procedure: ESOPHAGOGASTRODUODENOSCOPY (EGD) WITH PROPOFOL;  Surgeon: Lin Landsman, MD;  Location: [facility] ENDOSCOPY;  Service: Gastroenterology;  Laterality: N/A;   SIMPLE MASTECTOMY WITH AXILLARY SENTINEL NODE BIOPSY Right 01/06/2020   Procedure: SIMPLE MASTECTOMY WITH AXILLARY SENTINEL NODE BIOPSY;  Surgeon: Robert Bellow, MD;  Location: [facility] ORS;  Service: General;  Laterality: Right;    SOCIAL HISTORY: Social History   Socioeconomic History   Marital status: Married    Spouse name: Not on file   Number of children: Not on file   Years of education: Not on file   Highest education level: Not on file  Occupational History   Not on file  Tobacco Use   Smoking status: Former    Packs/day: 1.00    Years: 12.00    Pack years: 12.00    Types: Cigarettes    Quit date: 03/18/1979    Years since quitting: 42.0   Smokeless tobacco: Never   Tobacco comments:    quit 40 years ago  Vaping Use   Vaping Use: Never used  Substance and Sexual Activity   Alcohol use: Yes    Comment: rarely   Drug use: Never   Sexual activity: Not on  file  Other Topics Concern   Not on file  Social History Narrative   From [place]      Retired      Social Determinants of Radio broadcast assistant Strain: Low Risk    Difficulty of Paying Living Expenses: Not hard at all  Food Insecurity: No Food Insecurity   Worried About Charity fundraiser in the Last Year: Never true   Arboriculturist in the Last Year: Never true  Transportation Needs: No Transportation Needs   Lack of Transportation (Medical): No   Lack of Transportation (Non-Medical): No  Physical Activity: Unknown   Days of Exercise per Week: 0 days   Minutes of Exercise per Session: Not on file   Stress: No Stress Concern Present   Feeling of Stress : Not at all  Social Connections: Unknown   Frequency of Communication with Friends and Family: Not on file   Frequency of Social Gatherings with Friends and Family: Not on file   Attends Religious Services: Not on Electrical engineer or Organizations: Not on file   Attends Archivist Meetings: Not on file   Marital Status: Married  Human resources officer Violence: Not At Risk   Fear of Current or Ex-Partner: No   Emotionally Abused: No   Physically Abused: No   Sexually Abused: No    FAMILY HISTORY: Family History  Problem Relation Age of Onset   Heart attack Mother    Cancer Father    Lung cancer Father    Breast cancer Sister 45       dx again 49   Heart attack Sister    Non-Hodgkin's lymphoma Sister 45   Heart attack Sister    Dementia Sister 49   Breast cancer Paternal Aunt    Breast cancer Maternal Aunt    Prostate cancer Paternal Grandfather        metastic, d. 23s   Colon cancer Neg Hx     ALLERGIES:  has No Known Allergies.  MEDICATIONS:  Current Outpatient Medications  Medication Sig Dispense Refill   acetaminophen (TYLENOL) 500 MG tablet Take 1,000 mg by mouth every 6 (six) hours as needed for moderate pain or headache.     allopurinol (ZYLOPRIM) 100 MG tablet TAKE 1 TABLET(100 MG) BY MOUTH DAILY 30 tablet 3   anastrozole (ARIMIDEX) 1 MG tablet TAKE 1 TABLET(1 MG) BY MOUTH DAILY 90 tablet 2   Calcium Carb-Cholecalciferol (CALCIUM 600 + D PO) Take 1 tablet by mouth daily.     Carboxymethylcellul-Glycerin (LUBRICATING EYE DROPS OP) Place 1 drop into both eyes daily as needed (irritated eyes).     escitalopram (LEXAPRO) 10 MG tablet TAKE 1 TABLET BY MOUTH EVERY DAY FOR DEPRESSION 90 tablet 0   hydrochlorothiazide (MICROZIDE) 12.5 MG capsule TAKE 1 CAPSULE(12.5 MG) BY MOUTH DAILY 90 capsule 1   losartan (COZAAR) 100 MG tablet Take 1 tablet (100 mg total) by mouth daily. 90 tablet 3   Multiple  Vitamins-Minerals (EYE VITAMINS PO) Take 1 tablet by mouth daily.      rosuvastatin (CRESTOR) 40 MG tablet TAKE 1 TABLET BY MOUTH EVERY DAY FOR CHOLESTEROL 90 tablet 0   calcium carbonate (TUMS - DOSED IN MG ELEMENTAL CALCIUM) 500 MG chewable tablet Chew 1,000 mg by mouth daily as needed for indigestion or heartburn. (Patient not taking: Reported on 03/10/2021)     ferrous sulfate 325 (65 FE) MG tablet Take 1 tablet (325 mg total) by mouth  2 (two) times daily with a meal. 60 tablet 0   metFORMIN (GLUCOPHAGE) 500 MG tablet TAKE 1 TABLET(500 MG) BY MOUTH TWICE DAILY WITH A MEAL 180 tablet 2   No current facility-administered medications for this visit.     PHYSICAL EXAMINATION: ECOG PERFORMANCE STATUS: 0 - Asymptomatic Vitals:   03/10/21 1417  BP: 136/68  Pulse: 86  Resp: 16  Temp: (!) 96.3 F (35.7 C)  SpO2: 100%   Filed Weights   03/10/21 1417  Weight: 142 lb 4.8 oz (64.5 kg)    Physical Exam Constitutional:      General: She is not in acute distress. HENT:     Head: Normocephalic and atraumatic.  Eyes:     General: No scleral icterus. Cardiovascular:     Rate and Rhythm: Normal rate and regular rhythm.     Heart sounds: Normal heart sounds.  Pulmonary:     Effort: Pulmonary effort is normal. No respiratory distress.     Breath sounds: No wheezing.  Abdominal:     General: Bowel sounds are normal. There is no distension.     Palpations: Abdomen is soft.  Musculoskeletal:        General: No deformity. Normal range of motion.     Cervical back: Normal range of motion and neck supple.  Skin:    General: Skin is warm and dry.     Findings: No erythema or rash.  Neurological:     Mental Status: She is alert and oriented to person, place, and time. Mental status is at baseline.     Cranial Nerves: No cranial nerve deficit.     Coordination: Coordination normal.  Psychiatric:        Mood and Affect: Mood normal.  Breast exam was performed in seated and lying down  position. Right breast mastectomy.  Left breast upper outer quadrant, 1:00 lumpectomy scar with palpable round thickened breast tissue.   LABORATORY DATA:  I have reviewed the data as listed Lab Results  Component Value Date   WBC 8.3 03/08/2021   HGB 10.4 (L) 03/08/2021   HCT 33.6 (L) 03/08/2021   MCV 84.0 03/08/2021   PLT 214 03/08/2021   Recent Labs    07/29/20 0941 10/29/20 1058 12/14/20 1049 03/08/21 1255  NA 137 133* 132* 134*  K 4.3 4.3 4.7 3.6  CL 104 99 96 98  CO2 '27 25 29 28  ' GLUCOSE 100* 157* 95 122*  BUN 27* 34* 27* 27*  CREATININE 1.33* 1.72* 1.47* 1.30*  CALCIUM 9.7 9.6 10.1 9.3  GFRNONAA 41* 30*  --  42*  PROT 6.8 7.9  --  7.1  ALBUMIN 3.8 4.3  --  3.8  AST 18 19  --  17  ALT 14 15  --  12  ALKPHOS 64 85  --  74  BILITOT 0.9 0.6  --  0.2*    Iron/TIBC/Ferritin/ %Sat    Component Value Date/Time   IRON 50 03/08/2021 1255   TIBC 274 03/08/2021 1255   FERRITIN 143 03/08/2021 1255   IRONPCTSAT 18 03/08/2021 1255      RADIOGRAPHIC STUDIES: I have personally reviewed the radiological images as listed and agreed with the findings in the report. No results found.     ASSESSMENT & PLAN:  1. Malignant neoplasm of right breast in female, estrogen receptor positive, unspecified site of breast ([place])   2. Aromatase inhibitor use   3. Anemia in stage 3b chronic kidney disease ([place])   4. Stage  3b chronic kidney disease ([place])    #Right breast invasive carcinoma, grade 2 ER 90%/PR 11-50%/HER-2 negative pT1b pN0 status post right mastectomy. Labs are reviewed and discussed with patient. Continue Arimidex 31m daily.  Baseline elevated CA 27-29 has normalized, and remains stable. '  Bone health Normal bone density in August 2022, Recommend calcium 12085mand vitamin D supplementation. Unilateral left screening mammogram in Sept 2022 was reviewed with patient- normal.  #Anemia of CKD, also alpha thalassemia trait.  Recommend her to take oral ferrous  sulfate 32540mID.   # CKD, Avoid nephrotoxins. 08/27/18 SPEP ordered by nephrology was negative for M protein.   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  All questions were answered. The patient knows to call the clinic with any problems questions or concerns.   Return of visit:  6 months.   ZhoEarlie ServerD, PhD 03/10/2021

## 2021-04-05 ENCOUNTER — Other Ambulatory Visit: Payer: Self-pay | Admitting: Family

## 2021-04-07 ENCOUNTER — Ambulatory Visit: Payer: Medicare Other | Admitting: Family

## 2021-04-21 ENCOUNTER — Encounter: Payer: Self-pay | Admitting: Family

## 2021-04-21 ENCOUNTER — Other Ambulatory Visit: Payer: Self-pay

## 2021-04-21 ENCOUNTER — Ambulatory Visit (INDEPENDENT_AMBULATORY_CARE_PROVIDER_SITE_OTHER): Payer: Medicare Other | Admitting: Family

## 2021-04-21 VITALS — BP 132/74 | HR 72 | Temp 97.3°F | Ht 61.0 in | Wt 141.6 lb

## 2021-04-21 DIAGNOSIS — E119 Type 2 diabetes mellitus without complications: Secondary | ICD-10-CM | POA: Diagnosis not present

## 2021-04-21 DIAGNOSIS — I10 Essential (primary) hypertension: Secondary | ICD-10-CM | POA: Diagnosis not present

## 2021-04-21 DIAGNOSIS — E785 Hyperlipidemia, unspecified: Secondary | ICD-10-CM

## 2021-04-21 DIAGNOSIS — E1169 Type 2 diabetes mellitus with other specified complication: Secondary | ICD-10-CM | POA: Diagnosis not present

## 2021-04-21 DIAGNOSIS — Z23 Encounter for immunization: Secondary | ICD-10-CM

## 2021-04-21 MED ORDER — METFORMIN HCL 500 MG PO TABS: 500.0000 mg | ORAL_TABLET | Freq: Every day | ORAL | 2 refills | Status: DC

## 2021-04-21 NOTE — Progress Notes (Signed)
? ?Subjective:  ? ? Patient ID: Tracy Lawrence, female    DOB: [birth year], 81 y.o.   MRN: [id number] ? ?CC: Tracy Lawrence is a 81 y.o. female who presents today for follow up.  ? ?HPI: Accompanied by husband today  ? ?feels well today.  No new complaints ? ?She works out regularly in her son's gym ?Denies exertional chest pain or pressure, numbness or tingling radiating to left arm or jaw, palpitations, dizziness, or shortness of breath.  ? ? ?DM- Compliant metformin '500mg'$  qd ? ?HTN- compliant with losartan 100 mg, hydrochlorothiazide 12.5 mg ? ?HLD- compliant with crestor '40mg'$  ? ?Due pcv20 ? ?Continues to follow with Dr. Bary Lawrence, Dr Tracy Lawrence for history of right breast cancer, status post right mastectomy.  She remains on Arimidex. ? ?History of anemia of CKD, with alpha thalassemia trait.  Continues ferrous sulfate 325 mg twice daily ? ?CKD-he is following with nephrology, Dr. Holley Raring, last seen 02/22/2021 ? ?HISTORY:  ?Past Medical History:  ?Diagnosis Date  ? Anemia   ? Arthritis   ? Breast cancer ([place]) 2002  ? right breast  ? Breast cancer [facility]) 2011  ? left breast  ? Cancer ([place])   ? Family history of breast cancer   ? Family history of lung cancer   ? Family history of non-Hodgkin's lymphoma   ? Family history of prostate cancer   ? Hypertension   ? Malignant neoplasm of right breast ([place])   ? Personal history of radiation therapy 2002, 2011  ? right and left breast  ? Pre-diabetes   ? ?Past Surgical History:  ?Procedure Laterality Date  ? ABDOMINAL HYSTERECTOMY  2018  ? BREAST BIOPSY Right 10/04/2018  ? u/s bx, vision marker, path pending  ? BREAST LUMPECTOMY Right 2002  ? breast ca with rad  ? BREAST LUMPECTOMY Left 2011  ? breast ca with rad  ? BREAST SURGERY  2011,2002  ? CATARACT EXTRACTION  2015  ? COLONOSCOPY    ? COLONOSCOPY WITH PROPOFOL N/A 06/14/2019  ? Procedure: COLONOSCOPY WITH PROPOFOL;  Surgeon: Tracy Landsman, MD;  Location[facility] ENDOSCOPY;  Service: Gastroenterology;  Laterality: N/A;  ?  ESOPHAGOGASTRODUODENOSCOPY (EGD) WITH PROPOFOL N/A 06/14/2019  ? Procedure: ESOPHAGOGASTRODUODENOSCOPY (EGD) WITH PROPOFOL;  Surgeon: Tracy Landsman, MD;  Location: [facility] ENDOSCOPY;  Service: Gastroenterology;  Laterality: N/A;  ? SIMPLE MASTECTOMY WITH AXILLARY SENTINEL NODE BIOPSY Right 01/06/2020  ? Procedure: SIMPLE MASTECTOMY WITH AXILLARY SENTINEL NODE BIOPSY;  Surgeon: Tracy Bellow, MD;  Location: [facility] ORS;  Service: General;  Laterality: Right;  ? ?Family History  ?Problem Relation Age of Onset  ? Heart attack Mother   ? Cancer Father   ? Lung cancer Father   ? Breast cancer Sister 7  ?     dx again 76  ? Heart attack Sister   ? Non-Hodgkin's lymphoma Sister 55  ? Heart attack Sister   ? Dementia Sister 31  ? Breast cancer Paternal Aunt   ? Breast cancer Maternal Aunt   ? Prostate cancer Paternal Grandfather   ?     metastic, d. 46s  ? Colon cancer Neg Hx   ? ? ?Allergies: Patient has no known allergies. ?Current Outpatient Medications on File Prior to Visit  ?Medication Sig Dispense Refill  ? acetaminophen (TYLENOL) 500 MG tablet Take 1,000 mg by mouth every 6 (six) hours as needed for moderate pain or headache.    ? allopurinol (ZYLOPRIM) 100 MG tablet TAKE 1 TABLET(100 MG) BY MOUTH  DAILY 30 tablet 3  ? anastrozole (ARIMIDEX) 1 MG tablet TAKE 1 TABLET(1 MG) BY MOUTH DAILY 90 tablet 2  ? Calcium Carb-Cholecalciferol (CALCIUM 600 + D PO) Take 1 tablet by mouth daily.    ? calcium carbonate (TUMS - DOSED IN MG ELEMENTAL CALCIUM) 500 MG chewable tablet Chew 1,000 mg by mouth daily as needed for indigestion or heartburn.    ? Carboxymethylcellul-Glycerin (LUBRICATING EYE DROPS OP) Place 1 drop into both eyes daily as needed (irritated eyes).    ? escitalopram (LEXAPRO) 10 MG tablet TAKE 1 TABLET BY MOUTH EVERY DAY FOR DEPRESSION 90 tablet 0  ? ferrous sulfate 325 (65 FE) MG tablet Take 1 tablet (325 mg total) by mouth 2 (two) times daily with a meal. 60 tablet 0  ? hydrochlorothiazide (MICROZIDE) 12.5  MG capsule TAKE 1 CAPSULE(12.5 MG) BY MOUTH DAILY 90 capsule 1  ? losartan (COZAAR) 100 MG tablet Take 1 tablet (100 mg total) by mouth daily. 90 tablet 3  ? Multiple Vitamins-Minerals (EYE VITAMINS PO) Take 1 tablet by mouth daily.     ? rosuvastatin (CRESTOR) 40 MG tablet TAKE 1 TABLET BY MOUTH EVERY DAY FOR CHOLESTEROL 90 tablet 0  ? ?No current facility-administered medications on file prior to visit.  ? ? ?Social History  ? ?Tobacco Use  ? Smoking status: Former  ?  Packs/day: 1.00  ?  Years: 12.00  ?  Pack years: 12.00  ?  Types: Cigarettes  ?  Quit date: 03/18/1979  ?  Years since quitting: 42.1  ? Smokeless tobacco: Never  ? Tobacco comments:  ?  quit 40 years ago  ?Vaping Use  ? Vaping Use: Never used  ?Substance Use Topics  ? Alcohol use: Yes  ?  Comment: rarely  ? Drug use: Never  ? ? ?Review of Systems  ?Constitutional:  Negative for chills and fever.  ?Respiratory:  Negative for cough.   ?Cardiovascular:  Negative for chest pain and palpitations.  ?Gastrointestinal:  Negative for nausea and vomiting.  ?   ?Objective:  ?  ?BP 132/74 (BP Location: Left Arm, Patient Position: Sitting, Cuff Size: Normal)   Pulse 72   Temp (!) 97.3 ?F (36.3 ?C) (Oral)   Ht '5\' 1"'$  (1.549 m)   Wt 141 lb 9.6 oz (64.2 kg)   LMP  (LMP Unknown)   SpO2 98%   BMI 26.76 kg/m?  ?BP Readings from Last 3 Encounters:  ?04/21/21 132/74  ?03/10/21 136/68  ?12/07/20 130/68  ? ?Wt Readings from Last 3 Encounters:  ?04/21/21 141 lb 9.6 oz (64.2 kg)  ?03/10/21 142 lb 4.8 oz (64.5 kg)  ?12/07/20 144 lb 9.6 oz (65.6 kg)  ? ? ?Physical Exam ?Vitals reviewed.  ?Constitutional:   ?   Appearance: She is well-developed.  ?Eyes:  ?   Conjunctiva/sclera: Conjunctivae normal.  ?Cardiovascular:  ?   Rate and Rhythm: Normal rate and regular rhythm.  ?   Pulses: Normal pulses.  ?   Heart sounds: Normal heart sounds.  ?Pulmonary:  ?   Effort: Pulmonary effort is normal.  ?   Breath sounds: Normal breath sounds. No wheezing, rhonchi or rales.  ?Skin: ?    General: Skin is warm and dry.  ?Neurological:  ?   Mental Status: She is alert.  ?Psychiatric:     ?   Speech: Speech normal.     ?   Behavior: Behavior normal.     ?   Thought Content: Thought content normal.  ? ? ?   ?  Assessment & Plan:  ? ?Problem List Items Addressed This Visit   ? ?  ? Cardiovascular and Mediastinum  ? Essential hypertension  ?  Chronic, stable. Continue losartan 100 mg, hydrochlorothiazide 12.5 mg ?  ?  ?  ? Endocrine  ? Diabetes mellitus without complication ([place])  ?  Pending a1c. Anticipate good control. Continue metformin '500mg'$  qd ?  ?  ? Relevant Medications  ? metFORMIN (GLUCOPHAGE) 500 MG tablet  ? Other Relevant Orders  ? HgB A1c  ?  ? Other  ? HLD (hyperlipidemia) - Primary  ?  Chronic, anticipate stable. Pending lipid panel. Continue crestor '40mg'$  ?  ?  ? ?Other Visit Diagnoses   ? ? Need for prophylactic vaccination against Streptococcus pneumoniae (pneumococcus)      ? ?  ? ? ? ?I have changed Bjorn Loser. [place]'s metFORMIN. I am also having her maintain her Multiple Vitamins-Minerals (EYE VITAMINS PO), Calcium Carb-Cholecalciferol (CALCIUM 600 + D PO), acetaminophen, calcium carbonate, Carboxymethylcellul-Glycerin (LUBRICATING EYE DROPS OP), losartan, anastrozole, hydrochlorothiazide, rosuvastatin, escitalopram, ferrous sulfate, and allopurinol. ? ? ?Meds ordered this encounter  ?Medications  ? metFORMIN (GLUCOPHAGE) 500 MG tablet  ?  Sig: Take 1 tablet (500 mg total) by mouth daily with breakfast.  ?  Dispense:  180 tablet  ?  Refill:  2  ?  Order Specific Question:   Supervising Provider  ?  Answer:   Crecencio Mc [[id number]]  ? ? ?Return precautions given.  ? ?Risks, benefits, and alternatives of the medications and treatment plan prescribed today were discussed, and patient expressed understanding.  ? ?Education regarding symptom management and diagnosis given to patient on AVS. ? ?Continue to follow with Burnard Hawthorne, FNP for routine health maintenance.  ? ?Georgetta Haber and I agreed with plan.  ? ?Mable Paris, FNP ? ? ?

## 2021-04-21 NOTE — Assessment & Plan Note (Signed)
Chronic, anticipate stable. Pending lipid panel. Continue crestor '40mg'$  ?

## 2021-04-21 NOTE — Assessment & Plan Note (Signed)
Chronic, stable. Continue losartan 100 mg, hydrochlorothiazide 12.5 mg ?

## 2021-04-21 NOTE — Assessment & Plan Note (Signed)
Pending a1c. Anticipate good control. Continue metformin '500mg'$  qd ?

## 2021-05-05 ENCOUNTER — Other Ambulatory Visit: Payer: Medicare Other

## 2021-05-05 ENCOUNTER — Ambulatory Visit (INDEPENDENT_AMBULATORY_CARE_PROVIDER_SITE_OTHER): Payer: Medicare Other | Admitting: *Deleted

## 2021-05-05 DIAGNOSIS — Z23 Encounter for immunization: Secondary | ICD-10-CM | POA: Diagnosis not present

## 2021-05-05 DIAGNOSIS — E119 Type 2 diabetes mellitus without complications: Secondary | ICD-10-CM

## 2021-05-05 DIAGNOSIS — E1169 Type 2 diabetes mellitus with other specified complication: Secondary | ICD-10-CM

## 2021-05-05 NOTE — Addendum Note (Signed)
Addended by: Leeanne Rio on: 05/05/2021 09:51 AM ? ? Modules accepted: Orders ? ?

## 2021-05-05 NOTE — Progress Notes (Signed)
Pt arrived for Pneumovax - 23. Pt tolerated well. No signs or complaints of distress. ?

## 2021-05-17 ENCOUNTER — Other Ambulatory Visit: Payer: Self-pay | Admitting: Family

## 2021-05-19 ENCOUNTER — Other Ambulatory Visit (INDEPENDENT_AMBULATORY_CARE_PROVIDER_SITE_OTHER): Payer: Medicare Other

## 2021-05-19 DIAGNOSIS — E1169 Type 2 diabetes mellitus with other specified complication: Secondary | ICD-10-CM | POA: Diagnosis not present

## 2021-05-19 DIAGNOSIS — E119 Type 2 diabetes mellitus without complications: Secondary | ICD-10-CM | POA: Diagnosis not present

## 2021-05-19 DIAGNOSIS — E785 Hyperlipidemia, unspecified: Secondary | ICD-10-CM

## 2021-05-19 LAB — LIPID PANEL
Cholesterol: 147 mg/dL (ref 0–200)
HDL: 51.3 mg/dL (ref >39.00)
LDL Cholesterol: 77 mg/dL (ref 0–99)
NonHDL: 95.63
Total CHOL/HDL Ratio: 3
Triglycerides: 93 mg/dL (ref 0.0–149.0)
VLDL: 18.6 mg/dL (ref 0.0–40.0)

## 2021-05-19 LAB — HEMOGLOBIN A1C: Hgb A1c MFr Bld: 5.9 % (ref 4.6–6.5)

## 2021-06-03 ENCOUNTER — Other Ambulatory Visit: Payer: Self-pay | Admitting: Family

## 2021-06-03 DIAGNOSIS — I10 Essential (primary) hypertension: Secondary | ICD-10-CM

## 2021-06-12 ENCOUNTER — Other Ambulatory Visit: Payer: Self-pay | Admitting: Family

## 2021-08-15 ENCOUNTER — Other Ambulatory Visit: Payer: Self-pay | Admitting: Family

## 2021-08-20 ENCOUNTER — Telehealth: Payer: Self-pay | Admitting: Family

## 2021-08-20 NOTE — Telephone Encounter (Signed)
Call pt Sch f/u in 6-8 wks

## 2021-08-22 ENCOUNTER — Other Ambulatory Visit: Payer: Self-pay | Admitting: Family

## 2021-08-22 DIAGNOSIS — I10 Essential (primary) hypertension: Secondary | ICD-10-CM

## 2021-08-24 NOTE — Telephone Encounter (Signed)
Pt scheduled 09/22/21.

## 2021-09-07 ENCOUNTER — Inpatient Hospital Stay: Payer: Medicare Other | Attending: Oncology

## 2021-09-07 DIAGNOSIS — C50411 Malignant neoplasm of upper-outer quadrant of right female breast: Secondary | ICD-10-CM | POA: Insufficient documentation

## 2021-09-07 DIAGNOSIS — Z79899 Other long term (current) drug therapy: Secondary | ICD-10-CM | POA: Diagnosis not present

## 2021-09-07 DIAGNOSIS — Z87891 Personal history of nicotine dependence: Secondary | ICD-10-CM | POA: Diagnosis not present

## 2021-09-07 DIAGNOSIS — Z79811 Long term (current) use of aromatase inhibitors: Secondary | ICD-10-CM | POA: Diagnosis not present

## 2021-09-07 DIAGNOSIS — D631 Anemia in chronic kidney disease: Secondary | ICD-10-CM | POA: Insufficient documentation

## 2021-09-07 DIAGNOSIS — N1832 Chronic kidney disease, stage 3b: Secondary | ICD-10-CM | POA: Insufficient documentation

## 2021-09-07 DIAGNOSIS — C50911 Malignant neoplasm of unspecified site of right female breast: Secondary | ICD-10-CM

## 2021-09-07 DIAGNOSIS — Z170 Estrogen receptor positive status [ER+]: Secondary | ICD-10-CM | POA: Diagnosis not present

## 2021-09-07 LAB — CBC WITH DIFFERENTIAL/PLATELET
Abs Immature Granulocytes: 0.04 10*3/uL (ref 0.00–0.07)
Basophils Absolute: 0 10*3/uL (ref 0.0–0.1)
Basophils Relative: 0 %
Eosinophils Absolute: 0.2 10*3/uL (ref 0.0–0.5)
Eosinophils Relative: 2 %
HCT: 31.3 % — ABNORMAL LOW (ref 36.0–46.0)
Hemoglobin: 9.9 g/dL — ABNORMAL LOW (ref 12.0–15.0)
Immature Granulocytes: 1 %
Lymphocytes Relative: 22 %
Lymphs Abs: 1.8 10*3/uL (ref 0.7–4.0)
MCH: 25.9 pg — ABNORMAL LOW (ref 26.0–34.0)
MCHC: 31.6 g/dL (ref 30.0–36.0)
MCV: 81.9 fL (ref 80.0–100.0)
Monocytes Absolute: 0.4 10*3/uL (ref 0.1–1.0)
Monocytes Relative: 5 %
Neutro Abs: 5.6 10*3/uL (ref 1.7–7.7)
Neutrophils Relative %: 70 %
Platelets: 223 10*3/uL (ref 150–400)
RBC: 3.82 MIL/uL — ABNORMAL LOW (ref 3.87–5.11)
RDW: 14.6 % (ref 11.5–15.5)
WBC: 8.1 10*3/uL (ref 4.0–10.5)
nRBC: 0 % (ref 0.0–0.2)

## 2021-09-07 LAB — COMPREHENSIVE METABOLIC PANEL
ALT: 14 U/L (ref 0–44)
AST: 19 U/L (ref 15–41)
Albumin: 3.7 g/dL (ref 3.5–5.0)
Alkaline Phosphatase: 94 U/L (ref 38–126)
Anion gap: 9 (ref 5–15)
BUN: 29 mg/dL — ABNORMAL HIGH (ref 8–23)
CO2: 25 mmol/L (ref 22–32)
Calcium: 9.8 mg/dL (ref 8.9–10.3)
Chloride: 102 mmol/L (ref 98–111)
Creatinine, Ser: 1.37 mg/dL — ABNORMAL HIGH (ref 0.44–1.00)
GFR, Estimated: 39 mL/min — ABNORMAL LOW (ref >60)
Glucose, Bld: 125 mg/dL — ABNORMAL HIGH (ref 70–99)
Potassium: 4.2 mmol/L (ref 3.5–5.1)
Sodium: 136 mmol/L (ref 135–145)
Total Bilirubin: 0.2 mg/dL — ABNORMAL LOW (ref 0.3–1.2)
Total Protein: 7.6 g/dL (ref 6.5–8.1)

## 2021-09-07 LAB — IRON AND TIBC
Iron: 42 ug/dL (ref 28–170)
Saturation Ratios: 17 % (ref 10.4–31.8)
TIBC: 245 ug/dL — ABNORMAL LOW (ref 250–450)
UIBC: 203 ug/dL

## 2021-09-07 LAB — FERRITIN: Ferritin: 221 ng/mL (ref 11–307)

## 2021-09-08 LAB — CANCER ANTIGEN 27.29: CA 27.29: 21.4 U/mL (ref 0.0–38.6)

## 2021-09-09 ENCOUNTER — Inpatient Hospital Stay (HOSPITAL_BASED_OUTPATIENT_CLINIC_OR_DEPARTMENT_OTHER): Payer: Medicare Other | Admitting: Oncology

## 2021-09-09 ENCOUNTER — Encounter: Payer: Self-pay | Admitting: Oncology

## 2021-09-09 VITALS — BP 140/49 | HR 76 | Temp 98.1°F | Ht 61.0 in | Wt 142.0 lb

## 2021-09-09 DIAGNOSIS — Z79811 Long term (current) use of aromatase inhibitors: Secondary | ICD-10-CM

## 2021-09-09 DIAGNOSIS — Z170 Estrogen receptor positive status [ER+]: Secondary | ICD-10-CM

## 2021-09-09 DIAGNOSIS — D631 Anemia in chronic kidney disease: Secondary | ICD-10-CM

## 2021-09-09 DIAGNOSIS — N6310 Unspecified lump in the right breast, unspecified quadrant: Secondary | ICD-10-CM

## 2021-09-09 DIAGNOSIS — C50411 Malignant neoplasm of upper-outer quadrant of right female breast: Secondary | ICD-10-CM | POA: Diagnosis not present

## 2021-09-09 DIAGNOSIS — N1832 Chronic kidney disease, stage 3b: Secondary | ICD-10-CM

## 2021-09-09 DIAGNOSIS — Z1231 Encounter for screening mammogram for malignant neoplasm of breast: Secondary | ICD-10-CM

## 2021-09-09 MED ORDER — ANASTROZOLE 1 MG PO TABS: 1.0000 mg | ORAL_TABLET | Freq: Every day | ORAL | 1 refills | Status: DC

## 2021-09-10 LAB — HM DIABETES EYE EXAM

## 2021-09-11 NOTE — Progress Notes (Signed)
Hematology/Oncology Progress note [phone] [phone]      Patient Care Team: Burnard Hawthorne, FNP as PCP - General (Family Medicine) Theodore Demark, RN (Inactive) as Oncology Nurse Navigator (Oncology) Noreene Filbert, MD as Radiation Oncologist (Radiation Oncology)  REFERRING PROVIDER: Burnard Hawthorne, FNP  CHIEF COMPLAINTS/REASON FOR VISIT:  Follow up  for breast cancer  PERTINENT ONCOLOGY HISTORY Tracy Lawrence is a  81 y.o.  female with PMH listed below was seen in consultation at the request of  Burnard Hawthorne, FNP  for evaluation of abnormal SPEP. Reviewed patient's previous blood work via care everywhere. 08/27/2018, free light chain ratio 1.97, free kappa 79.5, free lambda 40.3. Protein electrophoresis showed increased alpha globulin.  #Patient moved from [place] to [place] last year. She reports history of right breast cancer in 2002 and left breast cancer in 2011.  She underwent right breast lumpectomy followed by adjuvant radiation followed by 5 years of tamoxifen.  Underwent left lumpectomy followed by adjuvant radiation followed by aromatase inhibitor for 5 years.  Currently she is not on any antiestrogen treatment.  Denies any chemotherapy treatment history.  Both breast cancer treatments were done in [place]. Obtained records from [facility] were reviewed.  No pathology is available. 06/06/2017 screening mammogram showed postoperative changes in both breasts.  Architectural distortion has not changed significantly.  Skin thickening is similar.  Calcifications have increased mildly around the postoperative sites bilaterally.  No new dominant mass.   # Chronic history of kidney insufficiency, stage IIIb.  Patient sees Dr. Holley Raring.  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  # Breast exam was performed in seated and lying down  position. Right breast lower inner quadrant lumpectomy scar with focal scar tissue. Left breast upper outer quadrant, 1:00 lumpectomy scar with palpable round thickened breast tissue. No palpable axillary lymphadenopathy.  # #Family history of breast cancer in first-degree relatives, patient reports that she was previously tested negative for BRCA gene mutation.  Results were not available. Patient was referred to genetic counselor and 11/19/2019 [facility] Breast Cancer STAT Panel + Multi- cancer panel found no pathogenic mutations.   # 09/17/2019 bilateral screening mammogram showed right breast mass which warrants further evaluation.  No suspicious findings in left breast. 09/25/2019, right diagnostic breast mammogram showed specialist right breast mass at 10:00. Right axilla is negative for adenopathy.  Patient underwent ultrasound-guided core biopsy of the right breast 10:00 location. 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  Grade 2, no DCIS or LVI.  ER 90%, PR 11-50%, HER-2 negative.  Chronic microcytic anemia, iron panel is not consistent with iron deficiency. Normal hemoglobin evaluation.  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 Hemoglobin stable at 11.6. GI work-up includes EGD and colonoscopy on 06/14/2019.  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  Benign colon polyps.  No high-grade dysplasia or malignancy.  She follows up with Dr. Marius Ditch Anemia can be also secondary to CKD.  # #01/06/2020 patient underwent right mastectomy and sentinel lymph node biopsy.  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  No adjuvant chemotherapy will be offered She had mastectomy with negative margin, no need for adjuvant radiation. Baseline elevated CA 27.29--> post operation  normalization of CEA  27.29 February 2020, started on Arimidex She was seen by Dr. Bary Castilla 04/02/2020 for seroma.  #INTERVAL HISTORY Tracy Lawrence is a 81 y.o. female who has above history reviewed by me today presents for follow up visit for breast cancer.  Patient has no new complaints.  She has manageable side effects from Arimidex. + Joint pain.    Review of Systems  Constitutional:  Negative for appetite change, chills, fatigue and fever.  HENT:   Negative for hearing loss and voice change.   Eyes:  Negative for eye problems.  Respiratory:  Negative for chest tightness and cough.   Cardiovascular:  Negative for chest pain.  Gastrointestinal:  Negative for abdominal distention, abdominal pain and blood in stool.  Endocrine: Positive for hot flashes.  Genitourinary:  Negative for difficulty urinating and frequency.   Musculoskeletal:  Positive for arthralgias.  Skin:  Negative for itching and rash.  Neurological:  Negative for extremity weakness.  Hematological:  Negative for adenopathy.  Psychiatric/Behavioral:  Negative for confusion.    Right mastectomy MEDICAL HISTORY:  Past Medical History:  Diagnosis Date   Anemia    Arthritis    Breast cancer ([place]) 2002   right breast   Breast cancer ([place]) 2011   left breast   Cancer ([place])    Family history of breast cancer    Family history of lung cancer    Family history of non-Hodgkin's lymphoma    Family history of prostate cancer    Hypertension    Malignant neoplasm of right breast [facility])    Personal history of radiation therapy 2002, 2011   right and left breast   Pre-diabetes     SURGICAL HISTORY: Past Surgical History:  Procedure Laterality Date   ABDOMINAL HYSTERECTOMY  2018   BREAST BIOPSY Right 10/04/2018   u/s bx, vision marker, path pending   BREAST LUMPECTOMY Right 2002   breast ca with rad   BREAST LUMPECTOMY Left 2011   breast ca with rad   BREAST SURGERY  2011,2002   CATARACT EXTRACTION  2015    COLONOSCOPY     COLONOSCOPY WITH PROPOFOL N/A 06/14/2019   Procedure: COLONOSCOPY WITH PROPOFOL;  Surgeon: Lin Landsman, MD;  Location: [facility] ENDOSCOPY;  Service: Gastroenterology;  Laterality: N/A;   ESOPHAGOGASTRODUODENOSCOPY (EGD) WITH PROPOFOL N/A 06/14/2019   Procedure: ESOPHAGOGASTRODUODENOSCOPY (EGD) WITH PROPOFOL;  Surgeon: Lin Landsman, MD;  Location: [facility] ENDOSCOPY;  Service: Gastroenterology;  Laterality: N/A;   SIMPLE MASTECTOMY WITH AXILLARY SENTINEL NODE BIOPSY Right 01/06/2020   Procedure: SIMPLE MASTECTOMY WITH AXILLARY SENTINEL NODE BIOPSY;  Surgeon: Robert Bellow, MD;  Location: [facility] ORS;  Service: General;  Laterality: Right;    SOCIAL HISTORY: Social History   Socioeconomic History   Marital status: Married    Spouse name: Not on file   Number of children: Not on file   Years of education: Not on file   Highest education level: Not on file  Occupational History   Not on file  Tobacco Use   Smoking status: Former    Packs/day: 1.00    Years: 12.00    Total pack years: 12.00    Types: Cigarettes    Quit date: 03/18/1979    Years since quitting: 42.5   Smokeless tobacco: Never   Tobacco comments:    quit 40 years ago  Vaping Use   Vaping Use: Never used  Substance and Sexual Activity   Alcohol use: Yes    Comment: rarely   Drug use:  Never   Sexual activity: Not on file  Other Topics Concern   Not on file  Social History Narrative   From [place]      Retired      Social Determinants of Health   Financial Resource Strain: [place]  (10/28/2020)   Overall Financial Resource Strain (CARDIA)    Difficulty of Paying Living Expenses: Not hard at all  Food Insecurity: No Food Insecurity (10/28/2020)   Hunger Vital Sign    Worried About Running Out of Food in the Last Year: Never true    Ran Out of Food in the Last Year: Never true  Transportation Needs: No Transportation Needs (10/28/2020)   PRAPARE - Radiographer, therapeutic (Medical): No    Lack of Transportation (Non-Medical): No  Physical Activity: Unknown (10/28/2020)   Exercise Vital Sign    Days of Exercise per Week: 0 days    Minutes of Exercise per Session: Not on file  Stress: No Stress Concern Present (10/28/2020)   [place]    Feeling of Stress : Not at all  Social Connections: Unknown (10/28/2020)   Social Connection and Isolation Panel [NHANES]    Frequency of Communication with Friends and Family: Not on file    Frequency of Social Gatherings with Friends and Family: Not on file    Attends Religious Services: Not on file    Active Member of Clubs or Organizations: Not on file    Attends Archivist Meetings: Not on file    Marital Status: Married  Intimate Partner Violence: Not At Risk (10/28/2020)   Humiliation, Afraid, Rape, and Kick questionnaire    Fear of Current or Ex-Partner: No    Emotionally Abused: No    Physically Abused: No    Sexually Abused: No    FAMILY HISTORY: Family History  Problem Relation Age of Onset   Heart attack Mother    Cancer Father    Lung cancer Father    Breast cancer Sister 4       dx again 4   Heart attack Sister    Non-Hodgkin's lymphoma Sister 70   Heart attack Sister    Dementia Sister 34   Breast cancer Paternal Aunt    Breast cancer Maternal Aunt    Prostate cancer Paternal Grandfather        metastic, d. 27s   Colon cancer Neg Hx     ALLERGIES:  has No Known Allergies.  MEDICATIONS:  Current Outpatient Medications  Medication Sig Dispense Refill   acetaminophen (TYLENOL) 500 MG tablet Take 1,000 mg by mouth every 6 (six) hours as needed for moderate pain or headache.     allopurinol (ZYLOPRIM) 100 MG tablet TAKE 1 TABLET(100 MG) BY MOUTH DAILY 30 tablet 3   Calcium Carb-Cholecalciferol (CALCIUM 600 + D PO) Take 1 tablet by mouth daily.     calcium carbonate (TUMS - DOSED IN MG ELEMENTAL CALCIUM)  500 MG chewable tablet Chew 1,000 mg by mouth daily as needed for indigestion or heartburn.     Carboxymethylcellul-Glycerin (LUBRICATING EYE DROPS OP) Place 1 drop into both eyes daily as needed (irritated eyes).     escitalopram (LEXAPRO) 10 MG tablet TAKE 1 TABLET BY MOUTH EVERY DAY FOR DEPRESSION 90 tablet 0   ferrous sulfate 325 (65 FE) MG tablet Take 1 tablet (325 mg total) by mouth 2 (two) times daily with a meal. 60 tablet 0   hydrochlorothiazide (  MICROZIDE) 12.5 MG capsule TAKE 1 CAPSULE(12.5 MG) BY MOUTH DAILY 90 capsule 1   losartan (COZAAR) 100 MG tablet TAKE 1 TABLET(100 MG) BY MOUTH DAILY 90 tablet 3   metFORMIN (GLUCOPHAGE) 500 MG tablet Take 1 tablet (500 mg total) by mouth daily with breakfast. 180 tablet 2   Multiple Vitamins-Minerals (EYE VITAMINS PO) Take 1 tablet by mouth daily.      rosuvastatin (CRESTOR) 40 MG tablet TAKE 1 TABLET BY MOUTH EVERY DAY FOR CHOLESTEROL 90 tablet 0   anastrozole (ARIMIDEX) 1 MG tablet Take 1 tablet (1 mg total) by mouth daily. 90 tablet 1   No current facility-administered medications for this visit.     PHYSICAL EXAMINATION: ECOG PERFORMANCE STATUS: 0 - Asymptomatic Vitals:   09/09/21 1352  BP: (!) 140/49  Pulse: 76  Temp: 98.1 F (36.7 C)   Filed Weights   09/09/21 1352  Weight: 142 lb (64.4 kg)    Physical Exam Constitutional:      General: She is not in acute distress. HENT:     Head: Normocephalic and atraumatic.  Eyes:     General: No scleral icterus. Cardiovascular:     Rate and Rhythm: Normal rate and regular rhythm.     Heart sounds: Normal heart sounds.  Pulmonary:     Effort: Pulmonary effort is normal. No respiratory distress.     Breath sounds: No wheezing.  Abdominal:     General: Bowel sounds are normal. There is no distension.     Palpations: Abdomen is soft.  Musculoskeletal:        General: No deformity. Normal range of motion.     Cervical back: Normal range of motion and neck supple.  Skin:     General: Skin is warm and dry.     Findings: No erythema or rash.  Neurological:     Mental Status: She is alert and oriented to person, place, and time. Mental status is at baseline.     Cranial Nerves: No cranial nerve deficit.     Coordination: Coordination normal.  Psychiatric:        Mood and Affect: Mood normal.      LABORATORY DATA:  I have reviewed the data as listed Lab Results  Component Value Date   WBC 8.1 09/07/2021   HGB 9.9 (L) 09/07/2021   HCT 31.3 (L) 09/07/2021   MCV 81.9 09/07/2021   PLT 223 09/07/2021   Recent Labs    10/29/20 1058 12/14/20 1049 03/08/21 1255 09/07/21 1104  NA 133* 132* 134* 136  K 4.3 4.7 3.6 4.2  CL 99 96 98 102  CO2 '25 29 28 25  ' GLUCOSE 157* 95 122* 125*  BUN 34* 27* 27* 29*  CREATININE 1.72* 1.47* 1.30* 1.37*  CALCIUM 9.6 10.1 9.3 9.8  GFRNONAA 30*  --  42* 39*  PROT 7.9  --  7.1 7.6  ALBUMIN 4.3  --  3.8 3.7  AST 19  --  17 19  ALT 15  --  12 14  ALKPHOS 85  --  74 94  BILITOT 0.6  --  0.2* 0.2*    Iron/TIBC/Ferritin/ %Sat    Component Value Date/Time   IRON 42 09/07/2021 1104   TIBC 245 (L) 09/07/2021 1104   FERRITIN 221 09/07/2021 1104   IRONPCTSAT 17 09/07/2021 1104      RADIOGRAPHIC STUDIES: I have personally reviewed the radiological images as listed and agreed with the findings in the report. No results found.  ASSESSMENT & PLAN:  1. Malignant neoplasm of upper-outer quadrant of right breast in female, estrogen receptor positive ([place])   2. Aromatase inhibitor use   3. Anemia in stage 3b chronic kidney disease (HCC)   4. Stage 3b chronic kidney disease (HCC)    #Right breast invasive carcinoma, grade 2 ER 90%/PR 11-50%/HER-2 negative pT1b pN0 status post right mastectomy. Reviewed and discussed with patient.  Continue Arimidex 1 mg daily. CA 27-29 remains stable.  Obtain annual unilateral left breast mammogram.  Bone health Normal bone density in August 2022, continue calcium 1224m and  vitamin D supplementation.   #Anemia of CKD, also alpha thalassemia trait.  Hemoglobin is lower than her baseline. Continue oral ferrous sulfate 3249mBID.   # CKD, Avoid nephrotoxins. 08/27/18 SPEP ordered by nephrology was negative for M protein.   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  All questions were answered. The patient knows to call the clinic with any problems questions or concerns.   Return of visit:  6 months.   ZhEarlie ServerMD, PhD 09/11/2021

## 2021-09-16 ENCOUNTER — Other Ambulatory Visit: Payer: Self-pay | Admitting: Family

## 2021-09-22 ENCOUNTER — Ambulatory Visit (INDEPENDENT_AMBULATORY_CARE_PROVIDER_SITE_OTHER): Payer: Medicare Other | Admitting: Family

## 2021-09-22 ENCOUNTER — Encounter: Payer: Self-pay | Admitting: Family

## 2021-09-22 VITALS — BP 132/70 | HR 77 | Temp 98.2°F | Ht 61.0 in | Wt 145.0 lb

## 2021-09-22 DIAGNOSIS — E119 Type 2 diabetes mellitus without complications: Secondary | ICD-10-CM | POA: Diagnosis not present

## 2021-09-22 DIAGNOSIS — I10 Essential (primary) hypertension: Secondary | ICD-10-CM | POA: Diagnosis not present

## 2021-09-22 LAB — POCT GLYCOSYLATED HEMOGLOBIN (HGB A1C): Hemoglobin A1C: 6 % — AB (ref 4.0–5.6)

## 2021-09-22 NOTE — Assessment & Plan Note (Addendum)
Lab Results Component Value Date  HGBA1C 5.9 05/19/2021 Exceptionally well controlled.  We discussed metformin 500 mg however she has chronic kidney disease and is currently managing diabetes quite with  diet.  We will do a trial stop the metformin and continue to watch her A1c .

## 2021-09-22 NOTE — Assessment & Plan Note (Signed)
Chronic, stable.  Continue  hydrochlorothiazide 12.5 mg, losartan 100 mg

## 2021-09-22 NOTE — Progress Notes (Signed)
Subjective:    Patient ID: Tracy Lawrence, female    DOB: Oct 11, 1940, 81 y.o.   MRN: [id number]  CC: Tracy Lawrence is a 81 y.o. female who presents today for follow up.   HPI: She is excercises twice per week.  She is eating smaller portions and trying to lose weight.  She would like to trial being off of the metformin  HTN-she is compliant with hydrochlorothiazide 12.5 mg, losartan 100 mg    DM-compliant metformin 500 mg daily .   she had recent follow-up with Dr. Tasia Catchings for history of right breast cancer ,anemia of CKD, 09/09/2021.  She is compliant with Arimidex 1 mg daily, ferrous sulfate 325 twice daily She continues to follow with nephrology, Dr. Holley Raring.  Last seen 06/22/2021.  She will be maintained on losartan for renal protection. HISTORY:  Past Medical History:  Diagnosis Date   Anemia    Arthritis    Breast cancer ([place]) 2002   right breast   Breast cancer ([place]) 2011   left breast   Cancer ([place])    Family history of breast cancer    Family history of lung cancer    Family history of non-Hodgkin's lymphoma    Family history of prostate cancer    Hypertension    Malignant neoplasm of right breast [facility])    Personal history of radiation therapy 2002, 2011   right and left breast   Pre-diabetes    Past Surgical History:  Procedure Laterality Date   ABDOMINAL HYSTERECTOMY  2018   BREAST BIOPSY Right 10/04/2018   u/s bx, vision marker, path pending   BREAST LUMPECTOMY Right 2002   breast ca with rad   BREAST LUMPECTOMY Left 2011   breast ca with rad   BREAST SURGERY  2011,2002   CATARACT EXTRACTION  2015   COLONOSCOPY     COLONOSCOPY WITH PROPOFOL N/A 06/14/2019   Procedure: COLONOSCOPY WITH PROPOFOL;  Surgeon: Lin Landsman, MD;  Location: [facility] ENDOSCOPY;  Service: Gastroenterology;  Laterality: N/A;   ESOPHAGOGASTRODUODENOSCOPY (EGD) WITH PROPOFOL N/A 06/14/2019   Procedure: ESOPHAGOGASTRODUODENOSCOPY (EGD) WITH PROPOFOL;  Surgeon: Lin Landsman, MD;   Location: [facility] ENDOSCOPY;  Service: Gastroenterology;  Laterality: N/A;   SIMPLE MASTECTOMY WITH AXILLARY SENTINEL NODE BIOPSY Right 01/06/2020   Procedure: SIMPLE MASTECTOMY WITH AXILLARY SENTINEL NODE BIOPSY;  Surgeon: Robert Bellow, MD;  Location: [facility] ORS;  Service: General;  Laterality: Right;   Family History  Problem Relation Age of Onset   Heart attack Mother    Cancer Father    Lung cancer Father    Breast cancer Sister 75       dx again 70   Heart attack Sister    Non-Hodgkin's lymphoma Sister 62   Heart attack Sister    Dementia Sister 47   Breast cancer Paternal Aunt    Breast cancer Maternal Aunt    Prostate cancer Paternal Grandfather        metastic, d. 64s   Colon cancer Neg Hx     Allergies: Patient has no known allergies. Current Outpatient Medications on File Prior to Visit  Medication Sig Dispense Refill   acetaminophen (TYLENOL) 500 MG tablet Take 1,000 mg by mouth every 6 (six) hours as needed for moderate pain or headache.     allopurinol (ZYLOPRIM) 100 MG tablet TAKE 1 TABLET(100 MG) BY MOUTH DAILY 30 tablet 3   anastrozole (ARIMIDEX) 1 MG tablet Take 1 tablet (1 mg total) by mouth daily. Charles  tablet 1   Calcium Carb-Cholecalciferol (CALCIUM 600 + D PO) Take 1 tablet by mouth daily.     calcium carbonate (TUMS - DOSED IN MG ELEMENTAL CALCIUM) 500 MG chewable tablet Chew 1,000 mg by mouth daily as needed for indigestion or heartburn.     Carboxymethylcellul-Glycerin (LUBRICATING EYE DROPS OP) Place 1 drop into both eyes daily as needed (irritated eyes).     escitalopram (LEXAPRO) 10 MG tablet TAKE 1 TABLET BY MOUTH EVERY DAY FOR DEPRESSION 90 tablet 0   ferrous sulfate 325 (65 FE) MG tablet Take 1 tablet (325 mg total) by mouth 2 (two) times daily with a meal. 60 tablet 0   hydrochlorothiazide (MICROZIDE) 12.5 MG capsule TAKE 1 CAPSULE(12.5 MG) BY MOUTH DAILY 90 capsule 1   losartan (COZAAR) 100 MG tablet TAKE 1 TABLET(100 MG) BY MOUTH DAILY 90 tablet 3    Multiple Vitamins-Minerals (EYE VITAMINS PO) Take 1 tablet by mouth daily.      rosuvastatin (CRESTOR) 40 MG tablet TAKE 1 TABLET BY MOUTH EVERY DAY FOR CHOLESTEROL 90 tablet 0   No current facility-administered medications on file prior to visit.    Social History   Tobacco Use   Smoking status: Former    Packs/day: 1.00    Years: 12.00    Total pack years: 12.00    Types: Cigarettes    Quit date: 03/18/1979    Years since quitting: 42.5   Smokeless tobacco: Never   Tobacco comments:    quit 40 years ago  Vaping Use   Vaping Use: Never used  Substance Use Topics   Alcohol use: Yes    Comment: rarely   Drug use: Never    Review of Systems  Constitutional:  Negative for chills and fever.  Respiratory:  Negative for cough.   Cardiovascular:  Negative for chest pain and palpitations.  Gastrointestinal:  Negative for nausea and vomiting.      Objective:    BP 132/70 (BP Location: Left Arm, Patient Position: Sitting, Cuff Size: Normal)   Pulse 77   Temp 98.2 F (36.8 C) (Oral)   Ht '5\' 1"'$  (1.549 m)   Wt 145 lb (65.8 kg)   LMP  (LMP Unknown)   SpO2 98%   BMI 27.40 kg/m  BP Readings from Last 3 Encounters:  09/22/21 132/70  09/09/21 (!) 140/49  04/21/21 132/74   Wt Readings from Last 3 Encounters:  09/22/21 145 lb (65.8 kg)  09/09/21 142 lb (64.4 kg)  04/21/21 141 lb 9.6 oz (64.2 kg)    Physical Exam Vitals reviewed.  Constitutional:      Appearance: She is well-developed.  Eyes:     Conjunctiva/sclera: Conjunctivae normal.  Cardiovascular:     Rate and Rhythm: Normal rate and regular rhythm.     Pulses: Normal pulses.     Heart sounds: Normal heart sounds.  Pulmonary:     Effort: Pulmonary effort is normal.     Breath sounds: Normal breath sounds. No wheezing, rhonchi or rales.  Skin:    General: Skin is warm and dry.  Neurological:     Mental Status: She is alert.  Psychiatric:        Speech: Speech normal.        Behavior: Behavior normal.         Thought Content: Thought content normal.        Assessment & Plan:   Problem List Items Addressed This Visit       Cardiovascular and Mediastinum  Essential hypertension    Chronic, stable.  Continue  hydrochlorothiazide 12.5 mg, losartan 100 mg         Endocrine   Diabetes mellitus without complication ([place]) - Primary    Lab Results Component Value Date  HGBA1C 5.9 05/19/2021 Exceptionally well controlled.  We discussed metformin 500 mg however she has chronic kidney disease and is currently managing diabetes quite with  diet.  We will do a trial stop the metformin and continue to watch her A1c .       Relevant Orders   POCT HgB A1C (Completed)     I have discontinued Bjorn Loser. Emmer's metFORMIN. I am also having her maintain her Multiple Vitamins-Minerals (EYE VITAMINS PO), Calcium Carb-Cholecalciferol (CALCIUM 600 + D PO), acetaminophen, calcium carbonate, Carboxymethylcellul-Glycerin (LUBRICATING EYE DROPS OP), ferrous sulfate, allopurinol, losartan, rosuvastatin, hydrochlorothiazide, anastrozole, and escitalopram.   No orders of the defined types were placed in this encounter.   Return precautions given.   Risks, benefits, and alternatives of the medications and treatment plan prescribed today were discussed, and patient expressed understanding.   Education regarding symptom management and diagnosis given to patient on AVS.  Continue to follow with Burnard Hawthorne, FNP for routine health maintenance.   Georgetta Haber and I agreed with plan.   Mable Paris, FNP

## 2021-09-22 NOTE — Patient Instructions (Signed)
Stop metformin as I do not think that you need any kidney disease, I think we should be careful when taking metformin.  Nice to see you,always   This is  Dr. Lupita Dawn  example of a  "Low GI"  Diet:  It will allow you to lose 4 to 8  lbs  per month if you follow it carefully.  Your goal with exercise is a minimum of 30 minutes of aerobic exercise 5 days per week (Walking does not count once it becomes easy!)    All of the foods can be found at grocery stores and in bulk at [employer].  The Atkins protein bars and shakes are available in more varieties at [employer], [employer] and [employer].     7 AM Breakfast:  Choose from the following:  Low carbohydrate Protein  Shakes (I recommend the  Premier Protein chocolate shakes,  EAS AdvantEdge "Carb Control" shakes  Or the Atkins shakes all are under 3 net carbs)     a scrambled egg/bacon/cheese burrito made with Mission's "carb balance" whole wheat tortilla  (about 10 net carbs )  Regulatory affairs officer (basically a quiche without the pastry crust) that is eaten cold and very convenient way to get your eggs.  8 carbs)  If you make your own protein shakes, avoid bananas and pineapple,  And use low carb greek yogurt or original /unsweetened almond or soy milk    Avoid cereal and bananas, oatmeal and cream of wheat and grits. They are loaded with carbohydrates!   10 AM: high protein snack:  Protein bar by Atkins (the snack size, under 200 cal, usually < 6 net carbs).    A stick of cheese:  Around 1 carb,  100 cal     Dannon Light n Fit [place] Yogurt  (80 cal, 8 carbs)  Other so called "protein bars" and Greek yogurts tend to be loaded with carbohydrates.  Remember, in food advertising, the word "energy" is synonymous for " carbohydrate."  Lunch:   A Sandwich using the bread choices listed, Can use any  Eggs,  lunchmeat, grilled meat or canned tuna), avocado, regular mayo/mustard  and cheese.  A Salad using blue cheese, ranch,  Goddess  or vinagrette,  Avoid taco shells, croutons or "confetti" and no "candied nuts" but regular nuts OK.   No pretzels, nabs  or chips.  Pickles and miniature sweet peppers are a good low carb alternative that provide a "crunch"  The bread is the only source of carbohydrate in a sandwich and  can be decreased by trying some of the attached alternatives to traditional loaf bread   Avoid "Low fat dressings, as well as [place] dressings They are loaded with sugar!   3 PM/ Mid day  Snack:  Consider  1 ounce of  almonds, walnuts, pistachios, pecans, peanuts,  Macadamia nuts or a nut medley.  Avoid "granola and granola bars "  Mixed nuts are ok in moderation as long as there are no raisins,  cranberries or dried fruit.   KIND bars are OK if you get the low glycemic index variety   Try the prosciutto/mozzarella cheese sticks by [employer]  In deli /backery section   High protein      6 PM  Dinner:     Meat/fowl/fish with a green salad, and either broccoli, cauliflower, green beans, spinach, brussel sprouts or  Lima beans. DO NOT BREAD THE PROTEIN!!      There is  a low carb pasta by Dreamfield's that is acceptable and tastes great: only 5 digestible carbs/serving.( All grocery stores but BJs carry it ) Several ready made meals are available low carb:   Try Michel Angelo's chicken piccata or chicken or eggplant parm over low carb pasta.(Lowes and BJs)   Marjory Lies Sanchez's "Carnitas" (pulled pork, no sauce,  0 carbs) or his beef pot roast to make a dinner burrito (at [employer]J's)  Pesto over low carb pasta (bj's sells a good quality pesto in the center refrigerated section of the deli   Try satueeing  Cheral Marker with mushroooms as a good side   [employer] Giant makes a mashed cauliflower that tastes like mashed potatoes  Whole wheat pasta is still full of digestible carbs and  Not as low in glycemic index as Dreamfield's.   Brown rice is still rice,  So skip the rice and noodles if you eat [place] or  [place] (or at least limit to 1/2 cup)  9 PM snack :   Breyer's "low carb" fudgsicle or  ice cream bar (Carb Smart line), or  Weight Watcher's ice cream bar , or another "no sugar added" ice cream;  a serving of fresh berries/cherries with whipped cream   Cheese or DANNON'S LlGHT N FIT GREEK YOGURT  8 ounces of [place] unsweetened almond/cococunut milk    Treat yourself to a parfait made with whipped cream blueberiies, walnuts and vanilla greek yogurt  Avoid bananas, pineapple, grapes  and watermelon on a regular basis because they are high in sugar.  THINK OF THEM AS DESSERT  Remember that snack Substitutions should be less than 10 NET carbs per serving and meals < 20 carbs. Remember to subtract fiber grams to get the "net carbs."

## 2021-09-30 NOTE — Progress Notes (Signed)
abstract

## 2021-10-11 ENCOUNTER — Encounter: Payer: Self-pay | Admitting: Family Medicine

## 2021-10-11 ENCOUNTER — Ambulatory Visit (INDEPENDENT_AMBULATORY_CARE_PROVIDER_SITE_OTHER): Payer: Medicare Other | Admitting: Family Medicine

## 2021-10-11 VITALS — BP 136/70 | HR 81 | Temp 98.1°F | Ht 61.0 in | Wt 144.2 lb

## 2021-10-11 DIAGNOSIS — L02212 Cutaneous abscess of back [any part, except buttock]: Secondary | ICD-10-CM | POA: Diagnosis not present

## 2021-10-11 MED ORDER — DOXYCYCLINE HYCLATE 100 MG PO TABS: 100.0000 mg | ORAL_TABLET | Freq: Two times a day (BID) | ORAL | 0 refills | Status: AC

## 2021-10-11 NOTE — Progress Notes (Signed)
    SUBJECTIVE:   CHIEF COMPLAINT / HPI: infected cyst  Patient presents to clinic with husband with concern for infection of cyst in back.  Has had similar symptoms in past and had drained and removed x2.  Reports cyst getting bigger about 1 month ago.  Was the size of an egg.  Has dermatology appointment scheduled for 09/20.  Started to drain so she punctured it to drain more.  Husband reports he expressed a lot of thick green fluid from area and both are concern for infection.  Denies any fever.  Is painful.    PERTINENT  PMH / PSH:  Breast Ca Previous   OBJECTIVE:   BP 136/70 (BP Location: Left Arm, Patient Position: Sitting, Cuff Size: Large)   Pulse 81   Temp 98.1 F (36.7 C) (Oral)   Ht '5\' 1"'$  (1.549 m)   Wt 144 lb 3.2 oz (65.4 kg)   LMP  (LMP Unknown)   SpO2 99%   BMI 27.25 kg/m    General: Alert, no acute distress Derm: large erythematous fluctuating mass located in thoracic area posteriorly.  Small puncture area at base, with small amount of sanguinous drainage noted on old dressing.   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   -Verbal consent obtained for expression of  Material.  Area cleansed with alcohol and betadine. Expressed large amount of thick yellow/green discharge. Area remains fluctuant after expression.  Was unable to continue given pain.  Procedure stopped.  Was able to express enough material to provide some relief.  Area cleansed and covered with nonadherent dressing. -Recommend warm compresses frequently -Culture wound sent -Doxycycline 100 mg BID x 10 days -Follow up with Dermatology as scheduled.  -Strict return precautions provided. -Follow up in 1 week with PCP to ensure healing    PDMP Reviewed  Carollee Leitz, MD

## 2021-10-11 NOTE — Patient Instructions (Signed)
It was a pleasure meeting you today. Thank you for allowing me to take part in your health care.  Our goals for today as we discussed include:  For your back wound Start Doxycycline 100 mg twice a day for 10 days Take probiotics daily while on antibiotics and for 14 days after If you develop any fevers, worsening pain, swelling or redness pleas notify MD Will notify you if needing to switch antibiotics once results from swabs received. Use warm compresses frequently to keep area draining while on antibiotics. Follow up with Dermatology as scheduled on Sept 20.     Please follow-up with PCP in 1 week  If you have any questions or concerns, please do not hesitate to call the office at [phone].  I look forward to our next visit and until then take care and stay safe.  Regards,   Carollee Leitz, MD   [facility]

## 2021-10-14 LAB — WOUND CULTURE
MICRO NUMBER:: 13899122
SPECIMEN QUALITY:: ADEQUATE

## 2021-10-15 ENCOUNTER — Ambulatory Visit
Admission: RE | Admit: 2021-10-15 | Discharge: 2021-10-15 | Disposition: A | Discharge status: Home / Self Care | Payer: Medicare Other | Source: Ambulatory Visit | Attending: Oncology | Admitting: Oncology

## 2021-10-15 DIAGNOSIS — Z170 Estrogen receptor positive status [ER+]: Secondary | ICD-10-CM | POA: Diagnosis not present

## 2021-10-15 DIAGNOSIS — Z1231 Encounter for screening mammogram for malignant neoplasm of breast: Secondary | ICD-10-CM | POA: Diagnosis not present

## 2021-10-15 DIAGNOSIS — C50411 Malignant neoplasm of upper-outer quadrant of right female breast: Secondary | ICD-10-CM | POA: Insufficient documentation

## 2021-10-16 ENCOUNTER — Encounter: Payer: Self-pay | Admitting: Family Medicine

## 2021-10-16 DIAGNOSIS — L02219 Cutaneous abscess of trunk, unspecified: Secondary | ICD-10-CM | POA: Insufficient documentation

## 2021-10-16 NOTE — Assessment & Plan Note (Signed)
No systemic symptoms.  Chronic and recurrent.   -Verbal consent obtained for expression of  Material.  Area cleansed with alcohol and betadine. Expressed large amount of thick yellow/green discharge. Area remains fluctuant after expression.  Was unable to continue given pain.  Procedure stopped.  Was able to express enough material to provide some relief.  Area cleansed and covered with nonadherent dressing. -Recommend warm compresses frequently -Culture wound sent -Doxycycline 100 mg BID x 10 days -Follow up with Dermatology as scheduled.  -Strict return precautions provided. -Follow up in 1 week with PCP to ensure healing

## 2021-10-16 NOTE — Assessment & Plan Note (Deleted)
No systemic symptoms.  Chronic and recurrent.   -Verbal consent obtained for expression of  Material.  Area cleansed with alcohol and betadine. Expressed large amount of thick yellow/green discharge. Area remains fluctuant after expression.  Was unable to continue given pain.  Procedure stopped.  Was able to express enough material to provide some relief.  Area cleansed and covered with nonadherent dressing. -Recommend warm compresses frequently -Culture wound sent -Doxycycline 100 mg BID x 10 days -Follow up with Dermatology as scheduled.  -Strict return precautions provided. -Follow up in 1 week with PCP to ensure healing

## 2021-10-20 ENCOUNTER — Telehealth: Payer: Self-pay

## 2021-10-20 NOTE — Telephone Encounter (Signed)
Very welcome.  Glad to have made her feel better.

## 2021-10-20 NOTE — Telephone Encounter (Signed)
Patient called to thank Dr. Volanda Napoleon for draining the spot on her back and prescribing antibiotics . Patient went to Dermatology today and he said that what he would have done and if it acts up again he will drain it.

## 2021-11-03 ENCOUNTER — Ambulatory Visit (INDEPENDENT_AMBULATORY_CARE_PROVIDER_SITE_OTHER): Payer: Medicare Other

## 2021-11-03 DIAGNOSIS — Z0000 Encounter for general adult medical examination without abnormal findings: Secondary | ICD-10-CM | POA: Diagnosis not present

## 2021-11-03 NOTE — Progress Notes (Signed)
Virtual Visit via Telephone Note  I connected with  [name] on 11/03/21 at  9:00 AM EDT by telephone and verified that I am speaking with the correct person using two identifiers.  Medicare Annual Wellness visit completed telephonically due to Covid-19 pandemic.   Persons participating in this call: This Health Coach and this patient.   Location: Patient: home Provider: office   I discussed the limitations, risks, security and privacy concerns of performing an evaluation and management service by telephone and the availability of in person appointments. The patient expressed understanding and agreed to proceed.  Unable to perform video visit due to video visit attempted and failed and/or patient does not have video capability.   Some vital signs may be absent or patient reported.   Tracy Brace, LPN   Subjective:   Tracy Lawrence is a 81 y.o. female who presents for Medicare Annual (Subsequent) preventive examination.  Review of Systems     Cardiac Risk Factors include: advanced age (>8mn, >>3women);diabetes mellitus;dyslipidemia;hypertension     Objective:    There were no vitals filed for this visit. There is no height or weight on file to calculate BMI.     11/03/2021    8:55 AM 09/09/2021    1:48 PM 03/10/2021    2:14 PM 11/02/2020   12:57 PM 10/28/2020    1:27 PM 07/29/2020    9:57 AM 04/06/2020    2:45 PM  Advanced Directives  Does Patient Have a Medical Advance Directive? Yes Yes Yes Yes Yes Yes Yes  Type of [place]  Living will [place] Living will Living will  Does patient want to make changes to medical advance directive?   No - Patient declined No - Patient declined No - Patient declined    Copy of HMilpitasin Chart? No - copy requested   No - copy requested No - copy requested      Current Medications  (verified) Outpatient Encounter Medications as of 11/03/2021  Medication Sig   acetaminophen (TYLENOL) 500 MG tablet Take 1,000 mg by mouth every 6 (six) hours as needed for moderate pain or headache.   allopurinol (ZYLOPRIM) 100 MG tablet TAKE 1 TABLET(100 MG) BY MOUTH DAILY   anastrozole (ARIMIDEX) 1 MG tablet Take 1 tablet (1 mg total) by mouth daily.   Calcium Carb-Cholecalciferol (CALCIUM 600 + D PO) Take 1 tablet by mouth daily.   calcium carbonate (TUMS - DOSED IN MG ELEMENTAL CALCIUM) 500 MG chewable tablet Chew 1,000 mg by mouth daily as needed for indigestion or heartburn.   Carboxymethylcellul-Glycerin (LUBRICATING EYE DROPS OP) Place 1 drop into both eyes daily as needed (irritated eyes).   escitalopram (LEXAPRO) 10 MG tablet TAKE 1 TABLET BY MOUTH EVERY DAY FOR DEPRESSION   ferrous sulfate 325 (65 FE) MG tablet Take 1 tablet (325 mg total) by mouth 2 (two) times daily with a meal.   hydrochlorothiazide (MICROZIDE) 12.5 MG capsule TAKE 1 CAPSULE(12.5 MG) BY MOUTH DAILY   losartan (COZAAR) 100 MG tablet TAKE 1 TABLET(100 MG) BY MOUTH DAILY   Multiple Vitamins-Minerals (EYE VITAMINS PO) Take 1 tablet by mouth daily.    rosuvastatin (CRESTOR) 40 MG tablet TAKE 1 TABLET BY MOUTH EVERY DAY FOR CHOLESTEROL   No facility-administered encounter medications on file as of 11/03/2021.    Allergies (verified) Patient has no known allergies.   History: Past Medical History:  Diagnosis Date   Anemia    Arthritis    Breast cancer ([place]) 2002   right breast   Breast cancer ([place]) 2011   left breast   Cancer [facility])    Family history of breast cancer    Family history of lung cancer    Family history of non-Hodgkin's lymphoma    Family history of prostate cancer    Hypertension    Malignant neoplasm of right breast [facility])    Personal history of radiation therapy 2002, 2011   right and left breast   Pre-diabetes    Past Surgical History:  Procedure Laterality Date   ABDOMINAL  HYSTERECTOMY  2018   BREAST BIOPSY Right 10/04/2018   u/s bx, vision marker, path pending   BREAST LUMPECTOMY Right 2002   breast ca with rad   BREAST LUMPECTOMY Left 2011   breast ca with rad   BREAST SURGERY  2011,2002   CATARACT EXTRACTION  2015   COLONOSCOPY     COLONOSCOPY WITH PROPOFOL N/A 06/14/2019   Procedure: COLONOSCOPY WITH PROPOFOL;  Surgeon: Lin Landsman, MD;  Location: [place];  Service: Gastroenterology;  Laterality: N/A;   ESOPHAGOGASTRODUODENOSCOPY (EGD) WITH PROPOFOL N/A 06/14/2019   Procedure: ESOPHAGOGASTRODUODENOSCOPY (EGD) WITH PROPOFOL;  Surgeon: Lin Landsman, MD;  Location: [facility] END[facility]COPY;  Service: Gastroenterology;  Laterality: N/A;   MASTECTOMY     SIMPLE MASTECTOMY WITH AXILLARY SENTINEL NODE BIOPSY Right 01/06/2020   Procedure: SIMPLE MASTECTOMY WITH AXILLARY SENTINEL NODE BIOPSY;  Surgeon: Robert Bellow, MD;  Location: [facility] ORS;  Service: General;  Laterality: Right;   Family History  Problem Relation Age of Onset   Heart attack Mother    Cancer Father    Lung cancer Father    Breast cancer Sister 104       dx again 29   Heart attack Sister    Non-Hodgkin's lymphoma Sister 31   Heart attack Sister    Dementia Sister 58   Breast cancer Paternal Aunt    Breast cancer Maternal Aunt    Prostate cancer Paternal Grandfather        metastic, d. 65s   Colon cancer Neg Hx    Social History   Socioeconomic History   Marital status: Married    Spouse name: Not on file   Number of children: Not on file   Years of education: Not on file   Highest education level: Not on file  Occupational History   Not on file  Tobacco Use   Smoking status: Former    Packs/day: 1.00    Years: 12.00    Total pack years: 12.00    Types: Cigarettes    Quit date: 03/18/1979    Years since quitting: 42.6   Smokeless tobacco: Never   Tobacco comments:    quit 40 years ago  Vaping Use   Vaping Use: Never used  Substance and Sexual Activity    Alcohol use: Yes    Comment: rarely   Drug use: Never   Sexual activity: Not on file  Other Topics Concern   Not on file  Social History Narrative   From [place]      Retired      Social Determinants of Health   Financial Resource Strain: Low Risk  (11/03/2021)   Overall Financial Resource Strain (CARDIA)    Difficulty of Paying Living Expenses: Not hard at all  Food Insecurity: No Food Insecurity (11/03/2021)   Hunger Vital Sign    Worried About  Running Out of Food in the Last Year: Never true    [place] in the Last Year: Never true  Transportation Needs: No Transportation Needs (11/03/2021)   PRAPARE - Hydrologist (Medical): No    Lack of Transportation (Non-Medical): No  Physical Activity: Insufficiently Active (11/03/2021)   Exercise Vital Sign    Days of Exercise per Week: 3 days    Minutes of Exercise per Session: 30 min  Stress: No Stress Concern Present (11/03/2021)   [place]    Feeling of Stress : Not at all  Social Connections: Moderately Integrated (11/03/2021)   Social Connection and Isolation Panel [NHANES]    Frequency of Communication with Friends and Family: Three times a week    Frequency of Social Gatherings with Friends and Family: More than three times a week    Attends Religious Services: More than 4 times per year    Active Member of [employer] or Organizations: No    Attends Music therapist: Never    Marital Status: Married    Tobacco Counseling Counseling given: Not Answered Tobacco comments: quit 40 years ago   Clinical Intake:  Pre-visit preparation completed: Yes  Pain : No/denies pain     Nutritional Risks: None Diabetes: Yes CBG done?: No Did pt. bring in CBG monitor from home?: No  How often do you need to have someone help you when you read instructions, pamphlets, or other written materials from your doctor or  pharmacy?: 1 - Never  Diabetic?Nutrition Risk Assessment:  Has the patient had any N/V/D within the last 2 months?  No  Does the patient have any non-healing wounds?  No  Has the patient had any unintentional weight loss or weight gain?  No   Diabetes:  Is the patient diabetic?  Yes  If diabetic, was a CBG obtained today?  No  Did the patient bring in their glucometer from home?  No  How often do you monitor your CBG's? As needed .   Financial Strains and Diabetes Management:  Are you having any financial strains with the device, your supplies or your medication? No .  Does the patient want to be seen by Chronic Care Management for management of their diabetes?  No  Would the patient like to be referred to a Nutritionist or for Diabetic Management?  No   Diabetic Exams:  Diabetic Eye Exam: Completed 09/10/21 Diabetic Foot Exam: Overdue, Pt has been advised about the importance in completing this exam. Pt is scheduled for diabetic foot exam on next appt .   Interpreter Needed?: No  Information entered by :: Charlott Rakes, LPN   Activities of Daily Living    11/03/2021    8:57 AM  In your present state of health, do you have any difficulty performing the following activities:  Hearing? 0  Vision? 0  Difficulty concentrating or making decisions? 0  Walking or climbing stairs? 0  Dressing or bathing? 0  Doing errands, shopping? 0  Preparing Food and eating ? N  Using the Toilet? N  In the past six months, have you accidently leaked urine? Y  Comment wears a pad  Do you have problems with loss of bowel control? N  Managing your Medications? N  Managing your Finances? N  Housekeeping or managing your Housekeeping? N    Patient Care Team: Burnard Hawthorne, FNP as PCP - General (Family Medicine) Al Pimple  F, RN (Inactive) as Oncology Nurse Navigator (Oncology) Noreene Filbert, MD as Radiation Oncologist (Radiation Oncology)  Indicate any recent Medical Services  you may have received from other than [facility] providers in the past year (date may be approximate).     Assessment:   This is a routine wellness examination for [place].  Hearing/Vision screen Hearing Screening - Comments:: Pt stated hearing difficulty at times  Vision Screening - Comments:: Pt follows up with [facility] for annul eye exams   Dietary issues and exercise activities discussed: Current Exercise Habits: Home exercise routine, Type of exercise: walking, Time (Minutes): 30, Frequency (Times/Week): 3, Weekly Exercise (Minutes/Week): 90   Goals Addressed             This Visit's Progress    Patient Stated       Lose a little more weight        Depression Screen    11/03/2021    8:54 AM 10/11/2021    3:30 PM 04/21/2021    2:38 PM 10/28/2020    1:25 PM 07/13/2020   12:32 PM 04/08/2020   10:46 AM 10/28/2019    1:10 PM  PHQ 2/9 Scores  PHQ - 2 Score 0 0 0 0 0 0 0  PHQ- 9 Score   0   2 0    Fall Risk    11/03/2021    8:56 AM 10/11/2021    3:30 PM 04/21/2021    2:37 PM 10/28/2020    1:28 PM 07/13/2020   12:31 PM  Fall Risk   Falls in the past year? 1 1 0 0 0  Number falls in past yr: 1 0 0 0 0  Injury with Fall? 0 0 0  0  Risk for fall due to : Impaired vision;Impaired balance/gait History of fall(s) No Fall Risks  No Fall Risks  Follow up Falls prevention discussed Falls evaluation completed Falls evaluation completed Falls evaluation completed Falls evaluation completed    [place]:  Any stairs in or around the home? Yes  If so, are there any without handrails? No  Home free of loose throw rugs in walkways, pet beds, electrical cords, etc? Yes  Adequate lighting in your home to reduce risk of falls? Yes   ASSISTIVE DEVICES UTILIZED TO PREVENT FALLS:  Life alert? No  Use of a cane, walker or w/c? No  Grab bars in the bathroom? No  Shower chair or bench in shower? No  Elevated toilet seat or a handicapped toilet? No    TIMED UP AND GO:  Was the test performed? No .   Cognitive Function:        11/03/2021    8:58 AM 10/28/2020    1:31 PM 10/17/2018   10:20 AM  6CIT Screen  What Year? 0 points 0 points 0 points  What month? 0 points 0 points 0 points  What time? 0 points 0 points 0 points  Count back from 20 0 points 0 points 0 points  Months in reverse 0 points 0 points 0 points  Repeat phrase 0 points 0 points 0 points  Total Score 0 points 0 points 0 points    Immunizations Immunization History  Administered Date(s) Administered   Fluad Quad(high Dose 65+) 10/16/2020   Influenza-Unspecified 12/03/2019, 10/28/2021   PFIZER(Purple Top)SARS-COV-2 Vaccination 03/07/2019, 03/28/2019   Pneumococcal Conjugate-13 12/09/2019   Pneumococcal Polysaccharide-23 05/05/2021    TDAP status: Due, Education has been provided regarding the importance of this  vaccine. Advised may receive this vaccine at local pharmacy or Health Dept. Aware to provide a copy of the vaccination record if obtained from local pharmacy or Health Dept. Verbalized acceptance and understanding.  Flu Vaccine status: Up to date  Pneumococcal vaccine status: Up to date  Covid-19 vaccine status: Completed vaccines  Qualifies for Shingles Vaccine? Yes   Zostavax completed No   Shingrix Completed?: No.    Education has been provided regarding the importance of this vaccine. Patient has been advised to call insurance company to determine out of pocket expense if they have not yet received this vaccine. Advised may also receive vaccine at local pharmacy or Health Dept. Verbalized acceptance and understanding.  Screening Tests Health Maintenance  Topic Date Due   Hepatitis C Screening  Never done   TETANUS/TDAP  Never done   Zoster Vaccines- Shingrix (1 of 2) Never done   COVID-19 Vaccine (3 - Pfizer risk series) 04/25/2019   FOOT EXAM  07/13/2021   HEMOGLOBIN A1C  03/25/2022   COLONOSCOPY (Pts 45-76yr Insurance coverage will  need to be confirmed)  06/14/2022   Diabetic kidney evaluation - GFR measurement  09/08/2022   OPHTHALMOLOGY EXAM  09/11/2022   Diabetic kidney evaluation - Urine ACR  10/28/2022   Pneumonia Vaccine 81 Years old  Completed   INFLUENZA VACCINE  Completed   DEXA SCAN  Completed   HPV VACCINES  Aged Out    Health Maintenance  Health Maintenance Due  Topic Date Due   Hepatitis C Screening  Never done   TETANUS/TDAP  Never done   Zoster Vaccines- Shingrix (1 of 2) Never done   COVID-19 Vaccine (3 - Pfizer risk series) 04/25/2019   FOOT EXAM  07/13/2021    Colorectal cancer screening: Type of screening: Colonoscopy. Completed 06/14/19. Repeat every 3 years  Mammogram status: Completed 10/18/21. Repeat every year  Bone Density status: Completed 10/14/20. Results reflect: Bone density results: NORMAL. Repeat every 2 years.   Additional Screening:  Hepatitis C Screening: does qualify;  Vision Screening: Recommended annual ophthalmology exams for early detection of glaucoma and other disorders of the eye. Is the patient up to date with their annual eye exam?  Yes  Who is the provider or what is the name of the office in which the patient attends annual eye exams? [facility]  If pt is not established with a provider, would they like to be referred to a provider to establish care? No .   Dental Screening: Recommended annual dental exams for proper oral hygiene  Community Resource Referral / Chronic Care Management: CRR required this visit?  No   CCM required this visit?  No      Plan:     I have personally reviewed and noted the following in the patient's chart:   Medical and social history Use of alcohol, tobacco or illicit drugs  Current medications and supplements including opioid prescriptions. Patient is not currently taking opioid prescriptions. Functional ability and status Nutritional status Physical activity Advanced directives List of other  physicians Hospitalizations, surgeries, and ER visits in previous 12 months Vitals Screenings to include cognitive, depression, and falls Referrals and appointments  In addition, I have reviewed and discussed with patient certain preventive protocols, quality metrics, and best practice recommendations. A written personalized care plan for preventive services as well as general preventive health recommendations were provided to patient.     TWillette Brace LPN   [id number]  Nurse Notes: none

## 2021-11-03 NOTE — Patient Instructions (Signed)
Tracy Lawrence , Thank you for taking time to come for your Medicare Wellness Visit. I appreciate your ongoing commitment to your health goals. Please review the following plan we discussed and let me know if I can assist you in the future.   These are the goals we discussed:  Goals       Increase physical activity (pt-stated)      I would like to walk more for exercise      Patient Stated      Lose a little more weight         This is a list of the screening recommended for you and due dates:  Health Maintenance  Topic Date Due   Hepatitis C Screening: USPSTF Recommendation to screen - Ages 62-79 yo.  Never done   Tetanus Vaccine  Never done   Zoster (Shingles) Vaccine (1 of 2) Never done   COVID-19 Vaccine (3 - Pfizer risk series) 04/25/2019   Complete foot exam   07/13/2021   Hemoglobin A1C  03/25/2022   Colon Cancer Screening  06/14/2022   Yearly kidney function blood test for diabetes  09/08/2022   Eye exam for diabetics  09/11/2022   Yearly kidney health urinalysis for diabetes  10/28/2022   Pneumonia Vaccine  Completed   Flu Shot  Completed   DEXA scan (bone density measurement)  Completed   HPV Vaccine  Aged Out    Advanced directives: Please bring a copy of your health care power of attorney and living will to the office at your convenience.  Conditions/risks identified: lose a little weight   Next appointment: Follow up in one year for your annual wellness visit    Preventive Care 65 Years and Older, Female Preventive care refers to lifestyle choices and visits with your health care provider that can promote health and wellness. What does preventive care include? A yearly physical exam. This is also called an annual well check. Dental exams once or twice a year. Routine eye exams. Ask your health care provider how often you should have your eyes checked. Personal lifestyle choices, including: Daily care of your teeth and gums. Regular physical activity. Eating  a healthy diet. Avoiding tobacco and drug use. Limiting alcohol use. Practicing safe sex. Taking low-dose aspirin every day. Taking vitamin and mineral supplements as recommended by your health care provider. What happens during an annual well check? The services and screenings done by your health care provider during your annual well check will depend on your age, overall health, lifestyle risk factors, and family history of disease. Counseling  Your health care provider may ask you questions about your: Alcohol use. Tobacco use. Drug use. Emotional well-being. Home and relationship well-being. Sexual activity. Eating habits. History of falls. Memory and ability to understand (cognition). Work and work Statistician. Reproductive health. Screening  You may have the following tests or measurements: Height, weight, and BMI. Blood pressure. Lipid and cholesterol levels. These may be checked every 5 years, or more frequently if you are over 20 years old. Skin check. Lung cancer screening. You may have this screening every year starting at age 3 if you have a 30-pack-year history of smoking and currently smoke or have quit within the past 15 years. Fecal occult blood test (FOBT) of the stool. You may have this test every year starting at age 26. Flexible sigmoidoscopy or colonoscopy. You may have a sigmoidoscopy every 5 years or a colonoscopy every 10 years starting at age 47. Hepatitis C blood  test. Hepatitis B blood test. Sexually transmitted disease (STD) testing. Diabetes screening. This is done by checking your blood sugar (glucose) after you have not eaten for a while (fasting). You may have this done every 1-3 years. Bone density scan. This is done to screen for osteoporosis. You may have this done starting at age 95. Mammogram. This may be done every 1-2 years. Talk to your health care provider about how often you should have regular mammograms. Talk with your health care  provider about your test results, treatment options, and if necessary, the need for more tests. Vaccines  Your health care provider may recommend certain vaccines, such as: Influenza vaccine. This is recommended every year. Tetanus, diphtheria, and acellular pertussis (Tdap, Td) vaccine. You may need a Td booster every 10 years. Zoster vaccine. You may need this after age 20. Pneumococcal 13-valent conjugate (PCV13) vaccine. One dose is recommended after age 7. Pneumococcal polysaccharide (PPSV23) vaccine. One dose is recommended after age 70. Talk to your health care provider about which screenings and vaccines you need and how often you need them. This information is not intended to replace advice given to you by your health care provider. Make sure you discuss any questions you have with your health care provider. Document Released: 02/13/2015 Document Revised: 10/07/2015 Document Reviewed: 11/18/2014 Elsevier Interactive Patient Education  2017 [place] Prevention in the Home Falls can cause injuries. They can happen to people of all ages. There are many things you can do to make your home safe and to help prevent falls. What can I do on the outside of my home? Regularly fix the edges of walkways and driveways and fix any cracks. Remove anything that might make you trip as you walk through a door, such as a raised step or threshold. Trim any bushes or trees on the path to your home. Use bright outdoor lighting. Clear any walking paths of anything that might make someone trip, such as rocks or tools. Regularly check to see if handrails are loose or broken. Make sure that both sides of any steps have handrails. Any raised decks and porches should have guardrails on the edges. Have any leaves, snow, or ice cleared regularly. Use sand or salt on walking paths during winter. Clean up any spills in your garage right away. This includes oil or grease spills. What can I do in the  bathroom? Use night lights. Install grab bars by the toilet and in the tub and shower. Do not use towel bars as grab bars. Use non-skid mats or decals in the tub or shower. If you need to sit down in the shower, use a plastic, non-slip stool. Keep the floor dry. Clean up any water that spills on the floor as soon as it happens. Remove soap buildup in the tub or shower regularly. Attach bath mats securely with double-sided non-slip rug tape. Do not have throw rugs and other things on the floor that can make you trip. What can I do in the bedroom? Use night lights. Make sure that you have a light by your bed that is easy to reach. Do not use any sheets or blankets that are too big for your bed. They should not hang down onto the floor. Have a firm chair that has side arms. You can use this for support while you get dressed. Do not have throw rugs and other things on the floor that can make you trip. What can I do in the kitchen? Clean  up any spills right away. Avoid walking on wet floors. Keep items that you use a lot in easy-to-reach places. If you need to reach something above you, use a strong step stool that has a grab bar. Keep electrical cords out of the way. Do not use floor polish or wax that makes floors slippery. If you must use wax, use non-skid floor wax. Do not have throw rugs and other things on the floor that can make you trip. What can I do with my stairs? Do not leave any items on the stairs. Make sure that there are handrails on both sides of the stairs and use them. Fix handrails that are broken or loose. Make sure that handrails are as long as the stairways. Check any carpeting to make sure that it is firmly attached to the stairs. Fix any carpet that is loose or worn. Avoid having throw rugs at the top or bottom of the stairs. If you do have throw rugs, attach them to the floor with carpet tape. Make sure that you have a light switch at the top of the stairs and the  bottom of the stairs. If you do not have them, ask someone to add them for you. What else can I do to help prevent falls? Wear shoes that: Do not have high heels. Have rubber bottoms. Are comfortable and fit you well. Are closed at the toe. Do not wear sandals. If you use a stepladder: Make sure that it is fully opened. Do not climb a closed stepladder. Make sure that both sides of the stepladder are locked into place. Ask someone to hold it for you, if possible. Clearly mark and make sure that you can see: Any grab bars or handrails. First and last steps. Where the edge of each step is. Use tools that help you move around (mobility aids) if they are needed. These include: Canes. Walkers. Scooters. Crutches. Turn on the lights when you go into a dark area. Replace any light bulbs as soon as they burn out. Set up your furniture so you have a clear path. Avoid moving your furniture around. If any of your floors are uneven, fix them. If there are any pets around you, be aware of where they are. Review your medicines with your doctor. Some medicines can make you feel dizzy. This can increase your chance of falling. Ask your doctor what other things that you can do to help prevent falls. This information is not intended to replace advice given to you by your health care provider. Make sure you discuss any questions you have with your health care provider. Document Released: 11/13/2008 Document Revised: 06/25/2015 Document Reviewed: 02/21/2014 Elsevier Interactive Patient Education  2017 [employer].

## 2021-11-23 ENCOUNTER — Other Ambulatory Visit: Payer: Self-pay | Admitting: Family

## 2021-12-26 ENCOUNTER — Telehealth: Payer: Self-pay | Admitting: Family

## 2022-01-19 ENCOUNTER — Other Ambulatory Visit: Payer: Self-pay | Admitting: Family

## 2022-01-19 DIAGNOSIS — F32A Depression, unspecified: Secondary | ICD-10-CM

## 2022-01-19 MED ORDER — ESCITALOPRAM OXALATE 10 MG PO TABS: 10.0000 mg | ORAL_TABLET | Freq: Every day | ORAL | 3 refills | Status: DC

## 2022-01-19 NOTE — Telephone Encounter (Signed)
Pt need refill on escitalopram sent to [facility]

## 2022-02-21 ENCOUNTER — Encounter: Payer: Self-pay | Admitting: Family

## 2022-02-21 ENCOUNTER — Ambulatory Visit (INDEPENDENT_AMBULATORY_CARE_PROVIDER_SITE_OTHER): Payer: Medicare Other | Admitting: Family

## 2022-02-21 VITALS — BP 136/82 | HR 73 | Temp 97.8°F | Ht 61.0 in | Wt 150.0 lb

## 2022-02-21 DIAGNOSIS — I10 Essential (primary) hypertension: Secondary | ICD-10-CM

## 2022-02-21 DIAGNOSIS — N6310 Unspecified lump in the right breast, unspecified quadrant: Secondary | ICD-10-CM

## 2022-02-21 DIAGNOSIS — R29898 Other symptoms and signs involving the musculoskeletal system: Secondary | ICD-10-CM | POA: Diagnosis not present

## 2022-02-21 DIAGNOSIS — E785 Hyperlipidemia, unspecified: Secondary | ICD-10-CM

## 2022-02-21 DIAGNOSIS — E1122 Type 2 diabetes mellitus with diabetic chronic kidney disease: Secondary | ICD-10-CM

## 2022-02-21 DIAGNOSIS — Z1211 Encounter for screening for malignant neoplasm of colon: Secondary | ICD-10-CM

## 2022-02-21 DIAGNOSIS — Z1159 Encounter for screening for other viral diseases: Secondary | ICD-10-CM

## 2022-02-21 DIAGNOSIS — E119 Type 2 diabetes mellitus without complications: Secondary | ICD-10-CM

## 2022-02-21 DIAGNOSIS — R7309 Other abnormal glucose: Secondary | ICD-10-CM

## 2022-02-21 DIAGNOSIS — N183 Chronic kidney disease, stage 3 unspecified: Secondary | ICD-10-CM

## 2022-02-21 LAB — LIPID PANEL
Cholesterol: 280 mg/dL — ABNORMAL HIGH (ref 0–200)
HDL: 57.6 mg/dL (ref >39.00)
LDL Cholesterol: 199 mg/dL — ABNORMAL HIGH (ref 0–99)
NonHDL: 222.35
Total CHOL/HDL Ratio: 5
Triglycerides: 115 mg/dL (ref 0.0–149.0)
VLDL: 23 mg/dL (ref 0.0–40.0)

## 2022-02-21 LAB — COMPREHENSIVE METABOLIC PANEL
ALT: 13 U/L (ref 0–35)
AST: 16 U/L (ref 0–37)
Albumin: 4.1 g/dL (ref 3.5–5.2)
Alkaline Phosphatase: 68 U/L (ref 39–117)
BUN: 24 mg/dL — ABNORMAL HIGH (ref 6–23)
CO2: 30 mEq/L (ref 19–32)
Calcium: 10.6 mg/dL — ABNORMAL HIGH (ref 8.4–10.5)
Chloride: 96 mEq/L (ref 96–112)
Creatinine, Ser: 1.06 mg/dL (ref 0.40–1.20)
GFR: 49.86 mL/min — ABNORMAL LOW (ref >60.00)
Glucose, Bld: 94 mg/dL (ref 70–99)
Potassium: 4.7 mEq/L (ref 3.5–5.1)
Sodium: 132 mEq/L — ABNORMAL LOW (ref 135–145)
Total Bilirubin: 0.6 mg/dL (ref 0.2–1.2)
Total Protein: 7.5 g/dL (ref 6.0–8.3)

## 2022-02-21 LAB — POCT GLYCOSYLATED HEMOGLOBIN (HGB A1C): Hemoglobin A1C: 5.7 % — AB (ref 4.0–5.6)

## 2022-02-21 LAB — MICROALBUMIN / CREATININE URINE RATIO
Creatinine,U: 41.3 mg/dL
Microalb Creat Ratio: 13.1 mg/g (ref 0.0–30.0)
Microalb, Ur: 5.4 mg/dL — ABNORMAL HIGH (ref 0.0–1.9)

## 2022-02-21 LAB — URIC ACID: Uric Acid, Serum: 6.2 mg/dL (ref 2.4–7.0)

## 2022-02-21 MED ORDER — ALLOPURINOL 100 MG PO TABS: ORAL_TABLET | ORAL | 3 refills | Status: DC

## 2022-02-21 NOTE — Assessment & Plan Note (Signed)
Pending lipid panel. Continue crestor '40mg'$ 

## 2022-02-21 NOTE — Patient Instructions (Addendum)
Referral to physical therapy, [facility] for colonoscopy, and also general surgery to establish care Dr Lysle Pearl   Let us know if you dont hear back within a week in regards to an appointment being scheduled.   So that you are aware, if you are [facility] MyChart user , please pay attention to your MyChart messages as you may receive a MyChart message with a phone number to call and schedule this test/appointment own your own from our referral coordinator. This is a new process so I do not want you to miss this message.  If you are not a MyChart user, you will receive a phone call.   Please let me know how you are doing and certainly if any concerns with balance or blood sugar , please make an earlier appointment as discussed.

## 2022-02-21 NOTE — Assessment & Plan Note (Signed)
Lab Results  Component Value Date   HGBA1C 5.7 (A) 02/21/2022   Excellent control.  Will monitor

## 2022-02-21 NOTE — Assessment & Plan Note (Signed)
Subtle on exam, most obvious with get up and go test.  No persistent dizziness.  Pending referral to physical therapy for gait balance assessment as well as lower body strengthening.  Close follow-up

## 2022-02-21 NOTE — Assessment & Plan Note (Signed)
Chronic, stable.  Continue  hydrochlorothiazide 12.5 mg qd, losartan 100 mg qd

## 2022-02-21 NOTE — Progress Notes (Signed)
Assessment & Plan:  Diabetes mellitus without complication [facility]) Assessment & Plan: Lab Results  Component Value Date   HGBA1C 5.7 (A) 02/21/2022   Excellent control.  Will monitor  Orders: -     Microalbumin / creatinine urine ratio -     Comprehensive metabolic panel -     Lipid panel  Elevated glucose -     POCT glycosylated hemoglobin (Hb A1C)  Weakness of both lower extremities Assessment & Plan: Subtle on exam, most obvious with get up and go test.  No persistent dizziness.  Pending referral to physical therapy for gait balance assessment as well as lower body strengthening.  Close follow-up  Orders: -     Ambulatory referral to Physical Therapy  CKD stage 3 due to type 2 diabetes mellitus (HCC) -     Allopurinol; TAKE 1 TABLET(100 MG) BY MOUTH DAILY  Dispense: 90 tablet; Refill: 3 -     Uric acid  Mass of right breast, unspecified quadrant -     Ambulatory referral to General Surgery  Encounter for hepatitis C screening test for low risk patient -     Hepatitis C antibody  Screen for colon cancer -     Ambulatory referral to Gastroenterology  Essential hypertension Assessment & Plan: Chronic, stable.  Continue  hydrochlorothiazide 12.5 mg qd, losartan 100 mg qd    Hyperlipidemia, unspecified hyperlipidemia type Assessment & Plan: Pending lipid panel. Continue crestor '40mg'$       Return precautions given.   Risks, benefits, and alternatives of the medications and treatment plan prescribed today were discussed, and patient expressed understanding.   Education regarding symptom management and diagnosis given to patient on AVS either electronically or printed.  Return in about 6 months (around 08/22/2022).  Mable Paris, FNP  Subjective:    Patient ID: Georgetta Haber, female    DOB: 20-Jun-1940, 82 y.o.   MRN: [id number]  CC: JEILANI GRUPE is a 82 y.o. female who presents today for follow up.   HPI: Accompanied by husband today .   she  notes going down stairs, feels like steps are 'coming up to me'.  She has no problem going up stairs.  She feels lower body strength has decreased.  Symptom was better when going to the gym. She would do exercises when standing on one foot.  Exercise has decreased since holidays.   No numbness in feet, knees are not giving way.  No syncope, vertigo.  1-2 brief episodes of self limiting dizziness that may last a couple of seconds.   Eye exam is next month.  Denies abrupt changes in vision.    DM-previously had been on metformin, managing with diet.   Previously followed Dr. Dondra Prader, for right breast cancer. transfer of care to Dr Lysle Pearl  She continues to follow with Dr Tasia Catchings   due colonoscopy Allergies: Patient has no known allergies. Current Outpatient Medications on File Prior to Visit  Medication Sig Dispense Refill   acetaminophen (TYLENOL) 500 MG tablet Take 1,000 mg by mouth every 6 (six) hours as needed for moderate pain or headache.     anastrozole (ARIMIDEX) 1 MG tablet Take 1 tablet (1 mg total) by mouth daily. 90 tablet 1   Calcium Carb-Cholecalciferol (CALCIUM 600 + D PO) Take 1 tablet by mouth daily.     calcium carbonate (TUMS - DOSED IN MG ELEMENTAL CALCIUM) 500 MG chewable tablet Chew 1,000 mg by mouth daily as needed for indigestion or heartburn.  Carboxymethylcellul-Glycerin (LUBRICATING EYE DROPS OP) Place 1 drop into both eyes daily as needed (irritated eyes).     escitalopram (LEXAPRO) 10 MG tablet Take 1 tablet (10 mg total) by mouth daily. 90 tablet 3   ferrous sulfate 325 (65 FE) MG tablet Take 1 tablet (325 mg total) by mouth 2 (two) times daily with a meal. 60 tablet 0   hydrochlorothiazide (MICROZIDE) 12.5 MG capsule TAKE 1 CAPSULE(12.5 MG) BY MOUTH DAILY 90 capsule 1   losartan (COZAAR) 100 MG tablet TAKE 1 TABLET(100 MG) BY MOUTH DAILY 90 tablet 3   Multiple Vitamins-Minerals (EYE VITAMINS PO) Take 1 tablet by mouth daily.      rosuvastatin (CRESTOR) 40 MG  tablet TAKE 1 TABLET BY MOUTH EVERY DAY FOR CHOLESTEROL 90 tablet 0   No current facility-administered medications on file prior to visit.    Review of Systems  Constitutional:  Negative for chills and fever.  Respiratory:  Negative for cough.   Cardiovascular:  Negative for chest pain and palpitations.  Gastrointestinal:  Negative for nausea and vomiting.  Musculoskeletal:  Negative for arthralgias.  Neurological:  Positive for dizziness.      Objective:    BP 136/82   Pulse 73   Temp 97.8 F (36.6 C) (Oral)   Ht '5\' 1"'$  (1.549 m)   Wt 150 lb (68 kg)   LMP  (LMP Unknown)   SpO2 99%   BMI 28.34 kg/m  BP Readings from Last 3 Encounters:  02/21/22 136/82  10/11/21 136/70  09/22/21 132/70   Wt Readings from Last 3 Encounters:  02/21/22 150 lb (68 kg)  10/11/21 144 lb 3.2 oz (65.4 kg)  09/22/21 145 lb (65.8 kg)    Physical Exam Vitals reviewed.  Constitutional:      Appearance: She is well-developed.  Eyes:     Conjunctiva/sclera: Conjunctivae normal.  Cardiovascular:     Rate and Rhythm: Normal rate and regular rhythm.     Pulses: Normal pulses.     Heart sounds: Normal heart sounds.  Pulmonary:     Effort: Pulmonary effort is normal.     Breath sounds: Normal breath sounds. No wheezing, rhonchi or rales.  Musculoskeletal:     Comments: She gets out of chair quickly but uses her arms to push off chair rails.   Skin:    General: Skin is warm and dry.  Neurological:     Mental Status: She is alert.     Comments: Sensation intact bilateral lower extremities.  5 out of 5 strength bilaterally  Psychiatric:        Speech: Speech normal.        Behavior: Behavior normal.        Thought Content: Thought content normal.

## 2022-02-24 ENCOUNTER — Other Ambulatory Visit: Payer: Self-pay | Admitting: Family

## 2022-02-24 DIAGNOSIS — I10 Essential (primary) hypertension: Secondary | ICD-10-CM

## 2022-03-01 ENCOUNTER — Telehealth: Payer: Self-pay

## 2022-03-01 NOTE — Telephone Encounter (Signed)
LVM to call back to office  

## 2022-03-02 NOTE — Telephone Encounter (Signed)
LVM with pt husband to call back to office

## 2022-03-02 NOTE — Telephone Encounter (Signed)
Spoke to pt and went over results and pt verbalized understanding and scheduled appt with you on 03/22/22

## 2022-03-02 NOTE — Telephone Encounter (Signed)
Patient returned Jenate's phone call. Please call her at [phone].

## 2022-03-02 NOTE — Telephone Encounter (Signed)
Pt was returning call. She's available '@[phone]'$ [phone].

## 2022-03-07 ENCOUNTER — Other Ambulatory Visit: Payer: Self-pay | Admitting: *Deleted

## 2022-03-07 ENCOUNTER — Telehealth: Payer: Self-pay | Admitting: *Deleted

## 2022-03-07 ENCOUNTER — Telehealth: Payer: Self-pay | Admitting: Gastroenterology

## 2022-03-07 DIAGNOSIS — Z86010 Personal history of colonic polyps: Secondary | ICD-10-CM

## 2022-03-07 MED ORDER — PEG 3350-KCL-NABCB-NACL-NASULF 236 G PO SOLR: 4000.0000 mL | Freq: Once | ORAL | 0 refills | Status: AC

## 2022-03-07 NOTE — Telephone Encounter (Signed)
Colonoscopy scheduled 06/17/2022

## 2022-03-07 NOTE — Telephone Encounter (Signed)
Patient calling to schedule colonoscopy. Requesting call back.  

## 2022-03-07 NOTE — Telephone Encounter (Signed)
Gastroenterology Pre-Procedure Review  Request Date: 06/17/2022 Requesting Physician: Dr. Marius Ditch  PATIENT REVIEW QUESTIONS: The patient responded to the following health history questions as indicated:    1. Are you having any GI issues? no 2. Do you have a personal history of Polyps? yes (last colonoscopy 06/14/2019) 3. Do you have a family history of Colon Cancer or Polyps? no 4. Diabetes Mellitus? no 5. Joint replacements in the past 12 months?no 6. Major health problems in the past 3 months?no 7. Any artificial heart valves, MVP, or defibrillator?no    MEDICATIONS & ALLERGIES:    Patient reports the following regarding taking any anticoagulation/antiplatelet therapy:   Plavix, Coumadin, Eliquis, Xarelto, Lovenox, Pradaxa, Brilinta, or Effient? no Aspirin? no  Patient confirms/reports the following medications:  Current Outpatient Medications  Medication Sig Dispense Refill   polyethylene glycol (GOLYTELY) 236 g solution Take 4,000 mLs by mouth once for 1 dose. 4000 mL 0   acetaminophen (TYLENOL) 500 MG tablet Take 1,000 mg by mouth every 6 (six) hours as needed for moderate pain or headache.     allopurinol (ZYLOPRIM) 100 MG tablet TAKE 1 TABLET(100 MG) BY MOUTH DAILY 90 tablet 3   amLODipine (NORVASC) 2.5 MG tablet Take 2.5 mg by mouth daily.     anastrozole (ARIMIDEX) 1 MG tablet Take 1 tablet (1 mg total) by mouth daily. 90 tablet 1   Calcium Carb-Cholecalciferol (CALCIUM 600 + D PO) Take 1 tablet by mouth daily.     calcium carbonate (TUMS - DOSED IN MG ELEMENTAL CALCIUM) 500 MG chewable tablet Chew 1,000 mg by mouth daily as needed for indigestion or heartburn.     Carboxymethylcellul-Glycerin (LUBRICATING EYE DROPS OP) Place 1 drop into both eyes daily as needed (irritated eyes).     escitalopram (LEXAPRO) 10 MG tablet Take 1 tablet (10 mg total) by mouth daily. 90 tablet 3   ferrous sulfate 325 (65 FE) MG tablet Take 1 tablet (325 mg total) by mouth 2 (two) times daily with a  meal. 60 tablet 0   hydrochlorothiazide (MICROZIDE) 12.5 MG capsule TAKE 1 CAPSULE(12.5 MG) BY MOUTH DAILY 90 capsule 1   losartan (COZAAR) 100 MG tablet TAKE 1 TABLET(100 MG) BY MOUTH DAILY 90 tablet 3   Multiple Vitamins-Minerals (EYE VITAMINS PO) Take 1 tablet by mouth daily.      rosuvastatin (CRESTOR) 40 MG tablet TAKE 1 TABLET BY MOUTH EVERY DAY FOR CHOLESTEROL 90 tablet 0   No current facility-administered medications for this visit.    Patient confirms/reports the following allergies:  No Known Allergies  No orders of the defined types were placed in this encounter.   AUTHORIZATION INFORMATION Primary Insurance: 1D#: Group #:  Secondary Insurance: 1D#: Group #:  SCHEDULE INFORMATION: Date: 06/17/2022 Time: Location: [place]

## 2022-03-11 ENCOUNTER — Inpatient Hospital Stay: Payer: Medicare Other | Attending: Radiation Oncology

## 2022-03-11 DIAGNOSIS — C50911 Malignant neoplasm of unspecified site of right female breast: Secondary | ICD-10-CM | POA: Insufficient documentation

## 2022-03-11 DIAGNOSIS — Z79811 Long term (current) use of aromatase inhibitors: Secondary | ICD-10-CM | POA: Insufficient documentation

## 2022-03-11 DIAGNOSIS — Z8042 Family history of malignant neoplasm of prostate: Secondary | ICD-10-CM | POA: Insufficient documentation

## 2022-03-11 DIAGNOSIS — Z79899 Other long term (current) drug therapy: Secondary | ICD-10-CM | POA: Insufficient documentation

## 2022-03-11 DIAGNOSIS — E1122 Type 2 diabetes mellitus with diabetic chronic kidney disease: Secondary | ICD-10-CM | POA: Insufficient documentation

## 2022-03-11 DIAGNOSIS — Z7901 Long term (current) use of anticoagulants: Secondary | ICD-10-CM | POA: Insufficient documentation

## 2022-03-11 DIAGNOSIS — N183 Chronic kidney disease, stage 3 unspecified: Secondary | ICD-10-CM | POA: Diagnosis not present

## 2022-03-11 DIAGNOSIS — Z801 Family history of malignant neoplasm of trachea, bronchus and lung: Secondary | ICD-10-CM | POA: Insufficient documentation

## 2022-03-11 DIAGNOSIS — I129 Hypertensive chronic kidney disease with stage 1 through stage 4 chronic kidney disease, or unspecified chronic kidney disease: Secondary | ICD-10-CM | POA: Diagnosis not present

## 2022-03-11 DIAGNOSIS — Z807 Family history of other malignant neoplasms of lymphoid, hematopoietic and related tissues: Secondary | ICD-10-CM | POA: Insufficient documentation

## 2022-03-11 DIAGNOSIS — Z170 Estrogen receptor positive status [ER+]: Secondary | ICD-10-CM | POA: Diagnosis not present

## 2022-03-11 DIAGNOSIS — D563 Thalassemia minor: Secondary | ICD-10-CM | POA: Insufficient documentation

## 2022-03-11 DIAGNOSIS — D631 Anemia in chronic kidney disease: Secondary | ICD-10-CM | POA: Insufficient documentation

## 2022-03-11 DIAGNOSIS — Z803 Family history of malignant neoplasm of breast: Secondary | ICD-10-CM | POA: Diagnosis not present

## 2022-03-11 LAB — COMPREHENSIVE METABOLIC PANEL
ALT: 14 U/L (ref 0–44)
AST: 20 U/L (ref 15–41)
Albumin: 4 g/dL (ref 3.5–5.0)
Alkaline Phosphatase: 63 U/L (ref 38–126)
Anion gap: 9 (ref 5–15)
BUN: 25 mg/dL — ABNORMAL HIGH (ref 8–23)
CO2: 24 mmol/L (ref 22–32)
Calcium: 9.4 mg/dL (ref 8.9–10.3)
Chloride: 97 mmol/L — ABNORMAL LOW (ref 98–111)
Creatinine, Ser: 1.19 mg/dL — ABNORMAL HIGH (ref 0.44–1.00)
GFR, Estimated: 47 mL/min — ABNORMAL LOW (ref >60)
Glucose, Bld: 136 mg/dL — ABNORMAL HIGH (ref 70–99)
Potassium: 3.6 mmol/L (ref 3.5–5.1)
Sodium: 130 mmol/L — ABNORMAL LOW (ref 135–145)
Total Bilirubin: 0.5 mg/dL (ref 0.3–1.2)
Total Protein: 7.7 g/dL (ref 6.5–8.1)

## 2022-03-11 LAB — CBC WITH DIFFERENTIAL/PLATELET
Abs Immature Granulocytes: 0.01 10*3/uL (ref 0.00–0.07)
Basophils Absolute: 0 10*3/uL (ref 0.0–0.1)
Basophils Relative: 0 %
Eosinophils Absolute: 0.1 10*3/uL (ref 0.0–0.5)
Eosinophils Relative: 2 %
HCT: 35.2 % — ABNORMAL LOW (ref 36.0–46.0)
Hemoglobin: 11.2 g/dL — ABNORMAL LOW (ref 12.0–15.0)
Immature Granulocytes: 0 %
Lymphocytes Relative: 33 %
Lymphs Abs: 2 10*3/uL (ref 0.7–4.0)
MCH: 25.6 pg — ABNORMAL LOW (ref 26.0–34.0)
MCHC: 31.8 g/dL (ref 30.0–36.0)
MCV: 80.4 fL (ref 80.0–100.0)
Monocytes Absolute: 0.5 10*3/uL (ref 0.1–1.0)
Monocytes Relative: 8 %
Neutro Abs: 3.3 10*3/uL (ref 1.7–7.7)
Neutrophils Relative %: 57 %
Platelets: 219 10*3/uL (ref 150–400)
RBC: 4.38 MIL/uL (ref 3.87–5.11)
RDW: 13.6 % (ref 11.5–15.5)
WBC: 5.9 10*3/uL (ref 4.0–10.5)
nRBC: 0 % (ref 0.0–0.2)

## 2022-03-11 LAB — IRON AND TIBC
Iron: 67 ug/dL (ref 28–170)
Saturation Ratios: 24 % (ref 10.4–31.8)
TIBC: 279 ug/dL (ref 250–450)
UIBC: 212 ug/dL

## 2022-03-11 LAB — FERRITIN: Ferritin: 198 ng/mL (ref 11–307)

## 2022-03-12 LAB — CANCER ANTIGEN 27.29: CA 27.29: 18.1 U/mL (ref 0.0–38.6)

## 2022-03-14 ENCOUNTER — Encounter: Payer: Self-pay | Admitting: Oncology

## 2022-03-14 ENCOUNTER — Inpatient Hospital Stay (HOSPITAL_BASED_OUTPATIENT_CLINIC_OR_DEPARTMENT_OTHER): Payer: Medicare Other | Admitting: Oncology

## 2022-03-14 VITALS — BP 152/68 | HR 91 | Temp 97.4°F | Resp 18 | Wt 153.5 lb

## 2022-03-14 DIAGNOSIS — N183 Chronic kidney disease, stage 3 unspecified: Secondary | ICD-10-CM

## 2022-03-14 DIAGNOSIS — Z1231 Encounter for screening mammogram for malignant neoplasm of breast: Secondary | ICD-10-CM | POA: Diagnosis not present

## 2022-03-14 DIAGNOSIS — Z853 Personal history of malignant neoplasm of breast: Secondary | ICD-10-CM | POA: Diagnosis not present

## 2022-03-14 DIAGNOSIS — N1831 Chronic kidney disease, stage 3a: Secondary | ICD-10-CM

## 2022-03-14 DIAGNOSIS — C50411 Malignant neoplasm of upper-outer quadrant of right female breast: Secondary | ICD-10-CM

## 2022-03-14 DIAGNOSIS — E1122 Type 2 diabetes mellitus with diabetic chronic kidney disease: Secondary | ICD-10-CM

## 2022-03-14 DIAGNOSIS — C50911 Malignant neoplasm of unspecified site of right female breast: Secondary | ICD-10-CM | POA: Diagnosis not present

## 2022-03-14 DIAGNOSIS — Z79811 Long term (current) use of aromatase inhibitors: Secondary | ICD-10-CM | POA: Diagnosis not present

## 2022-03-14 DIAGNOSIS — Z170 Estrogen receptor positive status [ER+]: Secondary | ICD-10-CM

## 2022-03-14 DIAGNOSIS — D631 Anemia in chronic kidney disease: Secondary | ICD-10-CM

## 2022-03-14 MED ORDER — ANASTROZOLE 1 MG PO TABS: 1.0000 mg | ORAL_TABLET | Freq: Every day | ORAL | 2 refills | Status: DC

## 2022-03-14 MED ORDER — ANASTROZOLE 1 MG PO TABS: 1.0000 mg | ORAL_TABLET | Freq: Every day | ORAL | 1 refills | Status: DC

## 2022-03-14 NOTE — Progress Notes (Signed)
Pt here for follow up. No new concerns voiced. No new breast problems  

## 2022-03-14 NOTE — Assessment & Plan Note (Addendum)
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 Treatment if labs meet parameters Continue Arimidex 1 mg daily. CA 27-29 remains stable.  Obtain annual unilateral left breast mammogram.  Bone health Normal bone density in August 2022, continue calcium '1200mg'$  and vitamin D supplementation. Obtain DEXA in August 2024

## 2022-03-14 NOTE — Assessment & Plan Note (Addendum)
Anemia of CKD, also alpha thalassemia trait.  Hemoglobin has improved.  Continue oral ferrous sulfate 334m BID.

## 2022-03-14 NOTE — Progress Notes (Signed)
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 Treatment if labs meet parameters Continue Arimidex 1 mg daily. CA 27-29 remains stable.  Obtain annual unilateral left breast mammogram.  Bone health Normal bone density in August 2022, continue calcium 1271m and vitamin D supplementation. 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  Hemoglobin has improved.  Continue oral ferrous sulfate 3230mBID.   CKD stage 3 due to type 2 diabetes mellitus ([facility] SPEP ordered by nephrology was negative for M protein.   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   Orders Placed This Encounter  Procedures   MM 3D SCREEN BREAST UNI LEFT    Standing Status:   Future    Standing Expiration Date:   03/15/2023    Order Specific Question:   Reason for Exam (SYMPTOM  OR DIAGNOSIS REQUIRED)    Answer:   history of  breast cancer    Order Specific Question:   Preferred imaging location?    Answer:   [facility]   DG Bone Density    Standing Status:   Future    Standing Expiration Date:   03/15/2023    Order Specific Question:   Reason for Exam (SYMPTOM  OR DIAGNOSIS REQUIRED)    Answer:   history of breast cancer    Order Specific Question:   Preferred imaging location?    [facility]   CBC with Differential/Platelet    Standing Status:   Future    Standing Expiration Date:   03/15/2023   Comprehensive metabolic panel    Standing Status:   Future    Standing Expiration Date:   03/14/2023   Iron and TIBC    Standing Status:   Future    Standing Expiration Date:   03/15/2023   Ferritin     Standing Status:   Future    Standing Expiration Date:   03/15/2023   Cancer antigen 27.29    Standing Status:   Future    Standing Expiration Date:   03/15/2023    Return of visit:  September 2024.  All questions were answered. The patient knows to call the clinic with any problems, questions or concerns.  ZhEarlie ServerMD, PhD [facility] 03/14/2022   PERTINENT ONCOLOGY HISTORY KaKATYE HAGYs a  [age].o.  female with PMH listed below was seen in consultation at the request of  ArBurnard HawthorneFNP  for evaluation of abnormal SPEP. Reviewed patient's previous blood work via care everywhere. 08/27/2018, free light chain ratio 1.97, free kappa 79.5, free lambda 40.3. Protein electrophoresis showed increased alpha globulin.  [place] reports history of right breast cancer in 2002 and left breast cancer in 2011.  She underwent right breast lumpectomy followed by adjuvant radiation followed by 5 years of tamoxifen.  Underwent left lumpectomy followed by adjuvant radiation followed by aromatase inhibitor for 5 years.  Currently she is not on any antiestrogen treatment.  Denies any chemotherapy treatment history.  [place] [facility]  [facility] were reviewed.  No pathology is available. 06/06/2017 screening mammogram showed postoperative changes in both breasts.  Architectural distortion has not changed significantly.  Skin thickening is similar.  Calcifications have increased mildly around the postoperative sites bilaterally.  No new dominant mass.   # Chronic history of kidney insufficiency, stage IIIb.  Patient sees Dr. Holley Raring.  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  # Breast exam was performed in seated and lying down position. Right breast lower inner quadrant lumpectomy  scar with focal scar tissue. Left breast upper outer quadrant, 1:00 lumpectomy scar with palpable round thickened breast tissue. No palpable axillary lymphadenopathy.  # #Family history of breast cancer in first-degree relatives, patient reports that she was previously tested negative for BRCA gene mutation.  Results were not available. Patient was referred to genetic counselor and 11/19/2019 [facility] Breast Cancer STAT Panel + Multi- cancer panel found no pathogenic mutations.   # 09/17/2019 bilateral screening mammogram showed right breast mass which warrants further evaluation.  No suspicious findings in left breast. 09/25/2019, right diagnostic breast mammogram showed specialist right breast mass at 10:00. Right axilla is negative for adenopathy.  Patient underwent ultrasound-guided core biopsy of the right breast 10:00 location. 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  Grade 2, no DCIS or LVI.  ER 90%, PR 11-50%, HER-2 negative.  Chronic microcytic anemia, iron panel is not consistent with iron deficiency. Normal hemoglobin evaluation.  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 Hemoglobin stable at 11.6. GI work-up includes EGD and colonoscopy on 06/14/2019.  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  Benign colon polyps.  No high-grade dysplasia or malignancy.  She follows up with Dr. Marius Ditch Anemia can be also secondary to CKD.  # #01/06/2020 patient underwent right mastectomy and sentinel lymph node biopsy.  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  No adjuvant chemotherapy will be offered She had mastectomy with negative margin, no need for adjuvant radiation. Baseline elevated CA 27.29--> post operation normalization of CEA 27.29 February 2020, started on  Arimidex She was seen by Dr. Bary Castilla 04/02/2020 for seroma.  #INTERVAL HISTORY SHIANE REHRER is a 82 y.o. female who has above history reviewed by me today presents for follow up visit for breast cancer.  Patient has no new complaints.  She has manageable side effects from Arimidex. + chronic Joint pain.    Review of Systems  Constitutional:  Negative for appetite change, chills, fatigue and fever.  HENT:   Negative for hearing loss and voice change.   Eyes:  Negative for eye problems.  Respiratory:  Negative for chest tightness and cough.   Cardiovascular:  Negative for chest pain.  Gastrointestinal:  Negative for abdominal distention, abdominal pain and blood in stool.  Endocrine: Positive for hot flashes.  Genitourinary:  Negative for difficulty urinating and frequency.   Musculoskeletal:  Positive for arthralgias.  Skin:  Negative for itching and rash.  Neurological:  Negative for extremity weakness.  Hematological:  Negative for adenopathy.  Psychiatric/Behavioral:  Negative for confusion.    Right mastectomy MEDICAL HISTORY:  Past Medical History:  Diagnosis Date   Anemia    Arthritis    Breast cancer ([place]) 2002   right breast   Breast cancer ([place]) 2011   left  breast   Cancer [facility])    Family history of breast cancer    Family history of lung cancer    Family history of non-Hodgkin's lymphoma    Family history of prostate cancer    Hypertension    Malignant neoplasm of right breast [facility])    Personal history of radiation therapy 2002, 2011   right and left breast   Pre-diabetes     SURGICAL HISTORY: Past Surgical History:  Procedure Laterality Date   ABDOMINAL HYSTERECTOMY  2018   BREAST BIOPSY Right 10/04/2018   u/s bx, vision marker, path pending   BREAST LUMPECTOMY Right 2002   breast ca with rad   BREAST LUMPECTOMY Left 2011   breast ca with rad   BREAST SURGERY  2011,2002   CATARACT EXTRACTION  2015   COLONOSCOPY     COLONOSCOPY WITH PROPOFOL N/A  06/14/2019   Procedure: COLONOSCOPY WITH PROPOFOL;  Surgeon: Lin Landsman, MD;  Location: [place];  Service: Gastroenterology;  Laterality: N/A;   ESOPHAGOGASTRODUODENOSCOPY (EGD) WITH PROPOFOL N/A 06/14/2019   Procedure: ESOPHAGOGASTRODUODENOSCOPY (EGD) WITH PROPOFOL;  Surgeon: Lin Landsman, MD;  Location[facility];  Service: Gastroenterology;  Laterality: N/A;   MASTECTOMY     SIMPLE MASTECTOMY WITH AXILLARY SENTINEL NODE BIOPSY Right 01/06/2020   Procedure: SIMPLE MASTECTOMY WITH AXILLARY SENTINEL NODE BIOPSY;  Surgeon: Robert Bellow, MD;  Location: [facility];  Service: General;  Laterality: Right;    SOCIAL HISTORY: Social History   Socioeconomic History   Marital status: Married    Spouse name: Not on file   Number of children: Not on file   Years of education: Not on file   Highest education level: Not on file  Occupational History   Not on file  Tobacco Use   Smoking status: Former    Packs/day: 1.00    Years: 12.00    Total pack years: 12.00    Types: Cigarettes    Quit date: 03/18/1979    Years since quitting: 43.0   Smokeless tobacco: Never   Tobacco comments:    quit 40 years ago  Vaping Use   Vaping Use: Never used  Substance and Sexual Activity   Alcohol use: Yes    Comment: rarely   Drug use: Never   Sexual activity: Not on file  Other Topics Concern   Not on file  Social History Narrative   From [place]      Retired      Social Determinants of Health   Financial Resource Strain: [facility]  (11/03/2021)   Overall Financial Resource Strain (CARDIA)    Difficulty of Paying Living Expenses: Not hard at all  Food Insecurity: No Food Insecurity (11/03/2021)   Hunger Vital Sign    Worried About Running Out of Food in the Last Year: Never true    Ran Out of Food in the Last Year: Never true  Transportation Needs: No Transportation Needs (11/03/2021)   PRAPARE - Hydrologist (Medical): No    Lack of  Transportation (Non-Medical): No  Physical Activity: Insufficiently Active (11/03/2021)   Exercise Vital Sign    Days of Exercise per Week: 3 days    Minutes of Exercise per Session: 30 min  Stress: No Stress Concern Present (11/03/2021)   [place]    Feeling of Stress : Not at all  Social Connections: Moderately Integrated (11/03/2021)   Social Connection and Isolation Panel [NHANES]  Frequency of Communication with Friends and Family: Three times a week    Frequency of Social Gatherings with Friends and Family: More than three times a week    Attends Religious Services: More than 4 times per year    Active Member of [employer] or Organizations: No    Attends Archivist Meetings: Never    Marital Status: Married  Human resources officer Violence: Not At Risk (11/03/2021)   Humiliation, Afraid, Rape, and Kick questionnaire    Fear of Current or Ex-Partner: No    Emotionally Abused: No    Physically Abused: No    Sexually Abused: No    FAMILY HISTORY: Family History  Problem Relation Age of Onset   Heart attack Mother    Cancer Father    Lung cancer Father    Breast cancer Sister 74       dx again 60   Heart attack Sister    Non-Hodgkin's lymphoma Sister 25   Heart attack Sister    Dementia Sister 10   Breast cancer Paternal Aunt    Breast cancer Maternal Aunt    Prostate cancer Paternal Grandfather        metastic, d. 77s   Colon cancer Neg Hx     ALLERGIES:  has No Known Allergies.  MEDICATIONS:  Current Outpatient Medications  Medication Sig Dispense Refill   acetaminophen (TYLENOL) 500 MG tablet Take 1,000 mg by mouth every 6 (six) hours as needed for moderate pain or headache.     allopurinol (ZYLOPRIM) 100 MG tablet TAKE 1 TABLET(100 MG) BY MOUTH DAILY 90 tablet 3   amLODipine (NORVASC) 2.5 MG tablet Take 2.5 mg by mouth daily.     Calcium Carb-Cholecalciferol (CALCIUM 600 + D PO) Take 1 tablet by  mouth daily.     calcium carbonate (TUMS - DOSED IN MG ELEMENTAL CALCIUM) 500 MG chewable tablet Chew 1,000 mg by mouth daily as needed for indigestion or heartburn.     Carboxymethylcellul-Glycerin (LUBRICATING EYE DROPS OP) Place 1 drop into both eyes daily as needed (irritated eyes).     escitalopram (LEXAPRO) 10 MG tablet Take 1 tablet (10 mg total) by mouth daily. 90 tablet 3   ferrous sulfate 325 (65 FE) MG tablet Take 1 tablet (325 mg total) by mouth 2 (two) times daily with a meal. 60 tablet 0   hydrochlorothiazide (MICROZIDE) 12.5 MG capsule TAKE 1 CAPSULE(12.5 MG) BY MOUTH DAILY 90 capsule 1   losartan (COZAAR) 100 MG tablet TAKE 1 TABLET(100 MG) BY MOUTH DAILY 90 tablet 3   Multiple Vitamins-Minerals (EYE VITAMINS PO) Take 1 tablet by mouth daily.      rosuvastatin (CRESTOR) 40 MG tablet TAKE 1 TABLET BY MOUTH EVERY DAY FOR CHOLESTEROL 90 tablet 0   anastrozole (ARIMIDEX) 1 MG tablet Take 1 tablet (1 mg total) by mouth daily. 90 tablet 2   No current facility-administered medications for this visit.     PHYSICAL EXAMINATION: ECOG PERFORMANCE STATUS: 0 - Asymptomatic Vitals:   03/14/22 1427  BP: (!) 152/68  Pulse: 91  Resp: 18  Temp: (!) 97.4 F (36.3 C)   Filed Weights   03/14/22 1427  Weight: 153 lb 8 oz (69.6 kg)    Physical Exam Constitutional:      General: She is not in acute distress. HENT:     Head: Normocephalic and atraumatic.  Eyes:     General: No scleral icterus. Cardiovascular:     Rate and Rhythm: Normal rate and regular rhythm.  Heart sounds: Normal heart sounds.  Pulmonary:     Effort: Pulmonary effort is normal. No respiratory distress.     Breath sounds: No wheezing.  Abdominal:     General: Bowel sounds are normal. There is no distension.     Palpations: Abdomen is soft.  Musculoskeletal:        General: No deformity. Normal range of motion.     Cervical back: Normal range of motion and neck supple.  Skin:    General: Skin is warm and  dry.     Findings: No erythema or rash.  Neurological:     Mental Status: She is alert and oriented to person, place, and time. Mental status is at baseline.     Cranial Nerves: No cranial nerve deficit.     Coordination: Coordination normal.  Psychiatric:        Mood and Affect: Mood normal.   Breast exam was performed in seated and lying down position. Right breast mastectomy.  Left breast upper outer quadrant, 1:00 lumpectomy scar with palpable round thickened breast tissue.   LABORATORY DATA:  I have reviewed the data as listed     Latest Ref Rng & Units 03/11/2022   12:46 PM 09/07/2021   11:04 AM 03/08/2021   12:55 PM  CBC  WBC 4.0 - 10.5 K/uL 5.9  8.1  8.3   Hemoglobin 12.0 - 15.0 g/dL 11.2  9.9  10.4   Hematocrit 36.0 - 46.0 % 35.2  31.3  33.6   Platelets 150 - 400 K/uL 219  223  214       Latest Ref Rng & Units 03/11/2022   12:46 PM 02/21/2022    9:39 AM 09/07/2021   11:04 AM  CMP  Glucose 70 - 99 mg/dL 136  94  125   BUN 8 - 23 mg/dL 25  24  29   $ Creatinine 0.44 - 1.00 mg/dL 1.19  1.06  1.37   Sodium 135 - 145 mmol/L 130  132  136   Potassium 3.5 - 5.1 mmol/L 3.6  4.7  4.2   Chloride 98 - 111 mmol/L 97  96  102   CO2 22 - 32 mmol/L 24  30  25   $ Calcium 8.9 - 10.3 mg/dL 9.4  10.6  9.8   Total Protein 6.5 - 8.1 g/dL 7.7  7.5  7.6   Total Bilirubin 0.3 - 1.2 mg/dL 0.5  0.6  0.2   Alkaline Phos 38 - 126 U/L 63  68  94   AST 15 - 41 U/L 20  16  19   $ ALT 0 - 44 U/L 14  13  14     $ Iron/TIBC/Ferritin/ %Sat    Component Value Date/Time   IRON 67 03/11/2022 1246   TIBC 279 03/11/2022 1246   FERRITIN 198 03/11/2022 1246   IRONPCTSAT 24 03/11/2022 1246      RADIOGRAPHIC STUDIES: I have personally reviewed the radiological images as listed and agreed with the findings in the report. No results found.

## 2022-03-14 NOTE — Assessment & Plan Note (Signed)
08/27/18 SPEP ordered by nephrology was negative for M protein.   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

## 2022-03-21 ENCOUNTER — Other Ambulatory Visit: Payer: Self-pay | Admitting: Family

## 2022-03-21 DIAGNOSIS — I10 Essential (primary) hypertension: Secondary | ICD-10-CM

## 2022-03-22 ENCOUNTER — Telehealth: Payer: Self-pay

## 2022-03-22 ENCOUNTER — Encounter: Payer: Self-pay | Admitting: Family

## 2022-03-22 ENCOUNTER — Ambulatory Visit (INDEPENDENT_AMBULATORY_CARE_PROVIDER_SITE_OTHER): Payer: Medicare Other | Admitting: Family

## 2022-03-22 VITALS — BP 128/80 | HR 81 | Temp 97.8°F | Ht 61.0 in | Wt 150.8 lb

## 2022-03-22 DIAGNOSIS — N183 Chronic kidney disease, stage 3 unspecified: Secondary | ICD-10-CM

## 2022-03-22 DIAGNOSIS — E785 Hyperlipidemia, unspecified: Secondary | ICD-10-CM | POA: Diagnosis not present

## 2022-03-22 DIAGNOSIS — I10 Essential (primary) hypertension: Secondary | ICD-10-CM | POA: Diagnosis not present

## 2022-03-22 DIAGNOSIS — E1122 Type 2 diabetes mellitus with diabetic chronic kidney disease: Secondary | ICD-10-CM

## 2022-03-22 DIAGNOSIS — E119 Type 2 diabetes mellitus without complications: Secondary | ICD-10-CM

## 2022-03-22 MED ORDER — EZETIMIBE 10 MG PO TABS: 10.0000 mg | ORAL_TABLET | Freq: Every day | ORAL | 3 refills | Status: DC

## 2022-03-22 MED ORDER — HYDROCHLOROTHIAZIDE 12.5 MG PO CAPS: ORAL_CAPSULE | ORAL | 3 refills | Status: DC

## 2022-03-22 MED ORDER — ROSUVASTATIN CALCIUM 40 MG PO TABS: ORAL_TABLET | ORAL | 3 refills | Status: DC

## 2022-03-22 NOTE — Progress Notes (Signed)
Assessment & Plan:  CKD stage 3 due to type 2 diabetes mellitus (HCC) -     Microalbumin / creatinine urine ratio; Future -     AMB Referral to Pharmacy Medication Management  Essential hypertension -     hydroCHLOROthiazide; TAKE 1 CAPSULE(12.5 MG) BY MOUTH DAILY  Dispense: 90 capsule; Refill: 3  Diabetes mellitus without complication [facility]) Assessment & Plan: Lab Results  Component Value Date   HGBA1C 5.7 (A) 02/21/2022   A1c is well-controlled.  Discussed microalbuminemia and recommendation by Dr. Holley Raring to start [place].  Provided patient with Farxiga 10 mg samples today for one month and placed a referral to pharmacy in regards to patient assistance for cost of medication.  Counseled patient on side effects and mechanism of action of [place].  Orders: -     Ezetimibe; Take 1 tablet (10 mg total) by mouth daily.  Dispense: 90 tablet; Refill: 3 -     Microalbumin / creatinine urine ratio; Future -     Hemoglobin A1c; Future -     AMB Referral to Pharmacy Medication Management  Hyperlipidemia, unspecified hyperlipidemia type Assessment & Plan: Lab Results  Component Value Date   LDLCALC 199 (H) 02/21/2022   Uncontrolled.  Discussed increased risk of ASCVD.  LDL goal less than 70.  Continue Crestor 40 mg daily, start Zetia 10 mg daily.  Orders: -     Ezetimibe; Take 1 tablet (10 mg total) by mouth daily.  Dispense: 90 tablet; Refill: 3 -     Lipid panel; Future -     Rosuvastatin Calcium; TAKE 1 TABLET BY MOUTH EVERY DAY FOR CHOLESTEROL  Dispense: 90 tablet; Refill: 3     Return precautions given.   Risks, benefits, and alternatives of the medications and treatment plan prescribed today were discussed, and patient expressed understanding.   Education regarding symptom management and diagnosis given to patient on AVS either electronically or printed.  Return in about 3 months (around 06/20/2022) for Medicare Wellness upcoming or due, schedule.  Tracy Paris,  FNP  Subjective:    Patient ID: Tracy Lawrence, female    DOB: January 17, 1941, 82 y.o.   MRN: [id number]  CC: Tracy Lawrence is a 82 y.o. female who presents today for follow up.   HPI: Feels well today.  No new complaints.  Accompanied by husband.    HLD- compliant with crestor 75m   Follow-up Dr. LHolley Raring1/31/2024 whom started Farxiga 10 mg daily for delaying kidney disease progression.  Continue losartan/HCTZ. He also added amlodipine 2.5 mg daily. Allergies: Patient has no known allergies. Current Outpatient Medications on File Prior to Visit  Medication Sig Dispense Refill   acetaminophen (TYLENOL) 500 MG tablet Take 1,000 mg by mouth every 6 (six) hours as needed for moderate pain or headache.     allopurinol (ZYLOPRIM) 100 MG tablet TAKE 1 TABLET(100 MG) BY MOUTH DAILY 90 tablet 3   amLODipine (NORVASC) 2.5 MG tablet Take 2.5 mg by mouth daily.     anastrozole (ARIMIDEX) 1 MG tablet Take 1 tablet (1 mg total) by mouth daily. 90 tablet 2   Calcium Carb-Cholecalciferol (CALCIUM 600 + D PO) Take 1 tablet by mouth daily.     calcium carbonate (TUMS - DOSED IN MG ELEMENTAL CALCIUM) 500 MG chewable tablet Chew 1,000 mg by mouth daily as needed for indigestion or heartburn.     Carboxymethylcellul-Glycerin (LUBRICATING EYE DROPS OP) Place 1 drop into both eyes daily as needed (irritated eyes).  dapagliflozin propanediol (FARXIGA) 10 MG TABS tablet Take 10 mg by mouth daily.     escitalopram (LEXAPRO) 10 MG tablet Take 1 tablet (10 mg total) by mouth daily. 90 tablet 3   ferrous sulfate 325 (65 FE) MG tablet Take 1 tablet (325 mg total) by mouth 2 (two) times daily with a meal. 60 tablet 0   losartan (COZAAR) 100 MG tablet TAKE 1 TABLET(100 MG) BY MOUTH DAILY 90 tablet 3   Multiple Vitamins-Minerals (EYE VITAMINS PO) Take 1 tablet by mouth daily.      No current facility-administered medications on file prior to visit.    Review of Systems  Constitutional:  Negative for chills and  fever.  Respiratory:  Negative for cough.   Cardiovascular:  Negative for chest pain and palpitations.  Gastrointestinal:  Negative for nausea and vomiting.      Objective:    BP 128/80   Pulse 81   Temp 97.8 F (36.6 C) (Oral)   Ht 5' 1"$  (1.549 m)   Wt 150 lb 12.8 oz (68.4 kg)   LMP  (LMP Unknown)   SpO2 99%   BMI 28.49 kg/m  BP Readings from Last 3 Encounters:  03/22/22 128/80  03/14/22 (!) 152/68  02/21/22 136/82   Wt Readings from Last 3 Encounters:  03/22/22 150 lb 12.8 oz (68.4 kg)  03/14/22 153 lb 8 oz (69.6 kg)  02/21/22 150 lb (68 kg)    Physical Exam Vitals reviewed.  Constitutional:      Appearance: She is well-developed.  Eyes:     Conjunctiva/sclera: Conjunctivae normal.  Cardiovascular:     Rate and Rhythm: Normal rate and regular rhythm.     Pulses: Normal pulses.     Heart sounds: Normal heart sounds.  Pulmonary:     Effort: Pulmonary effort is normal.     Breath sounds: Normal breath sounds. No wheezing, rhonchi or rales.  Skin:    General: Skin is warm and dry.  Neurological:     Mental Status: She is alert.  Psychiatric:        Speech: Speech normal.        Behavior: Behavior normal.        Thought Content: Thought content normal.

## 2022-03-22 NOTE — Assessment & Plan Note (Signed)
Lab Results  Component Value Date   HGBA1C 5.7 (A) 02/21/2022   A1c is well-controlled.  Discussed microalbuminemia and recommendation by Dr. Holley Raring to start [place].  Provided patient with Farxiga 10 mg samples today for one month and placed a referral to pharmacy in regards to patient assistance for cost of medication.  Counseled patient on side effects and mechanism of action of [place].

## 2022-03-22 NOTE — Progress Notes (Signed)
   Care Guide Note  03/22/2022 Name: TREYANNA TOREN MRN: [id number] DOB: 20-Aug-1940  Referred by: Burnard Hawthorne, FNP Reason for referral : Care Coordination (Outreach to schedule with pharm d cheryl )   AMESHA SAULS is a 82 y.o. year old female who is a primary care patient of Burnard Hawthorne, FNP. SHAINDY STAVIG was referred to the pharmacist for assistance related to DM.    An unsuccessful telephone outreach was attempted today to contact the patient who was referred to the pharmacy team for assistance with medication management. Additional attempts will be made to contact the patient.   Noreene Larsson, [place], [place] [address] Direct Dial: [phone] [email or web address]

## 2022-03-22 NOTE — Patient Instructions (Signed)
In addition to Crestor 40 mg, I have started Zetia 10 mg to further lower your LDL cholesterol.  We have provided you with a sample of Farxiga 10 mg as prescribed Dr. Holley Raring.    I placed a referral to our pharmacist to further assist [place] in getting this medication more affordable.  You will get a call to schedule an appointment with her   please let me know if you require more samples.  Dapagliflozin Tablets What is this medication? DAPAGLIFLOZIN (DAP a gli FLOE zin) treats type 2 diabetes. It works by helping your kidneys remove sugar (glucose) from your blood through the urine, which decreases your blood sugar. It may also be used to lower the risk of worsening disease and death caused by kidney disease and heart failure. It works by helping your kidneys remove salt (sodium) from your blood through the urine. This decreases the amount of work the kidneys and heart have to do. Changes to diet and exercise are often combined with this medication. This medicine may be used for other purposes; ask your health care provider or pharmacist if you have questions. COMMON BRAND NAME(S): Wilder Glade What should I tell my care team before I take this medication? They need to know if you have any of these conditions: Dehydration Diabetic ketoacidosis Diet low in salt Eating less due to illness, surgery, dieting, or any other reason Frequently drink alcohol Having surgery History of pancreatitis or pancreas problems History of yeast infection of the penis or vagina Infection in the bladder, kidneys, or urinary tract Kidney disease Low blood pressure On dialysis Problems urinating Type 1 diabetes Uncircumcised female An unusual or allergic reaction to dapagliflozin, other medications, foods, dyes, or preservatives Pregnant or trying to get pregnant Breast-feeding How should I use this medication? Take this medication by mouth with water. Take it as directed on the prescription label at the same time  every day. You can take it with or without food. If it upsets your stomach, take it with food. Keep taking it unless your care team tells you to stop. A special MedGuide will be given to you by the pharmacist with each prescription and refill. Be sure to read this information carefully each time. Talk to your care team about the use of this medication in children. Special care may be needed. Overdosage: If you think you have taken too much of this medicine contact a poison control center or emergency room at once. NOTE: This medicine is only for you. Do not share this medicine with others. What if I miss a dose? If you miss a dose, take it as soon as you can. If it is almost time for your next dose, take only that dose. Do not take double or extra doses. What may interact with this medication? Lithium Sulfonylureas, such as glimepiride, glipizide, glyburide This list may not describe all possible interactions. Give your health care provider a list of all the medicines, herbs, non-prescription drugs, or dietary supplements you use. Also tell them if you smoke, drink alcohol, or use illegal drugs. Some items may interact with your medicine. What should I watch for while using this medication? Visit your care team for regular checks on your progress. Tell your care team if your symptoms do not start to get better or if they get worse. This medication can cause a serious condition in which there is too much acid in the blood. If you develop nausea, vomiting, stomach pain, unusual tiredness, or breathing problems, stop taking  this medication and call your care team right away. If possible, use a ketone dipstick to check for ketones in your urine. Check with your care team if you have severe diarrhea, nausea, and vomiting, or if you sweat a lot. The loss of too much body fluid may make it dangerous for you to take this medication. A test called the HbA1C (A1C) will be monitored. This is a simple blood test.  It measures your blood sugar control over the last 2 to 3 months. You will receive this test every 3 to 6 months. Learn how to check your blood sugar. Learn the symptoms of low and high blood sugar and how to manage them. Always carry a quick-source of sugar with you in case you have symptoms of low blood sugar. Examples include hard sugar candy or glucose tablets. Make sure others know that you can choke if you eat or drink when you develop serious symptoms of low blood sugar, such as seizures or unconsciousness. Get medical help at once. Tell your care team if you have high blood sugar. You might need to change the dose of your medication. If you are sick or exercising more than usual, you may need to change the dose of your medication. Do not skip meals. Ask your care team if you should avoid alcohol. Many nonprescription cough and cold products contain sugar or alcohol. These can affect blood sugar. Wear a medical ID bracelet or chain. Carry a card that describes your condition. List the medications and doses you take on the card. What side effects may I notice from receiving this medication? Side effects that you should report to your care team as soon as possible: Allergic reactions--skin rash, itching, hives, swelling of the face, lips, tongue, or throat Dehydration--increased thirst, dry mouth, feeling faint or lightheaded, headache, dark yellow or brown urine Diabetic ketoacidosis (DKA)--increased thirst or amount of urine, dry mouth, fatigue, fruity odor to breath, trouble breathing, stomach pain, nausea, vomiting Genital yeast infection--redness, swelling, pain, or itchiness, odor, thick or lumpy discharge New pain or tenderness, change in skin color, sores or ulcers, infection of the leg or foot Infection or redness, swelling, tenderness, or pain in the genitals, or area from the genitals to the back of the rectum Urinary tract infection (UTI)--burning when passing urine, passing frequent  small amounts of urine, bloody or cloudy urine, pain in the lower back or sides This list may not describe all possible side effects. Call your doctor for medical advice about side effects. You may report side effects to FDA at 1-800-FDA-1088. Where should I keep my medication? Keep out of the reach of children and pets. Store at room temperature between 20 and 25 degrees C (68 and 77 degrees F). Get rid of any unused medication after the expiration date. To get rid of medications that are no longer needed or have expired: Take the medication to a medication take-back program. Check with your pharmacy or law enforcement to find a location. If you cannot return the medication, check the label or package insert to see if the medication should be thrown out in the garbage or flushed down the toilet. If you are not sure, ask your care team. If it is safe to put it in the trash, take the medication out of the container. Mix the medication with cat litter, dirt, coffee grounds, or other unwanted substance. Seal the mixture in a bag or container. Put it in the trash. NOTE: This sheet is a summary. It  may not cover all possible information. If you have questions about this medicine, talk to your doctor, pharmacist, or health care provider.  2023 Elsevier/Gold Standard (2020-04-01 00:00:00)

## 2022-03-22 NOTE — Assessment & Plan Note (Signed)
Lab Results  Component Value Date   LDLCALC 199 (H) 02/21/2022   Uncontrolled.  Discussed increased risk of ASCVD.  LDL goal less than 70.  Continue Crestor 40 mg daily, start Zetia 10 mg daily.

## 2022-03-25 NOTE — Progress Notes (Signed)
   Care Guide Note  03/25/2022 Name: Tracy Lawrence MRN: [id number] DOB: 09/06/1940  Referred by: Burnard Hawthorne, FNP Reason for referral : Care Coordination (Outreach to schedule with pharm d cheryl )   Tracy Lawrence is a 82 y.o. year old female who is a primary care patient of Burnard Hawthorne, FNP. Tracy Lawrence was referred to the pharmacist for assistance related to HTN.    Successful contact was made with the patient to discuss pharmacy services including being ready for the pharmacist to call at least 5 minutes before the scheduled appointment time, to have medication bottles and any blood sugar or blood pressure readings ready for review. The patient agreed to meet with the pharmacist via with the pharmacist via telephone visit on (date/time).  03/29/2022  Noreene Larsson, [place], [place] [address] Direct Dial: [phone] [email or web address]

## 2022-03-28 ENCOUNTER — Ambulatory Visit: Payer: Medicare Other | Attending: Family

## 2022-03-28 DIAGNOSIS — M6281 Muscle weakness (generalized): Secondary | ICD-10-CM | POA: Diagnosis present

## 2022-03-28 DIAGNOSIS — R29898 Other symptoms and signs involving the musculoskeletal system: Secondary | ICD-10-CM | POA: Insufficient documentation

## 2022-03-28 DIAGNOSIS — R42 Dizziness and giddiness: Secondary | ICD-10-CM | POA: Diagnosis present

## 2022-03-28 DIAGNOSIS — R2681 Unsteadiness on feet: Secondary | ICD-10-CM | POA: Insufficient documentation

## 2022-03-28 NOTE — Therapy (Signed)
[facility] [address], [place], [address] Phone: [phone]   Fax:  [phone]  Physical Therapy Evaluation  Patient Details  Name: DEVYNN FUKUI MRN: [id number] Date of Birth: 1940/07/07 Referring Provider (PT): Burnard Hawthorne, FNP   Encounter Date: 03/28/2022   PT End of Session - 03/28/22 1014     Visit Number 1    Number of Visits 17    Date for PT Re-Evaluation 05/26/22    PT Start Time [id number]    PT Stop Time 1056    PT Time Calculation (min) 42 min    Activity Tolerance Patient tolerated treatment well    Behavior During Therapy [facility] for tasks assessed/performed             Past Medical History:  Diagnosis Date   Anemia    Arthritis    Breast cancer ([place]) 2002   right breast   Breast cancer ([place]) 2011   left breast   Cancer ([place])    Family history of breast cancer    Family history of lung cancer    Family history of non-Hodgkin's lymphoma    Family history of prostate cancer    Hypertension    Malignant neoplasm of right breast [facility])    Personal history of radiation therapy 2002, 2011   right and left breast   Pre-diabetes     Past Surgical History:  Procedure Laterality Date   ABDOMINAL HYSTERECTOMY  2018   BREAST BIOPSY Right 10/04/2018   u/s bx, vision marker, path pending   BREAST LUMPECTOMY Right 2002   breast ca with rad   BREAST LUMPECTOMY Left 2011   breast ca with rad   BREAST SURGERY  2011,2002   CATARACT EXTRACTION  2015   COLONOSCOPY     COLONOSCOPY WITH PROPOFOL N/A 06/14/2019   Procedure: COLONOSCOPY WITH PROPOFOL;  Surgeon: Lin Landsman, MD;  Location: [facility] ENDOSCOPY;  Service: Gastroenterology;  Laterality: N/A;   ESOPHAGOGASTRODUODENOSCOPY (EGD) WITH PROPOFOL N/A 06/14/2019   Procedure: ESOPHAGOGASTRODUODENOSCOPY (EGD) WITH PROPOFOL;  Surgeon: Lin Landsman, MD;  Location: [facility] ENDOSCOPY;  Service: Gastroenterology;  Laterality: N/A;    MASTECTOMY     SIMPLE MASTECTOMY WITH AXILLARY SENTINEL NODE BIOPSY Right 01/06/2020   Procedure: SIMPLE MASTECTOMY WITH AXILLARY SENTINEL NODE BIOPSY;  Surgeon: Robert Bellow, MD;  Location: [facility] ORS;  Service: General;  Laterality: Right;    There were no vitals filed for this visit.    Subjective Assessment - 03/28/22 1024     Subjective Neck: 7/10 currently, 10/10 at worst for the past 3 months.    Pertinent History LE weakness, balance. LOB x 1 in the past 6 months. Did not fall down, fell back while walking down bleachers. Feels like her balance is off at times. Occurred around the end of December 2023 while at a basketball game. It was instantaneous, feels like she was just thrown off. Pt was in a hurry going out of [employer] and tripped on one of the speed bumps in the parking lot.  States L LE feels more weak than the R side. Has had difficulty with balance for about a year. Room feels like it is spinning around when she first gets up out of bed in the morning, has to sit for about 2 minutes before getting up. Turning real fast makes the room feel like it is spinning. Going up stairs is fine but feels insecure going down steps.  Pt also  states having neck pain. Going down steps and balance are her PT problems.    Patient Stated Goals Improve L LE strength and balance.    Currently in Pain? Yes    Pain Score 7     Pain Location Neck    Aggravating Factors  Balance: going down steps > up                [facility] PT Assessment - 03/28/22 1016       Assessment   Medical Diagnosis R29.898 (ICD-10-CM) - Weakness of both lower extremities    Referring Provider (PT) Burnard Hawthorne, FNP    Onset Date/Surgical Date 02/21/22      Precautions   Precaution Comments fall risk      Restrictions   Other Position/Activity Restrictions No known restrictions      Balance Screen   Has the patient fallen in the past 6 months Yes    How many times? 1   Did not fall down, fell back while  walking down bleachers. Feels like her balance is off at times. Occurred around the end of December 2023 while at a basketball game.   Has the patient had a decrease in activity level because of a fear of falling?  No   Just more carefull and more aware of it.   Is the patient reluctant to leave their home because of a fear of falling?  No      Home Environment   Additional Comments Pt lives in a 1 story home with husband. 6 steps to enter from garage R rail.      Strength   Right Hip Flexion 4-/5    Right Hip Extension 4-/5    Right Hip ABduction 4/5    Left Hip Flexion 4/5    Left Hip Extension 3+/5    Left Hip ABduction 4-/5    Right Knee Flexion 5/5    Right Knee Extension 5/5    Left Knee Flexion 4+/5    Left Knee Extension 5/5      Dynamic Gait Index   Level Surface Normal    Change in Gait Speed Normal    Gait with Horizontal Head Turns Mild Impairment    Gait with Vertical Head Turns Mild Impairment   unsteadiness when looking up   Gait and Pivot Turn Mild Impairment   unsteadiness with R turn > L   Step Over Obstacle Mild Impairment    Step Around Obstacles Normal    Steps Mild Impairment   leans back when descending stairs.   Total Score 19    DGI comment: < 19 suggests increased fall risk                        Objective measurements completed on examination: See above findings.   R29.898 (ICD-10-CM) - Weakness of both lower extremities  Burnard Hawthorne, FNP 02/21/2022  LOB x 1 in the past 6 months. Did not fall down, fell back while walking down bleachers. Feels like her balance is off at times. Occurred around the end of December 2023 while at a basketball game. It was instantaneous, feels like she was just thrown off. Pt was in a hurry going out of [employer] and tripped on one of the speed bumps in the parking lot.  States L LE feels more weak than the R side. Has had difficulty with balance for about a year. Room feels like it is spinning around  when she first gets up out of bed in the morning, has to sit for about 2 minutes before getting up. Turning real fast makes the room feel like it is spinning. Going up stairs is fine but feels insecure going down steps.        Dix Hallpike  (+) L side (15 seconds to stop symptoms)  (-) R  VBI test  (-) L (slight dizziness but subsided; possible vertigo)  Inconclusive R. Slight dizziness but improves very slowly.    Supine sit to the L. Dizziness which eases off gradually.    Work on [employer] for L ear next session.     Response to treatment Pt tolerated session well without aggravation of symptoms.    Clinical impression Pt is a 82 year old female who came to physical therapy secondary to decrease balance as well as B LE weakness. She currently presents with positive Marye Round for L ear, decreased steadiness with gait during head turns suggesting inner ear involvement, as well as B hip weakness and difficulty negotiating stairs. Pt will benefit from skilled physical therapy services to address the aforementioned deficits and to decrease fall risk and improve function.                      PT Education - 03/28/22 1326     Education Details POC    Person(s) Educated Patient    Methods Explanation    Comprehension Verbalized understanding              PT Short Term Goals - 03/28/22 1310       PT SHORT TERM GOAL #1   Title Pt will be independent with her initial HEP to improve strength, balance, ability to perform supine <> sit transfers, as well as ambulate while turning her head with less dizziness.    Time 3    Period Weeks    Status New    Target Date 04/21/22               PT Long Term Goals - 03/28/22 1316       PT LONG TERM GOAL #1   Title Pt will demonstrate negative Marye Round for L ear to promote ability to perform supine <> sit transfers as well as ambulate while looking around with decreased dizziness and improved balance.     Baseline (+) [employer] pike on L ear with reproduction of symptoms (03/28/2022)    Time 8    Period Weeks    Status New    Target Date 05/26/22      PT LONG TERM GOAL #2   Title Pt will improve bilateral hip extension and abduction strength by at least 1/2 MMT to promote ability to negotiate stairs with less difficulty.    Baseline hip extension 4-/5 R, 3+/5 L, hip abduction 4/5 R, 4-/5 L (03/28/2022)    Time 8    Period Weeks    Status New    Target Date 05/26/22      PT LONG TERM GOAL #3   Title Pt will improve her DGI score by at least 3 points as a demonstration of improved balance.    Baseline DGI: 19 (03/28/2022)    Time 8    Period Weeks    Status New    Target Date 05/26/22                    Plan - 03/28/22 1258  Clinical Impression Statement Pt is a 82 year old female who came to physical therapy secondary to decrease balance as well as B LE weakness. She currently presents with positive Marye Round for L ear, decreased steadiness with gait during head turns suggesting inner ear involvement, as well as B hip weakness and difficulty negotiating stairs. Pt will benefit from skilled physical therapy services to address the aforementioned deficits and to decrease fall risk and improve function.    Personal Factors and Comorbidities Comorbidity 2;Age;Past/Current Experience;Fitness;Time since onset of injury/illness/exacerbation    Comorbidities Arthritis, CA    Examination-Activity Limitations Stairs;Locomotion Level    Stability/Clinical Decision Making Stable/Uncomplicated    Clinical Decision Making Low    Rehab Potential Fair    PT Frequency 2x / week    PT Duration 8 weeks    PT Treatment/Interventions Therapeutic activities;Therapeutic exercise;Neuromuscular re-education;Balance training;Manual techniques;Canalith Repostioning;Electrical Stimulation;Stair training;Patient/family education;Dry needling    PT Next Visit Plan Epley Maneuver, hip and knee  strengthening, stair negotiation, manual technques, modalities PRN    Consulted and Agree with Plan of Care Patient             Patient will benefit from skilled therapeutic intervention in order to improve the following deficits and impairments:  Improper body mechanics, Decreased strength, Dizziness, Decreased balance  Visit Diagnosis: Dizziness and giddiness - Plan: PT plan of care cert/re-cert  Unsteadiness on feet - Plan: PT plan of care cert/re-cert  Muscle weakness (generalized) - Plan: PT plan of care cert/re-cert     Problem List Patient Active Problem List   Diagnosis Date Noted   Lower extremity weakness 02/21/2022   Cutaneous abscess of trunk 10/16/2021   Diabetes mellitus without complication ([place]) [id number]   Radiation effect, initial encounter 12/10/2019   Long term (current) use of aspirin 12/09/2019   Genetic testing 11/20/2019   Family history of breast cancer    Family history of prostate cancer    Family history of lung cancer    Family history of non-Hodgkin's lymphoma    Malignant neoplasm of right breast ([place])    Localized swelling of left foot 09/27/2019   Breast mass, right 09/27/2019   Positive FIT (fecal immunochemical test)    Anemia in chronic kidney disease (CKD) 03/13/2019   CKD stage 3 due to type 2 diabetes mellitus ([place]) 10/22/2018   Depressive disorder 07/02/2018   HLD (hyperlipidemia) 06/29/2018   Essential hypertension 06/29/2018   Joneen Boers PT, DPT  03/28/2022, 1:37 PM  [facility] [address] [place], [address] Phone: [phone]   Fax:  [phone]  Name: TARANEH WHITSETT MRN: [id number] Date of Birth: [birth year]

## 2022-03-29 ENCOUNTER — Other Ambulatory Visit: Payer: Medicare Other

## 2022-03-29 NOTE — Progress Notes (Signed)
   03/29/2022  Patient ID: Tracy Lawrence, female   DOB: 1940/11/05, 82 y.o.   MRN: [id number]  Subjective/Objective: -Patient referred to pharmacy for medication assistance with Farxiga 15m tablets because medication was going to cost her $400 out of pocket  DM -Recent clinic A1c 5.7, glucose 126 -Patient does not monitor regularly at home -Current regimen:  Farxiga 138mdaily.  Patient was give 30 day sample from PCP on 2/20, so she has approximately 3 week supply remaining HTN -Recent clinic BP 128/80 -Does not regularly monitor at home -Current regimen: amlodipine 2.15m81maily, losartan 100m79mily, Hctz 12.15mg 64mly -Of note: diagnosis of gout, which hctz can increase uric acid levels and promote flare; but patient reports no gout flares in past 2 years.  Last uric acid level was wnl. HLD -Recent TC and LDL were elevated- TC 280, LDL 199 -Current regimen:  Rosuvastatin 40mg 18my and Ezetimibe 10mg d4m added 03/22/22 Medication Management and Adherence -Patient states good adherence, rarely missing medication doses -Did inquire about stopping allopurinol since she has not had a gout attack in at least 2 years -No barriers in regard to access or affordability outside of FarxigaIransment/Plan: DM -Based on preliminary assessment, patient will qualify for medication assistance for Farxiga; forwarding chart to that team to start application process HTN -Continue current regimen HLD -Reevaluate labs at July follow-up with PCP; could consider addition of PCSK9 inhibitor if still not at LDL goal Medication Management and Adherence -Suggested continuing allopurinol, as this is a preventative measure and likely why there have been no gout flares recently   Follow-Up:  None needed at this time, but patient has my contact information if any needs arise.  Kaenan Jake Darlina GuysD, DPLA

## 2022-03-30 ENCOUNTER — Other Ambulatory Visit (HOSPITAL_COMMUNITY): Payer: Self-pay

## 2022-04-07 ENCOUNTER — Ambulatory Visit: Payer: Medicare Other | Attending: Family

## 2022-04-07 DIAGNOSIS — R2681 Unsteadiness on feet: Secondary | ICD-10-CM | POA: Diagnosis present

## 2022-04-07 DIAGNOSIS — R42 Dizziness and giddiness: Secondary | ICD-10-CM | POA: Insufficient documentation

## 2022-04-07 DIAGNOSIS — M6281 Muscle weakness (generalized): Secondary | ICD-10-CM | POA: Diagnosis present

## 2022-04-07 NOTE — Therapy (Signed)
OUTPATIENT PHYSICAL THERAPY TREATMENT NOTE   Patient Name: Tracy Lawrence MRN: [id number] DOB:October 10, 1940, 82 y.o., female Today's Date: 04/07/2022  PCP: Burnard Hawthorne, FNP  REFERRING PROVIDER: Burnard Hawthorne, FNP   END OF SESSION:  PT End of Session - 04/07/22 0803     Visit Number 2    Number of Visits 17    Date for PT Re-Evaluation 05/26/22    PT Start Time 0803    PT Stop Time 0845    PT Time Calculation (min) 42 min    Activity Tolerance Patient tolerated treatment well    Behavior During Therapy [facility] for tasks assessed/performed             Past Medical History:  Diagnosis Date   Anemia    Arthritis    Breast cancer ([place]) 2002   right breast   Breast cancer ([place]) 2011   left breast   Cancer ([place])    Family history of breast cancer    Family history of lung cancer    Family history of non-Hodgkin's lymphoma    Family history of prostate cancer    Hypertension    Malignant neoplasm of right breast [facility])    Personal history of radiation therapy 2002, 2011   right and left breast   Pre-diabetes    Past Surgical History:  Procedure Laterality Date   ABDOMINAL HYSTERECTOMY  2018   BREAST BIOPSY Right 10/04/2018   u/s bx, vision marker, path pending   BREAST LUMPECTOMY Right 2002   breast ca with rad   BREAST LUMPECTOMY Left 2011   breast ca with rad   BREAST SURGERY  2011,2002   CATARACT EXTRACTION  2015   COLONOSCOPY     COLONOSCOPY WITH PROPOFOL N/A 06/14/2019   Procedure: COLONOSCOPY WITH PROPOFOL;  Surgeon: Lin Landsman, MD;  Location: [facility] ENDOSCOPY;  Service: Gastroenterology;  Laterality: N/A;   ESOPHAGOGASTRODUODENOSCOPY (EGD) WITH PROPOFOL N/A 06/14/2019   Procedure: ESOPHAGOGASTRODUODENOSCOPY (EGD) WITH PROPOFOL;  Surgeon: Lin Landsman, MD;  Location: [facility] ENDOSCOPY;  Service: Gastroenterology;  Laterality: N/A;   MASTECTOMY     SIMPLE MASTECTOMY WITH AXILLARY SENTINEL NODE BIOPSY Right 01/06/2020   Procedure: SIMPLE  MASTECTOMY WITH AXILLARY SENTINEL NODE BIOPSY;  Surgeon: Robert Bellow, MD;  Location: [facility] ORS;  Service: General;  Laterality: Right;   Patient Active Problem List   Diagnosis Date Noted   Lower extremity weakness 02/21/2022   Cutaneous abscess of trunk 10/16/2021   Diabetes mellitus without complication ([place]) [id number]   Radiation effect, initial encounter 12/10/2019   Long term (current) use of aspirin 12/09/2019   Genetic testing 11/20/2019   Family history of breast cancer    Family history of prostate cancer    Family history of lung cancer    Family history of non-Hodgkin's lymphoma    Malignant neoplasm of right breast ([place])    Localized swelling of left foot 09/27/2019   Breast mass, right 09/27/2019   Positive FIT (fecal immunochemical test)    Anemia in chronic kidney disease (CKD) 03/13/2019   CKD stage 3 due to type 2 diabetes mellitus ([place]) 10/22/2018   Depressive disorder 07/02/2018   HLD (hyperlipidemia) 06/29/2018   Essential hypertension 06/29/2018    REFERRING DIAG: R29.898 (ICD-10-CM) - Weakness of both lower extremities   THERAPY DIAG:  Dizziness and giddiness  Unsteadiness on feet  Muscle weakness (generalized)  Rationale for Evaluation and Treatment Rehabilitation  PERTINENT HISTORY: LE weakness, balance. LOB x 1 in the past  6 months. Did not fall down, fell back while walking down bleachers. Feels like her balance is off at times. Occurred around the end of December 2023 while at a basketball game. It was instantaneous, feels like she was just thrown off. Pt was in a hurry going out of [employer] and tripped on one of the speed bumps in the parking lot. States L LE feels more weak than the R side. Has had difficulty with balance for about a year. Room feels like it is spinning around when she first gets up out of bed in the morning, has to sit for about 2 minutes before getting up. Turning real fast makes the room feel like it is spinning. Going up  stairs is fine but feels insecure going down steps. Pt also states having neck pain. Going down steps and balance are her PT problems.   PRECAUTIONS: fall risk  SUBJECTIVE:   SUBJECTIVE STATEMENT: Dizziness is still there  PAIN:  Are you having pain? Neck hurts, gets used to it. Other than that, not too bad.    TODAY'S TREATMENT:                                                                                                                                         DATE: 04/07/2022  Marye Round             (+) L side (15 seconds to stop symptoms)             (-) R   VBI test             (-) L (slight dizziness but subsided; possible vertigo)             Inconclusive R. Slight dizziness but improves very slowly.      Supine sit to the L. Dizziness which eases off gradually.     Canalith Repositioning    Epley for L ear   Dizziness for 1.5 minutes during first position on first try, dizziness for about 15 seconds sitting up during last position on first try.    Dizziness for 30 seconds during first position on 2nd try, dizziness for about 10 seconds during last position on 2nd try.    Dizziness for about 10-15 seconds during first positoin on 3rd try, no dizziness during last positoin during 3rd try.    Dizziness for about 45 seconds during first positoin on 4th try, no dizziness during last positoin during 4th try.      Therapeutic exercise   Seated B scapular retraction 10x3 with 5 second holds   Seated chin tucks 10x5 seconds for 2 sets  Improved exercise technique, movement at target joints, use of target muscles after mod verbal, visual, tactile cues.       Response to treatment Pt tolerated session well without aggravation of symptoms.      Clinical impression Worked on canalith  repositioning for L ear today to improve balance. Overall demonstrated decreased dizziness after treatment. Provided Epley maneuver for L ear as part of her HEP. Also worked on  scapular retraction and chin tucks to promote thoracic extension to help decrease neck stiffness if cervicogenic dizziness also plays a factor to her balance difficulties. Pt tolerated session well without aggravation of symptoms. Pt will benefit from continued skilled physical therapy services to improve strength, balance, and function.       PATIENT EDUCATION: Education details: there-ex, HEP Person educated: Patient Education method: Explanation, Demonstration, Tactile cues, Verbal cues, and Handouts Education comprehension: verbalized understanding and returned demonstration  HOME EXERCISE PROGRAM: Access Code: [email or web address] URL: [email or web address] Date: 04/07/2022 Prepared by: Joneen Boers  Exercises - Self-Epley Maneuver Left Ear  - 1 x daily - 7 x weekly - 3 sets - 10 reps - Seated Scapular Retraction  - 3 x daily - 7 x weekly - 3 sets - 10 reps - 5 seconds hold   PT Short Term Goals - 03/28/22 1310       PT SHORT TERM GOAL #1   Title Pt will be independent with her initial HEP to improve strength, balance, ability to perform supine <> sit transfers, as well as ambulate while turning her head with less dizziness.    Time 3    Period Weeks    Status New    Target Date 04/21/22              PT Long Term Goals - 03/28/22 1316       PT LONG TERM GOAL #1   Title Pt will demonstrate negative Marye Round for L ear to promote ability to perform supine <> sit transfers as well as ambulate while looking around with decreased dizziness and improved balance.    Baseline (+) [employer] pike on L ear with reproduction of symptoms (03/28/2022)    Time 8    Period Weeks    Status New    Target Date 05/26/22      PT LONG TERM GOAL #2   Title Pt will improve bilateral hip extension and abduction strength by at least 1/2 MMT to promote ability to negotiate stairs with less difficulty.    Baseline hip extension 4-/5 R, 3+/5 L, hip abduction 4/5 R, 4-/5 L (03/28/2022)     Time 8    Period Weeks    Status New    Target Date 05/26/22      PT LONG TERM GOAL #3   Title Pt will improve her DGI score by at least 3 points as a demonstration of improved balance.    Baseline DGI: 19 (03/28/2022)    Time 8    Period Weeks    Status New    Target Date 05/26/22              Plan - 04/07/22 0801     Clinical Impression Statement Worked on canalith repositioning for L ear today to improve balance. Overall demonstrated decreased dizziness after treatment. Provided Epley maneuver for L ear as part of her HEP. Also worked on scapular retraction and chin tucks to promote thoracic extension to help decrease neck stiffness if cervicogenic dizziness also plays a factor to her balance difficulties. Pt tolerated session well without aggravation of symptoms. Pt will benefit from continued skilled physical therapy services to improve strength, balance, and function.    Personal Factors and Comorbidities Comorbidity 2;Age;Past/Current Experience;Fitness;Time since onset of injury/illness/exacerbation    Comorbidities Arthritis,  CA    Examination-Activity Limitations Stairs;Locomotion Level    Stability/Clinical Decision Making Stable/Uncomplicated    Rehab Potential Fair    PT Frequency 2x / week    PT Duration 8 weeks    PT Treatment/Interventions Therapeutic activities;Therapeutic exercise;Neuromuscular re-education;Balance training;Manual techniques;Canalith Repostioning;Electrical Stimulation;Stair training;Patient/family education;Dry needling    PT Next Visit Plan Epley Maneuver, hip and knee strengthening, stair negotiation, manual technques, modalities PRN    Consulted and Agree with Plan of Care Patient              Joneen Boers PT, DPT  04/07/2022, 10:39 AM

## 2022-04-11 ENCOUNTER — Telehealth: Payer: Self-pay

## 2022-04-11 NOTE — Telephone Encounter (Signed)
Sent pt portion for PAP for Farx[employer] 04/04/2022

## 2022-04-13 ENCOUNTER — Ambulatory Visit: Payer: Medicare Other

## 2022-04-13 DIAGNOSIS — R42 Dizziness and giddiness: Secondary | ICD-10-CM | POA: Diagnosis not present

## 2022-04-13 DIAGNOSIS — M6281 Muscle weakness (generalized): Secondary | ICD-10-CM

## 2022-04-13 DIAGNOSIS — R2681 Unsteadiness on feet: Secondary | ICD-10-CM

## 2022-04-13 NOTE — Therapy (Signed)
OUTPATIENT PHYSICAL THERAPY TREATMENT NOTE   Patient Name: Tracy Lawrence MRN: [id number] DOB:[birth year], 82 y.o., female Today's Date: 04/13/2022  PCP: Burnard Hawthorne, FNP  REFERRING PROVIDER: Burnard Hawthorne, FNP   END OF SESSION:  PT End of Session - 04/13/22 0848     Visit Number 3    Number of Visits 17    Date for PT Re-Evaluation 05/26/22    PT Start Time 0846    PT Stop Time 0926    PT Time Calculation (min) 40 min    Activity Tolerance Patient tolerated treatment well    Behavior During Therapy [facility] for tasks assessed/performed             Past Medical History:  Diagnosis Date   Anemia    Arthritis    Breast cancer ([place]) 2002   right breast   Breast cancer ([place]) 2011   left breast   Cancer ([place])    Family history of breast cancer    Family history of lung cancer    Family history of non-Hodgkin's lymphoma    Family history of prostate cancer    Hypertension    Malignant neoplasm of right breast [facility])    Personal history of radiation therapy 2002, 2011   right and left breast   Pre-diabetes    Past Surgical History:  Procedure Laterality Date   ABDOMINAL HYSTERECTOMY  2018   BREAST BIOPSY Right 10/04/2018   u/s bx, vision marker, path pending   BREAST LUMPECTOMY Right 2002   breast ca with rad   BREAST LUMPECTOMY Left 2011   breast ca with rad   BREAST SURGERY  2011,2002   CATARACT EXTRACTION  2015   COLONOSCOPY     COLONOSCOPY WITH PROPOFOL N/A 06/14/2019   Procedure: COLONOSCOPY WITH PROPOFOL;  Surgeon: Lin Landsman, MD;  Location: [facility] ENDOSCOPY;  Service: Gastroenterology;  Laterality: N/A;   ESOPHAGOGASTRODUODENOSCOPY (EGD) WITH PROPOFOL N/A 06/14/2019   Procedure: ESOPHAGOGASTRODUODENOSCOPY (EGD) WITH PROPOFOL;  Surgeon: Lin Landsman, MD;  Location: [facility] ENDOSCOPY;  Service: Gastroenterology;  Laterality: N/A;   MASTECTOMY     SIMPLE MASTECTOMY WITH AXILLARY SENTINEL NODE BIOPSY Right 01/06/2020   Procedure: SIMPLE  MASTECTOMY WITH AXILLARY SENTINEL NODE BIOPSY;  Surgeon: Robert Bellow, MD;  Location: [facility] ORS;  Service: General;  Laterality: Right;   Patient Active Problem List   Diagnosis Date Noted   Lower extremity weakness 02/21/2022   Cutaneous abscess of trunk 10/16/2021   Diabetes mellitus without complication ([place]) XX123456   Radiation effect, initial encounter 12/10/2019   Long term (current) use of aspirin 12/09/2019   Genetic testing 11/20/2019   Family history of breast cancer    Family history of prostate cancer    Family history of lung cancer    Family history of non-Hodgkin's lymphoma    Malignant neoplasm of right breast ([place])    Localized swelling of left foot 09/27/2019   Breast mass, right 09/27/2019   Positive FIT (fecal immunochemical test)    Anemia in chronic kidney disease (CKD) 03/13/2019   CKD stage 3 due to type 2 diabetes mellitus ([place]) 10/22/2018   Depressive disorder 07/02/2018   HLD (hyperlipidemia) 06/29/2018   Essential hypertension 06/29/2018    REFERRING DIAG: R29.898 (ICD-10-CM) - Weakness of both lower extremities   THERAPY DIAG:  Dizziness and giddiness  Unsteadiness on feet  Muscle weakness (generalized)  Rationale for Evaluation and Treatment Rehabilitation  PERTINENT HISTORY: LE weakness, balance. LOB x 1 in the past  6 months. Did not fall down, fell back while walking down bleachers. Feels like her balance is off at times. Occurred around the end of December 2023 while at a basketball game. It was instantaneous, feels like she was just thrown off. Pt was in a hurry going out of [employer] and tripped on one of the speed bumps in the parking lot. States L LE feels more weak than the R side. Has had difficulty with balance for about a year. Room feels like it is spinning around when she first gets up out of bed in the morning, has to sit for about 2 minutes before getting up. Turning real fast makes the room feel like it is spinning. Going up  stairs is fine but feels insecure going down steps. Pt also states having neck pain. Going down steps and balance are her PT problems.   PRECAUTIONS: fall risk  SUBJECTIVE:   SUBJECTIVE STATEMENT: Reports good compliance with Epley at home. Still having dizziness but not as severe as in it is not lasting as long.   PAIN:  Are you having pain? Neck hurts, gets used to it. Other than that, not too bad.    TODAY'S TREATMENT:                                                                                                                                         DATE: 04/13/2022  Marye Round             (+) L ear indicative of posterior cupulolithiasis. No notable posterior up beating nystagmus. Symptoms last 1 minute and 10 seconds.    Canalith Repositioning    Epley for L ear x2 trials performed.    Marye Round for L ear reassessed post x2 treatment via [employer]. Lasting ~56 seconds. Reports reduction in severity of dizziness.    Neuro Re-ed:   Airex pad, eyes closed: 3x30 sec, SBA. Intermittent ankle righting strategy AP to correct.   Airex pad, eyes opened:    Vertical head turns: 3x30 sec. CGA.    Horizontal head turns: 3x30 sec. CGA.   Reviewed balance and cervical mobility HEP. Pt with limited cervical extension. Educated to trial two pillows at shoulders to provided greater cervical extension for Epley maneuver. Provided cervical rotation exercise to HEP with review on reps/sets/frequency.        Improved exercise technique, movement at target joints, use of target muscles after mod verbal, visual, tactile cues.     Response to treatment Pt tolerated session well without aggravation of symptoms.      Clinical impression Continuing PT POC with continued BPPV treatment. 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 Pt without posterior upbeating nystagmus but does report appropriate symptoms associated with BPPV. Marye Round re-assessment after x2  treatment performed pt reporting reduced severity of symptoms and also symptoms lasting from 1 minute 10 seconds to 56  seconds. Initiating vestibular biased balance training with unstable surface with head movements and eyes closed with fair to moderate ankle righting reaction to correct balance. Pt encouraged to trial home Epley maneuver with 2 pillows at thoracic region ~T7 to bias greater cervicothoracic extension in presence of limited cervical mobility. Pt understanding.  Pt will benefit from continued skilled physical therapy services to improve strength, balance, and function.      PATIENT EDUCATION: Education details: there-ex, HEP Person educated: Patient Education method: Explanation, Demonstration, Tactile cues, Verbal cues, and Handouts Education comprehension: verbalized understanding and returned demonstration  HOME EXERCISE PROGRAM: Access Code: [email or web address] URL: [email or web address] Date: 04/07/2022 Prepared by: Joneen Boers  Exercises - Self-Epley Maneuver Left Ear  - 1 x daily - 7 x weekly - 3 sets - 10 reps - Seated Scapular Retraction  - 3 x daily - 7 x weekly - 3 sets - 10 reps - 5 seconds hold    Access Code: [email or web address] URL: [email or web address] Date: 04/13/2022 Prepared by: Larna Daughters  Exercises - Self-Epley Maneuver Left Ear  - 1 x daily - 7 x weekly - 3 sets - 10 reps - Seated Scapular Retraction  - 3 x daily - 7 x weekly - 3 sets - 10 reps - 5 seconds hold - Seated Cervical Retraction  - 1 x daily - 7 x weekly - 2-3 sets - 12 reps - Seated Cervical Rotation AROM  - 1 x daily - 7 x weekly - 2-3 sets - 12 reps   PT Short Term Goals - 03/28/22 1310       PT SHORT TERM GOAL #1   Title Pt will be independent with her initial HEP to improve strength, balance, ability to perform supine <> sit transfers, as well as ambulate while turning her head with less dizziness.    Time 3    Period Weeks    Status New    Target Date 04/21/22               PT Long Term Goals - 03/28/22 1316       PT LONG TERM GOAL #1   Title Pt will demonstrate negative Marye Round for L ear to promote ability to perform supine <> sit transfers as well as ambulate while looking around with decreased dizziness and improved balance.    Baseline (+) [employer] pike on L ear with reproduction of symptoms (03/28/2022)    Time 8    Period Weeks    Status New    Target Date 05/26/22      PT LONG TERM GOAL #2   Title Pt will improve bilateral hip extension and abduction strength by at least 1/2 MMT to promote ability to negotiate stairs with less difficulty.    Baseline hip extension 4-/5 R, 3+/5 L, hip abduction 4/5 R, 4-/5 L (03/28/2022)    Time 8    Period Weeks    Status New    Target Date 05/26/22      PT LONG TERM GOAL #3   Title Pt will improve her DGI score by at least 3 points as a demonstration of improved balance.    Baseline DGI: 19 (03/28/2022)    Time 8    Period Weeks    Status New    Target Date 05/26/22                 Salem Caster. Fairly IV, PT, DPT Physical Therapist- [facility]  04/13/2022, 1:51  PM

## 2022-04-14 ENCOUNTER — Other Ambulatory Visit (HOSPITAL_COMMUNITY): Payer: Self-pay

## 2022-04-18 ENCOUNTER — Ambulatory Visit: Payer: Medicare Other

## 2022-04-18 NOTE — Therapy (Signed)
Opened in error

## 2022-04-21 ENCOUNTER — Ambulatory Visit: Payer: Medicare Other

## 2022-04-21 DIAGNOSIS — R42 Dizziness and giddiness: Secondary | ICD-10-CM

## 2022-04-21 DIAGNOSIS — M6281 Muscle weakness (generalized): Secondary | ICD-10-CM

## 2022-04-21 DIAGNOSIS — R2681 Unsteadiness on feet: Secondary | ICD-10-CM

## 2022-04-21 NOTE — Telephone Encounter (Signed)
Received patient portion of application. Sent MD portion to fax number [phone]

## 2022-04-21 NOTE — Therapy (Signed)
OUTPATIENT PHYSICAL THERAPY TREATMENT NOTE   Patient Name: Tracy Lawrence MRN: [id number] DOB:March 07, 1940, 82 y.o., female Today's Date: 04/21/2022  PCP: Burnard Hawthorne, FNP  REFERRING PROVIDER: Burnard Hawthorne, FNP   END OF SESSION:  PT End of Session - 04/21/22 1548     Visit Number 4    Number of Visits 17    Date for PT Re-Evaluation 05/26/22    PT Start Time 1548    PT Stop Time 1628    PT Time Calculation (min) 40 min    Activity Tolerance Patient tolerated treatment well    Behavior During Therapy [facility] for tasks assessed/performed              Past Medical History:  Diagnosis Date   Anemia    Arthritis    Breast cancer ([place]) 2002   right breast   Breast cancer ([place]) 2011   left breast   Cancer ([place])    Family history of breast cancer    Family history of lung cancer    Family history of non-Hodgkin's lymphoma    Family history of prostate cancer    Hypertension    Malignant neoplasm of right breast [facility])    Personal history of radiation therapy 2002, 2011   right and left breast   Pre-diabetes    Past Surgical History:  Procedure Laterality Date   ABDOMINAL HYSTERECTOMY  2018   BREAST BIOPSY Right 10/04/2018   u/s bx, vision marker, path pending   BREAST LUMPECTOMY Right 2002   breast ca with rad   BREAST LUMPECTOMY Left 2011   breast ca with rad   BREAST SURGERY  2011,2002   CATARACT EXTRACTION  2015   COLONOSCOPY     COLONOSCOPY WITH PROPOFOL N/A 06/14/2019   Procedure: COLONOSCOPY WITH PROPOFOL;  Surgeon: Lin Landsman, MD;  Location: [facility] ENDOSCOPY;  Service: Gastroenterology;  Laterality: N/A;   ESOPHAGOGASTRODUODENOSCOPY (EGD) WITH PROPOFOL N/A 06/14/2019   Procedure: ESOPHAGOGASTRODUODENOSCOPY (EGD) WITH PROPOFOL;  Surgeon: Lin Landsman, MD;  Location: [facility] ENDOSCOPY;  Service: Gastroenterology;  Laterality: N/A;   MASTECTOMY     SIMPLE MASTECTOMY WITH AXILLARY SENTINEL NODE BIOPSY Right 01/06/2020   Procedure:  SIMPLE MASTECTOMY WITH AXILLARY SENTINEL NODE BIOPSY;  Surgeon: Robert Bellow, MD;  Location: [facility] ORS;  Service: General;  Laterality: Right;   Patient Active Problem List   Diagnosis Date Noted   Lower extremity weakness 02/21/2022   Cutaneous abscess of trunk 10/16/2021   Diabetes mellitus without complication ([place]) [id number]   Radiation effect, initial encounter 12/10/2019   Long term (current) use of aspirin 12/09/2019   Genetic testing 11/20/2019   Family history of breast cancer    Family history of prostate cancer    Family history of lung cancer    Family history of non-Hodgkin's lymphoma    Malignant neoplasm of right breast ([place])    Localized swelling of left foot 09/27/2019   Breast mass, right 09/27/2019   Positive FIT (fecal immunochemical test)    Anemia in chronic kidney disease (CKD) 03/13/2019   CKD stage 3 due to type 2 diabetes mellitus ([place]) 10/22/2018   Depressive disorder 07/02/2018   HLD (hyperlipidemia) 06/29/2018   Essential hypertension 06/29/2018    REFERRING DIAG: R29.898 (ICD-10-CM) - Weakness of both lower extremities   THERAPY DIAG:  Dizziness and giddiness  Unsteadiness on feet  Muscle weakness (generalized)  Rationale for Evaluation and Treatment Rehabilitation  PERTINENT HISTORY: LE weakness, balance. LOB x 1 in the  past 6 months. Did not fall down, fell back while walking down bleachers. Feels like her balance is off at times. Occurred around the end of December 2023 while at a basketball game. It was instantaneous, feels like she was just thrown off. Pt was in a hurry going out of [employer] and tripped on one of the speed bumps in the parking lot. States L LE feels more weak than the R side. Has had difficulty with balance for about a year. Room feels like it is spinning around when she first gets up out of bed in the morning, has to sit for about 2 minutes before getting up. Turning real fast makes the room feel like it is spinning. Going  up stairs is fine but feels insecure going down steps. Pt also states having neck pain. Going down steps and balance are her PT problems.   PRECAUTIONS: fall risk  SUBJECTIVE:   SUBJECTIVE STATEMENT: Dizziness is pretty good, at least today it is.    PAIN:  Are you having pain? Neck hurts, gets used to it. Other than that, not too bad.    TODAY'S TREATMENT:                                                                                                                                         DATE: 04/21/2022   Therapeutic exercise   Seated thoracic extension at chair 10x3 with 5 second holds   Seated chin tucks 10x3 with 5 second holds   Standing forward weight shift for ankle strategy 10x5 seconds for 3 sets  Standing B shoulder extension with scapular retraction red band 10x2  Single leg dead lift with contralateral UE assist  R 10x3  L 10x3   Side stepping with red band around knees 30 ft to the L and 30 ft to the R for 2 sets  Good glute med muscle use felt.      Improved exercise technique, movement at target joints, use of target muscles after mod verbal, visual, tactile cues.       Response to treatment Pt tolerated session well without aggravation of symptoms.      Clinical impression Improved dizziness based on subjective reports. Worked on thoracic extension to help improve steadiness with gait when looking up. Worked on forward weight shifting with emphasis on ankle movement to promote ankle strategy as well as to decrease tendency for backward lean. Worked on glute med and max strength to improve single leg stance strength for obstacle negotiation. Pt tolerated session well without aggravation of symptoms. Pt will benefit from continued skilled physical therapy services to improve strength, balance, and function.       PATIENT EDUCATION: Education details: there-ex, HEP Person educated: Patient Education method: Explanation, Demonstration, Tactile  cues, Verbal cues, and Handouts Education comprehension: verbalized understanding and returned demonstration  HOME EXERCISE PROGRAM: Access Code: [email or web address] URL: [email or web address] Date: 04/07/2022  Prepared by: Joneen Boers  Exercises - Self-Epley Maneuver Left Ear  - 1 x daily - 7 x weekly - 3 sets - 10 reps - Seated Scapular Retraction  - 3 x daily - 7 x weekly - 3 sets - 10 reps - 5 seconds hold   PT Short Term Goals - 03/28/22 1310       PT SHORT TERM GOAL #1   Title Pt will be independent with her initial HEP to improve strength, balance, ability to perform supine <> sit transfers, as well as ambulate while turning her head with less dizziness.    Time 3    Period Weeks    Status New    Target Date 04/21/22              PT Long Term Goals - 03/28/22 1316       PT LONG TERM GOAL #1   Title Pt will demonstrate negative Marye Round for L ear to promote ability to perform supine <> sit transfers as well as ambulate while looking around with decreased dizziness and improved balance.    Baseline (+) [employer] pike on L ear with reproduction of symptoms (03/28/2022)    Time 8    Period Weeks    Status New    Target Date 05/26/22      PT LONG TERM GOAL #2   Title Pt will improve bilateral hip extension and abduction strength by at least 1/2 MMT to promote ability to negotiate stairs with less difficulty.    Baseline hip extension 4-/5 R, 3+/5 L, hip abduction 4/5 R, 4-/5 L (03/28/2022)    Time 8    Period Weeks    Status New    Target Date 05/26/22      PT LONG TERM GOAL #3   Title Pt will improve her DGI score by at least 3 points as a demonstration of improved balance.    Baseline DGI: 19 (03/28/2022)    Time 8    Period Weeks    Status New    Target Date 05/26/22              Plan - 04/21/22 1547     Clinical Impression Statement Improved dizziness based on subjective reports. Worked on thoracic extension to help improve steadiness with gait  when looking up. Worked on forward weight shifting with emphasis on ankle movement to promote ankle strategy as well as to decrease tendency for backward lean. Worked on glute med and max strength to improve single leg stance strength for obstacle negotiation. Pt tolerated session well without aggravation of symptoms. Pt will benefit from continued skilled physical therapy services to improve strength, balance, and function.    Personal Factors and Comorbidities Comorbidity 2;Age;Past/Current Experience;Fitness;Time since onset of injury/illness/exacerbation    Comorbidities Arthritis, CA    Examination-Activity Limitations Stairs;Locomotion Level    Stability/Clinical Decision Making Stable/Uncomplicated    Rehab Potential Fair    PT Frequency 2x / week    PT Duration 8 weeks    PT Treatment/Interventions Therapeutic activities;Therapeutic exercise;Neuromuscular re-education;Balance training;Manual techniques;Canalith Repostioning;Electrical Stimulation;Stair training;Patient/family education;Dry needling    PT Next Visit Plan Epley Maneuver, hip and knee strengthening, stair negotiation, manual technques, modalities PRN    Consulted and Agree with Plan of Care Patient               Joneen Boers PT, DPT  04/21/2022, 4:39 PM

## 2022-04-25 ENCOUNTER — Ambulatory Visit: Payer: Medicare Other

## 2022-04-25 DIAGNOSIS — R42 Dizziness and giddiness: Secondary | ICD-10-CM | POA: Diagnosis not present

## 2022-04-25 DIAGNOSIS — M6281 Muscle weakness (generalized): Secondary | ICD-10-CM

## 2022-04-25 DIAGNOSIS — R2681 Unsteadiness on feet: Secondary | ICD-10-CM

## 2022-04-25 NOTE — Therapy (Signed)
OUTPATIENT PHYSICAL THERAPY TREATMENT NOTE   Patient Name: Tracy Lawrence MRN: [id number] DOB:[birth year], 82 y.o., female Today's Date: 04/25/2022  PCP: Burnard Hawthorne, FNP  REFERRING PROVIDER: Burnard Hawthorne, FNP   END OF SESSION:  PT End of Session - 04/25/22 1329     Visit Number 5    Number of Visits 17    Date for PT Re-Evaluation 05/26/22    PT Start Time 1329    PT Stop Time 1408    PT Time Calculation (min) 39 min    Activity Tolerance Patient tolerated treatment well    Behavior During Therapy [facility] for tasks assessed/performed               Past Medical History:  Diagnosis Date   Anemia    Arthritis    Breast cancer ([place]) 2002   right breast   Breast cancer ([place]) 2011   left breast   Cancer ([place])    Family history of breast cancer    Family history of lung cancer    Family history of non-Hodgkin's lymphoma    Family history of prostate cancer    Hypertension    Malignant neoplasm of right breast [facility])    Personal history of radiation therapy 2002, 2011   right and left breast   Pre-diabetes    Past Surgical History:  Procedure Laterality Date   ABDOMINAL HYSTERECTOMY  2018   BREAST BIOPSY Right 10/04/2018   u/s bx, vision marker, path pending   BREAST LUMPECTOMY Right 2002   breast ca with rad   BREAST LUMPECTOMY Left 2011   breast ca with rad   BREAST SURGERY  2011,2002   CATARACT EXTRACTION  2015   COLONOSCOPY     COLONOSCOPY WITH PROPOFOL N/A 06/14/2019   Procedure: COLONOSCOPY WITH PROPOFOL;  Surgeon: Lin Landsman, MD;  Location: [facility] ENDOSCOPY;  Service: Gastroenterology;  Laterality: N/A;   ESOPHAGOGASTRODUODENOSCOPY (EGD) WITH PROPOFOL N/A 06/14/2019   Procedure: ESOPHAGOGASTRODUODENOSCOPY (EGD) WITH PROPOFOL;  Surgeon: Lin Landsman, MD;  Location: [facility] ENDOSCOPY;  Service: Gastroenterology;  Laterality: N/A;   MASTECTOMY     SIMPLE MASTECTOMY WITH AXILLARY SENTINEL NODE BIOPSY Right 01/06/2020   Procedure:  SIMPLE MASTECTOMY WITH AXILLARY SENTINEL NODE BIOPSY;  Surgeon: Robert Bellow, MD;  Location: [facility] ORS;  Service: General;  Laterality: Right;   Patient Active Problem List   Diagnosis Date Noted   Lower extremity weakness 02/21/2022   Cutaneous abscess of trunk 10/16/2021   Diabetes mellitus without complication ([place]) [id number]   Radiation effect, initial encounter 12/10/2019   Long term (current) use of aspirin 12/09/2019   Genetic testing 11/20/2019   Family history of breast cancer    Family history of prostate cancer    Family history of lung cancer    Family history of non-Hodgkin's lymphoma    Malignant neoplasm of right breast ([place])    Localized swelling of left foot 09/27/2019   Breast mass, right 09/27/2019   Positive FIT (fecal immunochemical test)    Anemia in chronic kidney disease (CKD) 03/13/2019   CKD stage 3 due to type 2 diabetes mellitus ([place]) 10/22/2018   Depressive disorder 07/02/2018   HLD (hyperlipidemia) 06/29/2018   Essential hypertension 06/29/2018    REFERRING DIAG: R29.898 (ICD-10-CM) - Weakness of both lower extremities   THERAPY DIAG:  Dizziness and giddiness  Unsteadiness on feet  Muscle weakness (generalized)  Rationale for Evaluation and Treatment Rehabilitation  PERTINENT HISTORY: LE weakness, balance. LOB x 1 in  the past 6 months. Did not fall down, fell back while walking down bleachers. Feels like her balance is off at times. Occurred around the end of December 2023 while at a basketball game. It was instantaneous, feels like she was just thrown off. Pt was in a hurry going out of [employer] and tripped on one of the speed bumps in the parking lot. States L LE feels more weak than the R side. Has had difficulty with balance for about a year. Room feels like it is spinning around when she first gets up out of bed in the morning, has to sit for about 2 minutes before getting up. Turning real fast makes the room feel like it is spinning. Going  up stairs is fine but feels insecure going down steps. Pt also states having neck pain. Going down steps and balance are her PT problems.   PRECAUTIONS: fall risk  SUBJECTIVE:   SUBJECTIVE STATEMENT: Dizziness and balance are better.    PAIN:  Are you having pain? Just R foot pain. Everything else is ok.     TODAY'S TREATMENT:                                                                                                                                         DATE: 04/25/2022   Therapeutic exercise   Seated thoracic extension at chair 10x3 with 5 second holds   Seated chin tucks 10x3 with 5 second holds   Single leg dead lift with contralateral UE assist  R 10x3  L 10x3  Standing B shoulder extension with scapular retraction red band 10x3  Standing forward weight shift for ankle strategy 10x5 seconds for 3 sets  Side stepping with red band around knees 32 ft to the L and 32 ft to the R for 2 sets  Good glute med muscle use felt.    Gait with R and L head turns 32 ft x 2  Ascending and descending 4 regular steps without UE assist 2x SBA  Able to perform  Gait with vertical head movement (looking up) 32 ft   steady     Improved exercise technique, movement at target joints, use of target muscles after mod verbal, visual, tactile cues.       Response to treatment Pt tolerated session well without aggravation of symptoms.      Clinical impression Good progress with balance and dizziness based on subjective reports. Improved steadiness with stair negotiation and gait while looking up. Continued working on thoracic extension to help improve steadiness with gait when looking up. Worked on forward weight shifting with emphasis on ankle movement to promote ankle strategy as well as to decrease tendency for backward lean. Worked on glute med and max strength to improve single leg stance strength for obstacle negotiation. Pt tolerated session well without aggravation of  symptoms. Pt will benefit from continued skilled physical therapy  services to improve strength, balance, and function.       PATIENT EDUCATION: Education details: there-ex, HEP Person educated: Patient Education method: Explanation, Demonstration, Tactile cues, Verbal cues, and Handouts Education comprehension: verbalized understanding and returned demonstration  HOME EXERCISE PROGRAM: Access Code: [email or web address] URL: [email or web address] Date: 04/25/2022 Prepared by: Joneen Boers  Exercises - Self-Epley Maneuver Left Ear  - 1 x daily - 7 x weekly - 3 sets - 10 reps - Seated Scapular Retraction  - 3 x daily - 7 x weekly - 3 sets - 10 reps - 5 seconds hold - Seated Cervical Retraction  - 1 x daily - 7 x weekly - 2-3 sets - 12 reps - Seated Cervical Rotation AROM  - 1 x daily - 7 x weekly - 2-3 sets - 12 reps     PT Short Term Goals - 03/28/22 1310       PT SHORT TERM GOAL #1   Title Pt will be independent with her initial HEP to improve strength, balance, ability to perform supine <> sit transfers, as well as ambulate while turning her head with less dizziness.    Time 3    Period Weeks    Status New    Target Date 04/21/22              PT Long Term Goals - 03/28/22 1316       PT LONG TERM GOAL #1   Title Pt will demonstrate negative Marye Round for L ear to promote ability to perform supine <> sit transfers as well as ambulate while looking around with decreased dizziness and improved balance.    Baseline (+) [employer] pike on L ear with reproduction of symptoms (03/28/2022)    Time 8    Period Weeks    Status New    Target Date 05/26/22      PT LONG TERM GOAL #2   Title Pt will improve bilateral hip extension and abduction strength by at least 1/2 MMT to promote ability to negotiate stairs with less difficulty.    Baseline hip extension 4-/5 R, 3+/5 L, hip abduction 4/5 R, 4-/5 L (03/28/2022)    Time 8    Period Weeks    Status New    Target Date  05/26/22      PT LONG TERM GOAL #3   Title Pt will improve her DGI score by at least 3 points as a demonstration of improved balance.    Baseline DGI: 19 (03/28/2022)    Time 8    Period Weeks    Status New    Target Date 05/26/22              Plan - 04/25/22 1323     Clinical Impression Statement Good progress with balance and dizziness based on subjective reports. Improved steadiness with stair negotiation and gait while looking up. Continued working on thoracic extension to help improve steadiness with gait when looking up. Worked on forward weight shifting with emphasis on ankle movement to promote ankle strategy as well as to decrease tendency for backward lean. Worked on glute med and max strength to improve single leg stance strength for obstacle negotiation. Pt tolerated session well without aggravation of symptoms. Pt will benefit from continued skilled physical therapy services to improve strength, balance, and function.    Personal Factors and Comorbidities Comorbidity 2;Age;Past/Current Experience;Fitness;Time since onset of injury/illness/exacerbation    Comorbidities Arthritis, CA    Examination-Activity Limitations Stairs;Locomotion Level    Stability/Clinical  Decision Making Stable/Uncomplicated    Rehab Potential Fair    PT Frequency 2x / week    PT Duration 8 weeks    PT Treatment/Interventions Therapeutic activities;Therapeutic exercise;Neuromuscular re-education;Balance training;Manual techniques;Canalith Repostioning;Electrical Stimulation;Stair training;Patient/family education;Dry needling    PT Next Visit Plan Epley Maneuver, hip and knee strengthening, stair negotiation, manual technques, modalities PRN    Consulted and Agree with Plan of Care Patient                Joneen Boers PT, DPT  04/25/2022, 2:12 PM

## 2022-04-26 ENCOUNTER — Telehealth: Payer: Self-pay

## 2022-04-26 ENCOUNTER — Telehealth: Payer: Self-pay | Admitting: Family

## 2022-04-26 NOTE — Progress Notes (Signed)
   04/26/2022  Patient ID: Tracy Lawrence, female   DOB: September 19, 1940, 82 y.o.   MRN: [id number]  [name] contacted me stating she had not yet received Farxiga from the patient assistance program.  It appears her portion of the application has been received, and the provider portion was faxed for completion 04/21/22.  Contacting PCP to verify this has been received and see if they have a sample the patient can get in the mean time.  Darlina Guys, PharmD, DPLA

## 2022-04-26 NOTE — Telephone Encounter (Signed)
Pt called stating she need samples of farxiga

## 2022-04-26 NOTE — Telephone Encounter (Signed)
Jenate- let pt know we are working on this  Malachy Mood We do not have farxiga in the office  We have jardiance 10mg  and 25mg   Would you agree with [place] providing jardiance 10mg  sample?

## 2022-04-26 NOTE — Telephone Encounter (Signed)
No problem anytime!!!

## 2022-04-26 NOTE — Telephone Encounter (Signed)
We do not have any samples of Farxiga in office is there anything else I can do

## 2022-04-26 NOTE — Telephone Encounter (Signed)
Spoke to pt and she is aware that we do not have any samples of Farxiga in office.

## 2022-04-26 NOTE — Telephone Encounter (Signed)
Spoke to pt and she is willing to try the Jardiance 10 mg in the place of Farxiga. I placed two sample  boxes upfront for pt to pick up to try out . Informed pt about the samples and told her she could pick them up today anytime before 5 pm.

## 2022-04-27 NOTE — Telephone Encounter (Signed)
Noted  Thank you jenate

## 2022-04-28 ENCOUNTER — Ambulatory Visit: Payer: Medicare Other

## 2022-04-28 ENCOUNTER — Other Ambulatory Visit (HOSPITAL_COMMUNITY): Payer: Self-pay

## 2022-04-28 NOTE — Telephone Encounter (Signed)
Patient has been enrolled in Patient Assistance through [place] until 01/31/2023 ID: [id number]

## 2022-04-28 NOTE — Telephone Encounter (Signed)
DONE

## 2022-05-03 ENCOUNTER — Other Ambulatory Visit: Payer: Self-pay | Admitting: Family

## 2022-05-03 MED ORDER — DAPAGLIFLOZIN PROPANEDIOL 10 MG PO TABS: 10.0000 mg | ORAL_TABLET | Freq: Every day | ORAL | 3 refills | Status: DC

## 2022-05-09 ENCOUNTER — Ambulatory Visit: Payer: Medicare Other | Attending: Family

## 2022-05-09 DIAGNOSIS — R42 Dizziness and giddiness: Secondary | ICD-10-CM | POA: Diagnosis present

## 2022-05-09 DIAGNOSIS — M6281 Muscle weakness (generalized): Secondary | ICD-10-CM | POA: Diagnosis present

## 2022-05-09 DIAGNOSIS — R2681 Unsteadiness on feet: Secondary | ICD-10-CM | POA: Insufficient documentation

## 2022-05-09 NOTE — Therapy (Signed)
OUTPATIENT PHYSICAL THERAPY TREATMENT NOTE And Discharge Summary   Patient Name: Tracy CampKathleen G Summons MRN: [id number] DOB:03/07/1940, 82 y.o., female Today's Date: 05/09/2022  PCP: Allegra GranaArnett, Margaret G, FNP  REFERRING PROVIDER: Allegra GranaArnett, Margaret G, FNP   END OF SESSION:  PT End of Session - 05/09/22 1143     Visit Number 6    Number of Visits 17    Date for PT Re-Evaluation 05/26/22    PT Start Time 1146    PT Stop Time 1211    PT Time Calculation (min) 25 min    Equipment Utilized During Treatment Gait belt    Activity Tolerance Patient tolerated treatment well    Behavior During Therapy [facility] for tasks assessed/performed                Past Medical History:  Diagnosis Date   Anemia    Arthritis    Breast cancer (HCC) 2002   right breast   Breast cancer (HCC) 2011   left breast   Cancer (HCC)    Family history of breast cancer    Family history of lung cancer    Family history of non-Hodgkin's lymphoma    Family history of prostate cancer    Hypertension    Malignant neoplasm of right breast [facility](HCC)    Personal history of radiation therapy 2002, 2011   right and left breast   Pre-diabetes    Past Surgical History:  Procedure Laterality Date   ABDOMINAL HYSTERECTOMY  2018   BREAST BIOPSY Right 10/04/2018   u/s bx, vision marker, path pending   BREAST LUMPECTOMY Right 2002   breast ca with rad   BREAST LUMPECTOMY Left 2011   breast ca with rad   BREAST SURGERY  2011,2002   CATARACT EXTRACTION  2015   COLONOSCOPY     COLONOSCOPY WITH PROPOFOL N/A 06/14/2019   Procedure: COLONOSCOPY WITH PROPOFOL;  Surgeon: Toney ReilVanga, Rohini Reddy, MD;  Location: [facility] ENDOSCOPY;  Service: Gastroenterology;  Laterality: N/A;   ESOPHAGOGASTRODUODENOSCOPY (EGD) WITH PROPOFOL N/A 06/14/2019   Procedure: ESOPHAGOGASTRODUODENOSCOPY (EGD) WITH PROPOFOL;  Surgeon: Toney ReilVanga, Rohini Reddy, MD;  [facility];  Service: Gastroenterology;  Laterality: N/A;   MASTECTOMY     SIMPLE  MASTECTOMY WITH AXILLARY SENTINEL NODE BIOPSY Right 01/06/2020   Procedure: SIMPLE MASTECTOMY WITH AXILLARY SENTINEL NODE BIOPSY;  Surgeon: Earline MayotteByrnett, Jeffrey W, MD;  Location: [facility] ORS;  Service: General;  Laterality: Right;   Patient Active Problem List   Diagnosis Date Noted   Lower extremity weakness 02/21/2022   Cutaneous abscess of trunk 10/16/2021   Diabetes mellitus without complication 12/10/2019   Radiation effect, initial encounter 12/10/2019   Long term (current) use of aspirin 12/09/2019   Genetic testing 11/20/2019   Family history of breast cancer    Family history of prostate cancer    Family history of lung cancer    Family history of non-Hodgkin's lymphoma    Malignant neoplasm of right breast    Localized swelling of left foot 09/27/2019   Breast mass, right 09/27/2019   Positive FIT (fecal immunochemical test)    Anemia in chronic kidney disease (CKD) 03/13/2019   CKD stage 3 due to type 2 diabetes mellitus 10/22/2018   Depressive disorder 07/02/2018   HLD (hyperlipidemia) 06/29/2018   Essential hypertension 06/29/2018    REFERRING DIAG: R29.898 (ICD-10-CM) - Weakness of both lower extremities   THERAPY DIAG:  Dizziness and giddiness  Unsteadiness on feet  Muscle weakness (generalized)  Rationale for Evaluation and Treatment Rehabilitation  PERTINENT HISTORY: LE weakness, balance. LOB x 1 in the past 6 months. Did not fall down, fell back while walking down bleachers. Feels like her balance is off at times. Occurred around the end of December 2023 while at a basketball game. It was instantaneous, feels like she was just thrown off. Pt was in a hurry going out of [employer] and tripped on one of the speed bumps in the parking lot. States L LE feels more weak than the R side. Has had difficulty with balance for about a year. Room feels like it is spinning around when she first gets up out of bed in the morning, has to sit for about 2 minutes before getting up. Turning  real fast makes the room feel like it is spinning. Going up stairs is fine but feels insecure going down steps. Pt also states having neck pain. Going down steps and balance are her PT problems.   PRECAUTIONS: fall risk  SUBJECTIVE:   SUBJECTIVE STATEMENT: Dizziness and balance are much better. Can turn around and be fine.  Neck feels better. Can walk now not fearing that she is going to fall.    PAIN:  Are you having pain? Just R foot pain. Everything else is ok.     TODAY'S TREATMENT:                                                                                                                                         DATE: 05/09/2022   Therapeutic exercise    Directed patient with gait with normal gait speed, with changes in speed, 180 degree pivot turn, with R and L cervical rotation position, with cervical flexion and extension position, stepping around obstacles, stepping over an obstacle, ascending and descending 4 regular steps with UE assist   Ascending and descending 4 regular steps with to without UE assist   4x  Able to perform last 2 repetitions without UE assist   Prone manually resisted hip extension, S/L hip abduction   Gilberto Better for L: Negative   Reviewed progress/current status with PT towards goals.   Reviewed POC: DC from PT secondary to good progress and pt achieving all goals and pt independent with her HEP.     Improved exercise technique, movement at target joints, use of target muscles after mod verbal, visual, tactile cues.       Response to treatment Pt tolerated session well without aggravation of symptoms.      Clinical impression Pt demonstrates improved B hip strength, balance, and negative Dix Hallpike test for vertigo since initial evaluation. Pt has achieved all goals and demonstrates independence with her HEP. Skilled physical therapy services discharged with pt continuing her progress with her exercises at home.          PATIENT EDUCATION: Education details: there-ex, HEP Person educated: Patient Education method: Explanation, Demonstration, Tactile cues, Verbal cues, and Handouts  Education comprehension: verbalized understanding and returned demonstration  HOME EXERCISE PROGRAM: Access Code: [email or web address] URL: [email or web address] Date: 04/25/2022 Prepared by: Loralyn Freshwater  Exercises - Self-Epley Maneuver Left Ear  - 1 x daily - 7 x weekly - 3 sets - 10 reps - Seated Scapular Retraction  - 3 x daily - 7 x weekly - 3 sets - 10 reps - 5 seconds hold - Seated Cervical Retraction  - 1 x daily - 7 x weekly - 2-3 sets - 12 reps - Seated Cervical Rotation AROM  - 1 x daily - 7 x weekly - 2-3 sets - 12 reps     PT Short Term Goals - 05/09/22 1158       PT SHORT TERM GOAL #1   Title Pt will be independent with her initial HEP to improve strength, balance, ability to perform supine <> sit transfers, as well as ambulate while turning her head with less dizziness.    Baseline Doing her HEP when she remembers them, no questions (05/09/2022)    Time 3    Period Weeks    Status Achieved    Target Date 04/21/22              PT Long Term Goals - 05/09/22 1158       PT LONG TERM GOAL #1   Title Pt will demonstrate negative Gilberto Better for L ear to promote ability to perform supine <> sit transfers as well as ambulate while looking around with decreased dizziness and improved balance.    Baseline (+) [employer] pike on L ear with reproduction of symptoms (03/28/2022); (-) Jeannene Patella pike (05/09/2022)    Time 8    Period Weeks    Status Achieved    Target Date 05/26/22      PT LONG TERM GOAL #2   Title Pt will improve bilateral hip extension and abduction strength by at least 1/2 MMT to promote ability to negotiate stairs with less difficulty.    Baseline hip extension 4-/5 R, 3+/5 L, hip abduction 4/5 R, 4-/5 L (03/28/2022); hip extension 4/5 R, 4/5 L, hip abduction 4+/5 R, 4+/5 L  (05/09/2022)    Time 8    Period Weeks    Status Achieved    Target Date 05/26/22      PT LONG TERM GOAL #3   Title Pt will improve her DGI score by at least 3 points as a demonstration of improved balance.    Baseline DGI: 19 (03/28/2022); 22 (05/09/2022)    Time 8    Period Weeks    Status Achieved    Target Date 05/26/22              Plan - 05/09/22 1141     Clinical Impression Statement Pt demonstrates improved B hip strength, balance, and negative Dix Hallpike test for vertigo since initial evaluation. Pt has achieved all goals and demonstrates independence with her HEP. Skilled physical therapy services discharged with pt continuing her progress with her exercises at home.    Personal Factors and Comorbidities Comorbidity 2;Age;Past/Current Experience;Fitness;Time since onset of injury/illness/exacerbation    Comorbidities Arthritis, CA    Examination-Activity Limitations Stairs;Locomotion Level    Stability/Clinical Decision Making Stable/Uncomplicated    Rehab Potential --    PT Frequency --    PT Duration --    PT Treatment/Interventions Therapeutic activities;Therapeutic exercise;Neuromuscular re-education;Balance training;Manual techniques;Canalith Repostioning;Stair training;Patient/family education    PT Next Visit Plan Continue progress with her exercises at home.  PT Home Exercise Plan Medbridge Access Code: [email or web address]    Consulted and Agree with Plan of Care Patient               Thank you for your referral.   Loralyn Freshwater PT, DPT  05/09/2022, 12:28 PM

## 2022-05-11 ENCOUNTER — Telehealth: Payer: Self-pay

## 2022-05-11 NOTE — Progress Notes (Signed)
   05/11/2022  Patient ID: Tracy Lawrence, female   DOB: 09-Jun-1940, 82 y.o.   MRN: [id number]  Returning patient call/voicemail regarding status of Farxiga PAP.  Patient still has not received Farxiga from the [employer] assistance program, but her application was approved 3/28.  Contacted [employer], and medication was mailed via UPS 05/10/22.  Shipping ID [phone] was provided, so I checked status on UPS site, but there are no updates as of now.  Patient has 5 more days worth of Jardiance sample provided by PCP office, and I expect shipment to arrive prior to running out of these. Contacted patient to make her aware of status and providing UPS tracking number via MyChart message.  She will contact me if there are any additional needs/concerns.  Lenna Gilford, PharmD, DPLA

## 2022-05-12 ENCOUNTER — Ambulatory Visit: Payer: Medicare Other

## 2022-05-31 ENCOUNTER — Telehealth: Payer: Self-pay

## 2022-05-31 NOTE — Telephone Encounter (Signed)
Patient is calling because she has a colonoscopy schedule for 06/17/2022. Patient would like to reschedule the appointment

## 2022-05-31 NOTE — Telephone Encounter (Signed)
Spoken to patient and we have reschedule to 07/15/2022.  Called endo unit to make the change.  New instructions will be sent.

## 2022-06-08 ENCOUNTER — Encounter: Payer: Self-pay | Admitting: Licensed Clinical Social Worker

## 2022-06-08 IMAGING — MG MM DIGITAL DIAGNOSTIC UNILAT*R* W/ TOMO W/ CAD
4 series · 4 of 12 positions shown · non-contrast
Comparison: Previous exam(s).

CLINICAL DATA: Patient returns after screening study for evaluation
of a possible RIGHT breast mass. History of RIGHT lumpectomy with
sentinel lymph node biopsy and radiation treatment 0880. Status post
LEFT lumpectomy with sentinel lymph node biopsy and MammoSite
radiation in 3700.

EXAM:
DIGITAL DIAGNOSTIC RIGHT MAMMOGRAM WITH CAD AND TOMO
ULTRASOUND RIGHT BREAST

[R CC synth-2D]
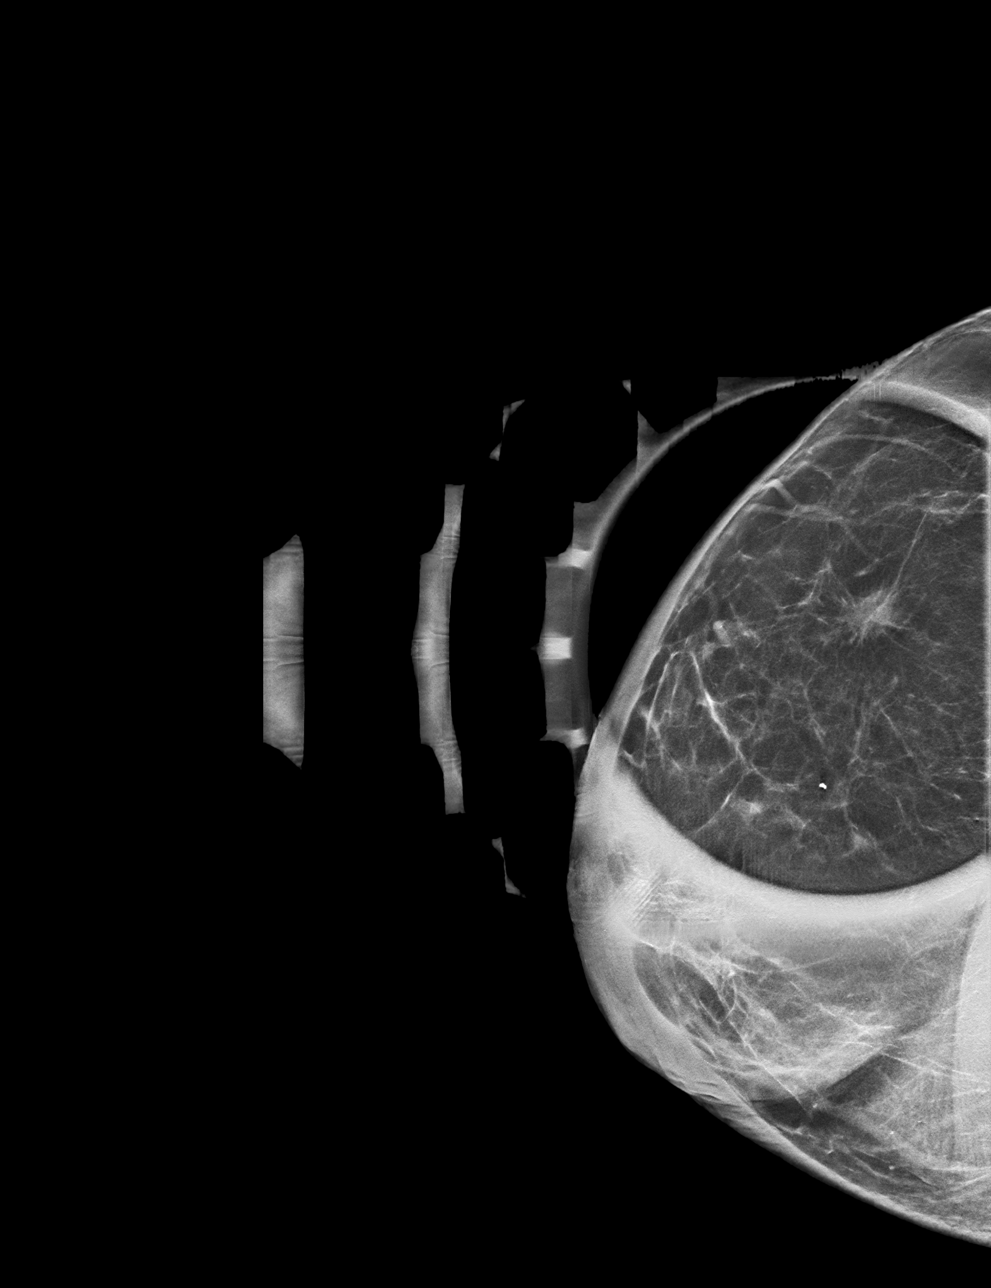

[R MLO synth-2D]
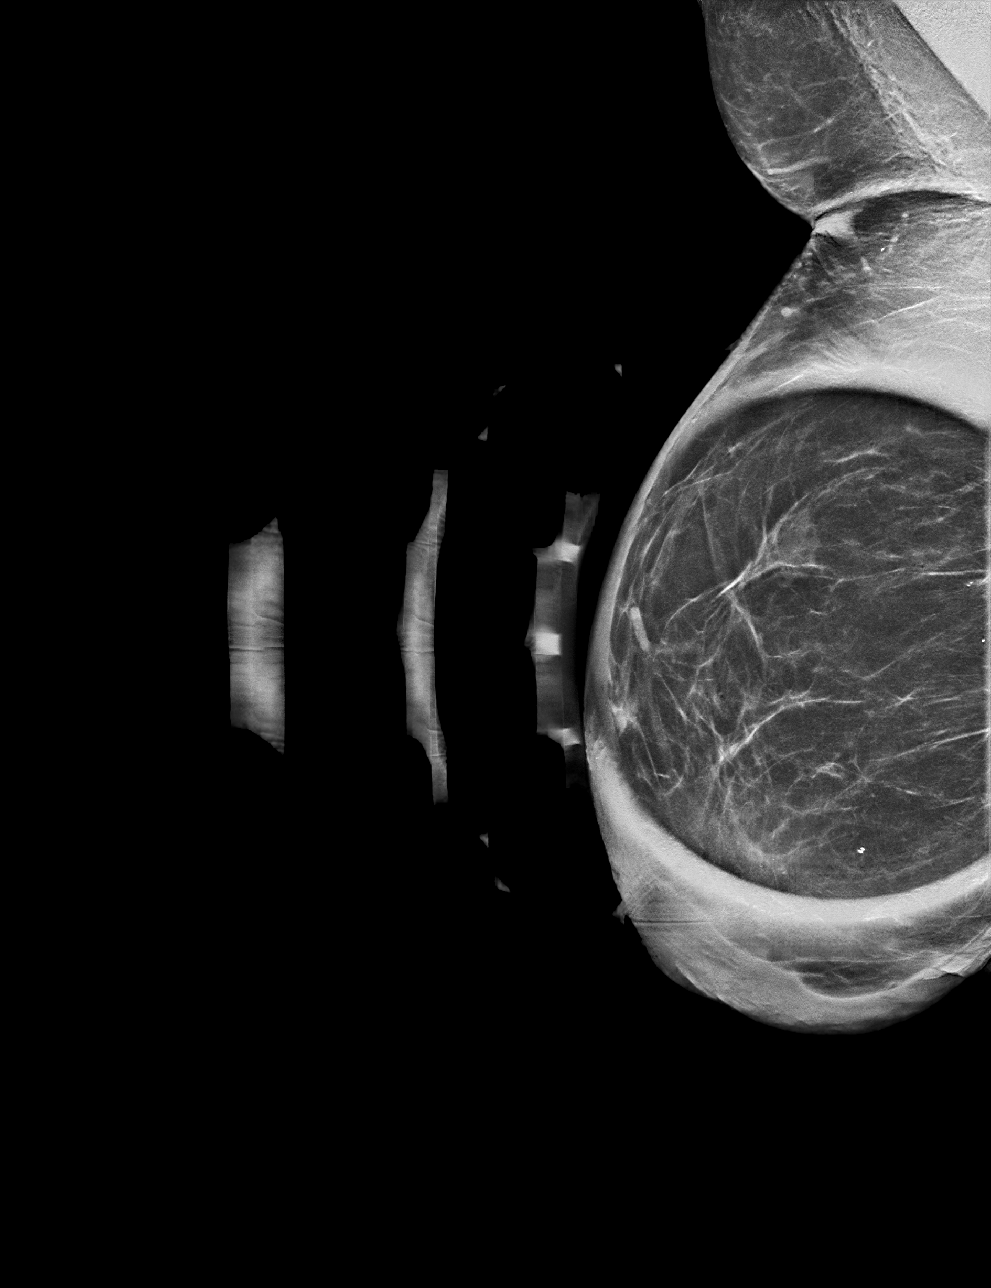

[R MLO tomo · tomo slice 33/65.0]
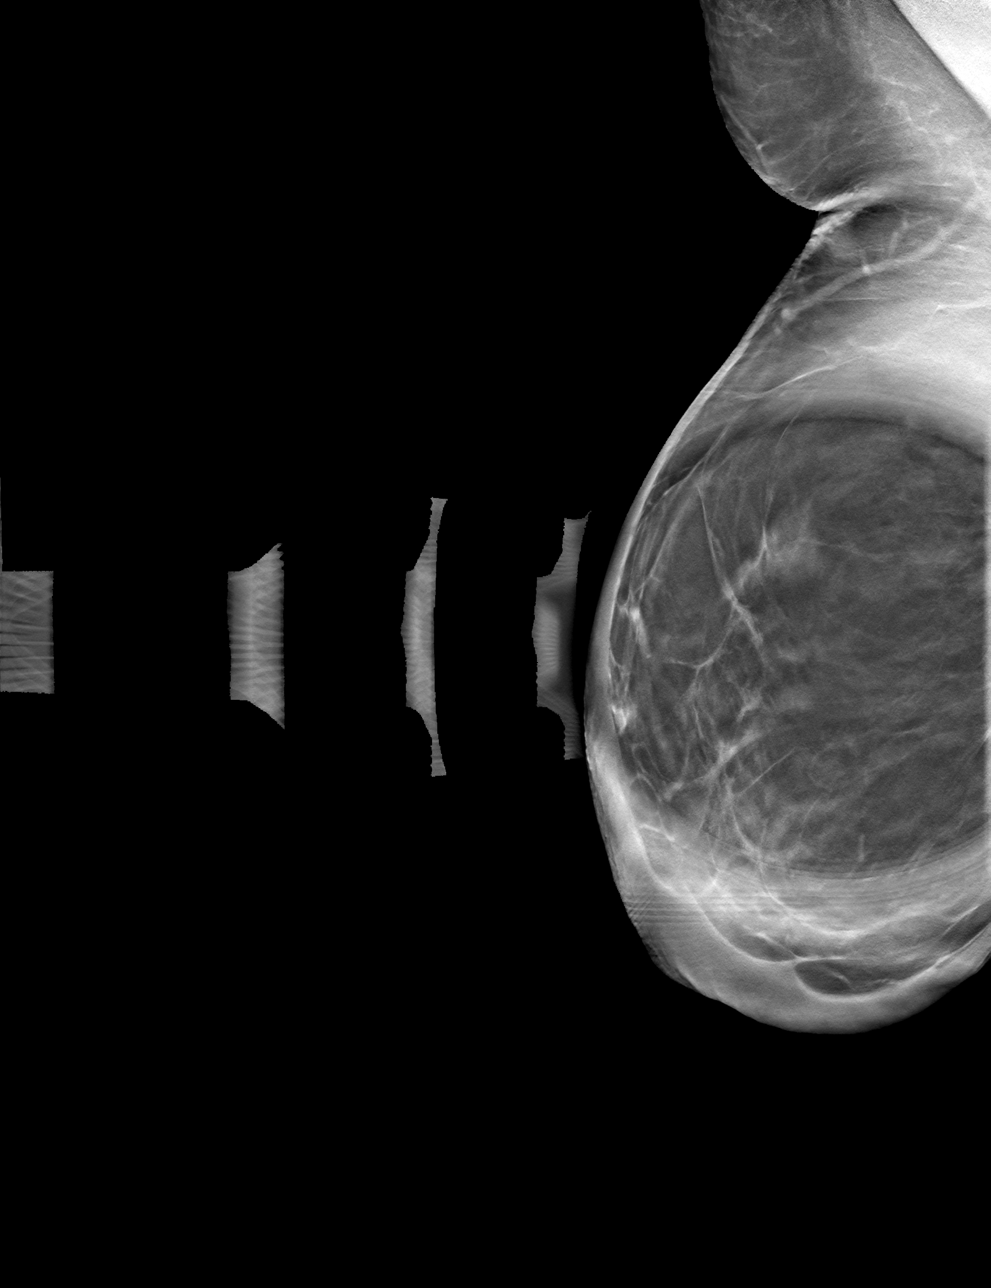

[R CC tomo · tomo slice 25/49.0]
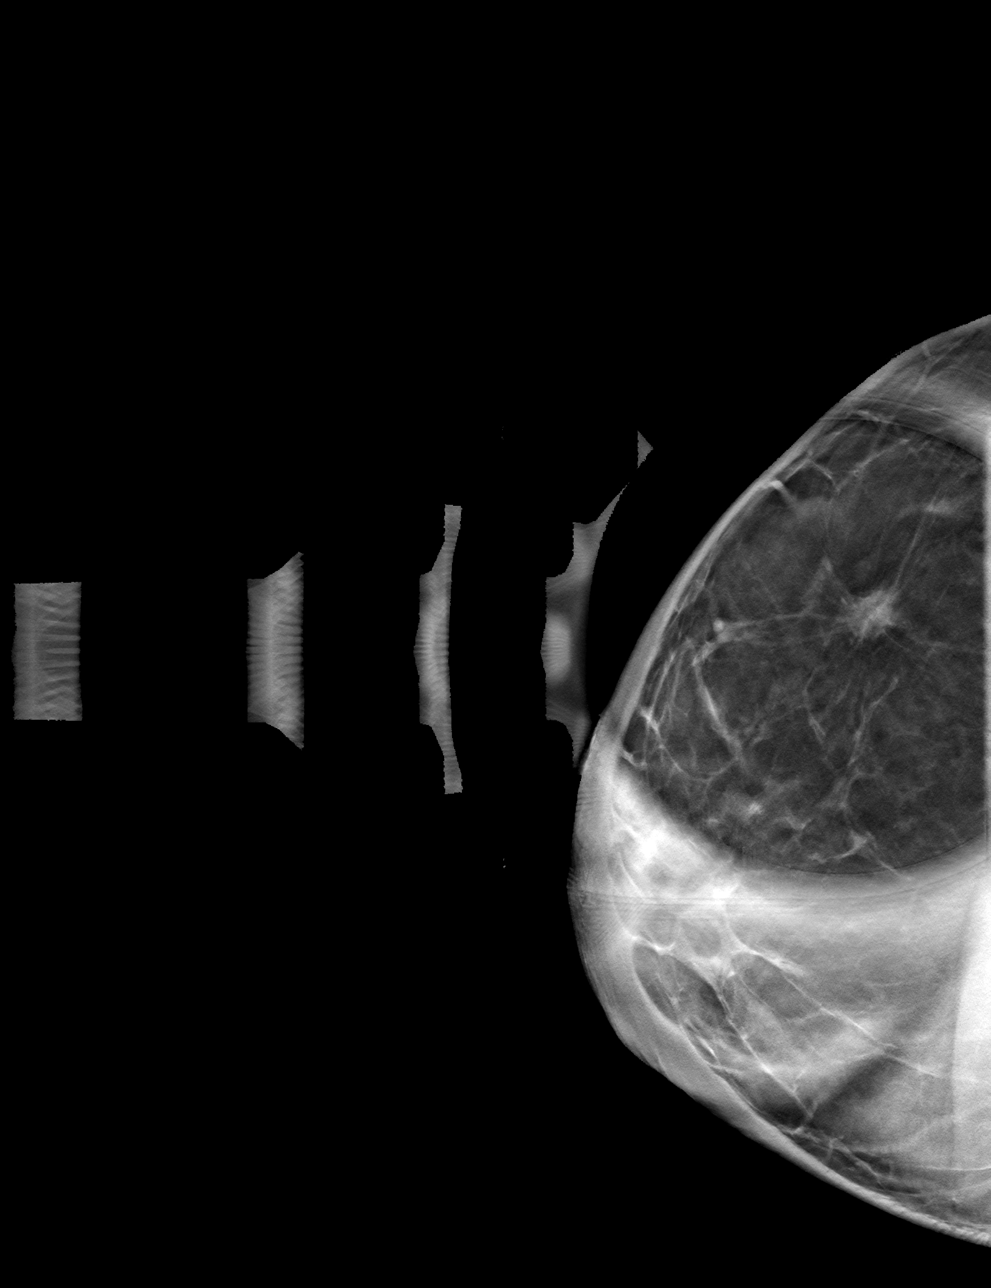

[4 of 12 positions shown; findings below may reference images not displayed]

ACR Breast Density Category b: There are scattered areas of
fibroglandular density.
FINDINGS: Additional 2-D and 3-D images are performed. These views from
presence of a spiculated mass in the LATERAL portion of the RIGHT
breast and further evaluated with ultrasound.

Mammographic images were processed with CAD.

On physical exam, I palpate no mass in the LATERAL aspect of the
RIGHT breast.

Targeted ultrasound is performed, showing an irregular hypoechoic
mass with posterior acoustic shadowing in the 10 o'clock location of
the RIGHT breast 5 centimeters from the nipple. Mass is 0.7 x 0.6 x
0.6 centimeters. Internal blood flow is confirmed on Doppler
evaluation.

Evaluation of the RIGHT axilla is negative for adenopathy.
IMPRESSION: Suspicious mass the 10 o'clock location of the RIGHT breast.

RECOMMENDATION:
Ultrasound-guided core biopsy of the RIGHT breast 10 o'clock
location.

I have discussed the findings and recommendations with the patient.
If applicable, a reminder letter will be sent to the patient
regarding the next appointment.

BI-RADS CATEGORY  4: Suspicious.

## 2022-06-08 NOTE — Progress Notes (Signed)
UPDATE: VUS in TSC2 called [id number] has been reclassified to "Likely Benign." The amended report date is 06/08/2022.

## 2022-06-10 IMAGING — DX DG FOOT COMPLETE 3+V*L*
3 series · 3 of 3 positions shown · non-contrast
Comparison: None.

CLINICAL DATA: Status post trauma.

EXAM:
LEFT FOOT - COMPLETE 3+ VIEW

[foot ap]
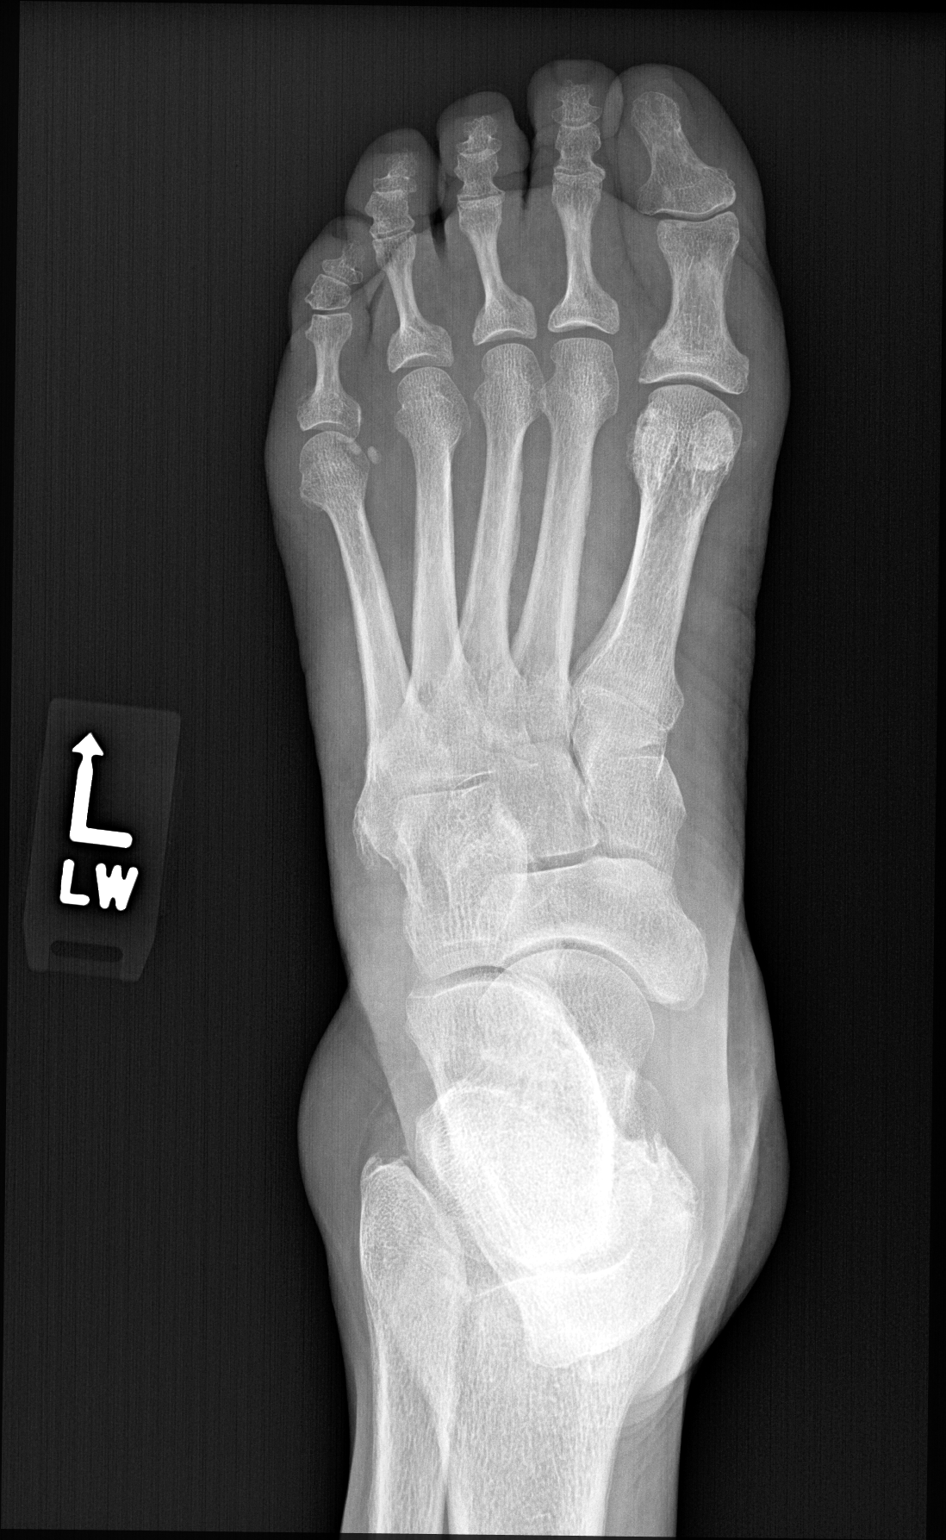

[foot obl (oblique)]
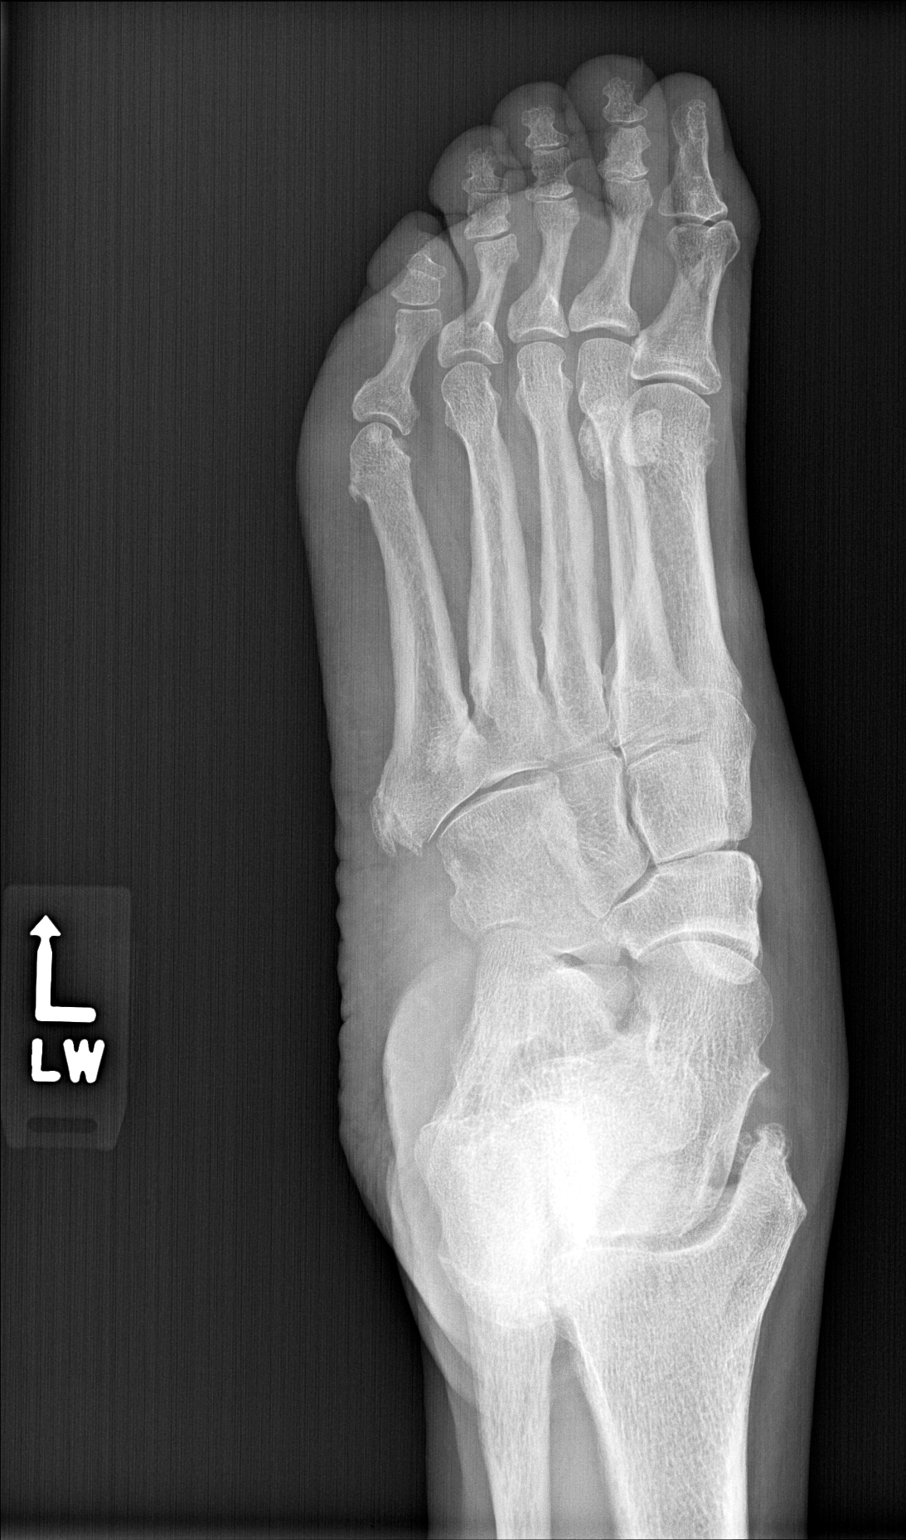

[foot lat]
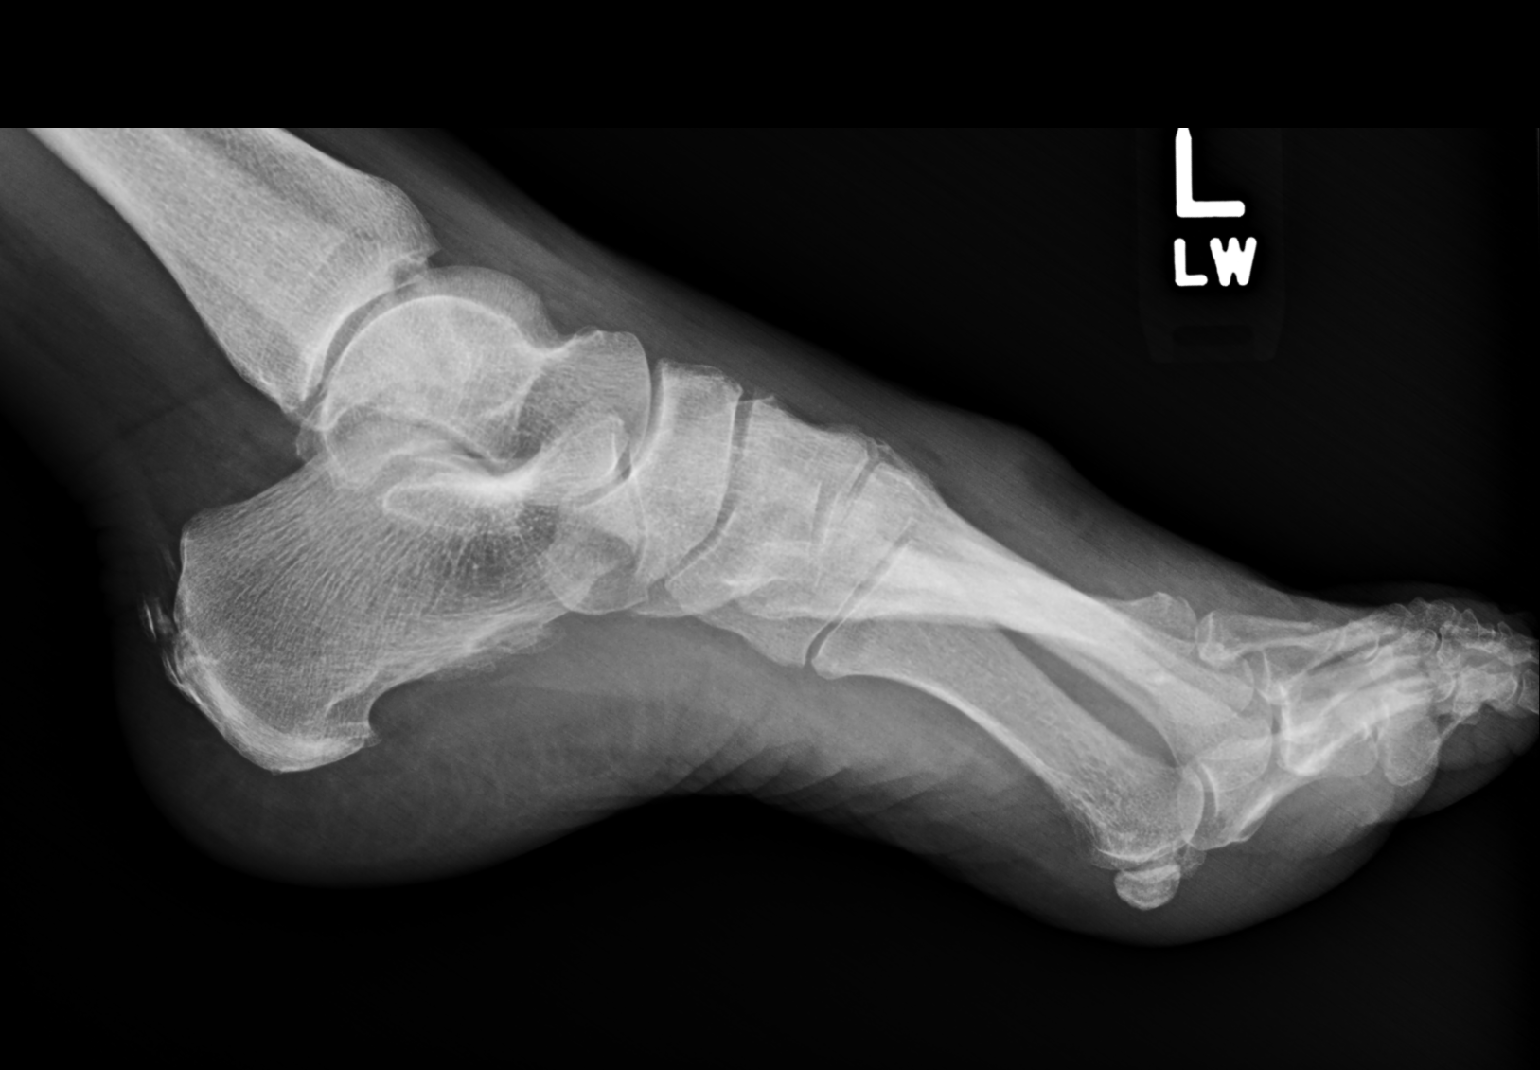

[3 of 3 positions shown; findings below may reference images not displayed]

FINDINGS: There is no evidence of an acute fracture or dislocation. A chronic
fracture deformity is seen involving the base of the fifth left
metatarsal. Mild degenerative changes seen along the dorsal aspect
of the mid left foot. Mild focal dorsal soft tissue swelling is seen
along the mid to distal left foot.
IMPRESSION: Chronic fracture deformity involving the base of the fifth left
metatarsal.

## 2022-07-11 ENCOUNTER — Other Ambulatory Visit: Payer: Self-pay | Admitting: *Deleted

## 2022-07-11 MED ORDER — PEG 3350-KCL-NABCB-NACL-NASULF 236 G PO SOLR: 4000.0000 mL | Freq: Once | ORAL | 0 refills | Status: AC

## 2022-07-14 ENCOUNTER — Encounter: Payer: Self-pay | Admitting: Gastroenterology

## 2022-07-15 ENCOUNTER — Encounter
Admission: RE | Disposition: A | Discharge status: Home / Self Care | Payer: Self-pay | Source: Home / Self Care | Attending: Gastroenterology

## 2022-07-15 ENCOUNTER — Ambulatory Visit
Admission: RE | Admit: 2022-07-15 | Discharge: 2022-07-15 | Disposition: A | Discharge status: Home / Self Care | Payer: Medicare Other | Attending: Gastroenterology | Admitting: Gastroenterology

## 2022-07-15 ENCOUNTER — Ambulatory Visit: Payer: Medicare Other | Admitting: Anesthesiology

## 2022-07-15 DIAGNOSIS — Z853 Personal history of malignant neoplasm of breast: Secondary | ICD-10-CM | POA: Diagnosis not present

## 2022-07-15 DIAGNOSIS — D124 Benign neoplasm of descending colon: Secondary | ICD-10-CM | POA: Insufficient documentation

## 2022-07-15 DIAGNOSIS — Z923 Personal history of irradiation: Secondary | ICD-10-CM | POA: Insufficient documentation

## 2022-07-15 DIAGNOSIS — Z09 Encounter for follow-up examination after completed treatment for conditions other than malignant neoplasm: Secondary | ICD-10-CM | POA: Diagnosis not present

## 2022-07-15 DIAGNOSIS — I10 Essential (primary) hypertension: Secondary | ICD-10-CM | POA: Diagnosis not present

## 2022-07-15 DIAGNOSIS — Z90710 Acquired absence of both cervix and uterus: Secondary | ICD-10-CM | POA: Insufficient documentation

## 2022-07-15 DIAGNOSIS — Z860100 Personal history of colon polyps, unspecified: Secondary | ICD-10-CM

## 2022-07-15 DIAGNOSIS — Z87891 Personal history of nicotine dependence: Secondary | ICD-10-CM | POA: Insufficient documentation

## 2022-07-15 DIAGNOSIS — Z1211 Encounter for screening for malignant neoplasm of colon: Secondary | ICD-10-CM | POA: Insufficient documentation

## 2022-07-15 DIAGNOSIS — Z08 Encounter for follow-up examination after completed treatment for malignant neoplasm: Secondary | ICD-10-CM | POA: Insufficient documentation

## 2022-07-15 DIAGNOSIS — K5730 Diverticulosis of large intestine without perforation or abscess without bleeding: Secondary | ICD-10-CM | POA: Insufficient documentation

## 2022-07-15 DIAGNOSIS — K644 Residual hemorrhoidal skin tags: Secondary | ICD-10-CM | POA: Diagnosis not present

## 2022-07-15 DIAGNOSIS — D120 Benign neoplasm of cecum: Secondary | ICD-10-CM | POA: Diagnosis not present

## 2022-07-15 DIAGNOSIS — R7303 Prediabetes: Secondary | ICD-10-CM | POA: Diagnosis not present

## 2022-07-15 HISTORY — PX: COLONOSCOPY WITH PROPOFOL: SHX5780

## 2022-07-15 SURGERY — COLONOSCOPY WITH PROPOFOL
Anesthesia: General

## 2022-07-15 MED ORDER — PROPOFOL 10 MG/ML IV BOLUS
INTRAVENOUS | Status: AC
  Filled 2022-07-15: qty 20

## 2022-07-15 MED ORDER — PROPOFOL 10 MG/ML IV BOLUS
INTRAVENOUS | Status: DC | PRN
  Administered 2022-07-15: 70 mg via INTRAVENOUS

## 2022-07-15 MED ORDER — LIDOCAINE HCL (CARDIAC) PF 100 MG/5ML IV SOSY
PREFILLED_SYRINGE | INTRAVENOUS | Status: DC | PRN
  Administered 2022-07-15: 50 mg via INTRAVENOUS

## 2022-07-15 MED ORDER — PROPOFOL 500 MG/50ML IV EMUL
INTRAVENOUS | Status: DC | PRN
  Administered 2022-07-15: 75 ug/kg/min via INTRAVENOUS

## 2022-07-15 MED ORDER — SODIUM CHLORIDE 0.9 % IV SOLN
INTRAVENOUS | Status: DC
  Administered 2022-07-15: 20 mL/h via INTRAVENOUS

## 2022-07-15 NOTE — Anesthesia Postprocedure Evaluation (Signed)
Anesthesia Post Note  Patient: Tracy Lawrence  Procedure(s) Performed: COLONOSCOPY WITH PROPOFOL  Patient location during evaluation: Endoscopy Anesthesia Type: General Level of consciousness: awake and alert Pain management: pain level controlled Vital Signs Assessment: post-procedure vital signs reviewed and stable Respiratory status: spontaneous breathing, nonlabored ventilation, respiratory function stable and patient connected to nasal cannula oxygen Cardiovascular status: blood pressure returned to baseline and stable Postop Assessment: no apparent nausea or vomiting Anesthetic complications: no  No notable events documented.   Last Vitals:  Vitals:   07/15/22 0913 07/15/22 0921  BP: 98/73 109/89  Pulse: 61 61  Resp: 12 18  Temp:    SpO2: 100% 100%    Last Pain:  Vitals:   07/15/22 0921  TempSrc:   PainSc: 0-No pain                 Stephanie Coup

## 2022-07-15 NOTE — Op Note (Signed)
[facility] Patient Name: Tracy Lawrence Procedure Date: 07/15/2022 8:03 AM MRN: [id number] Account #: [id number] Date of Birth: Oct 27, 1940 Admit Type: Outpatient Age: 82 Room: [facility] ENDO ROOM 2 Gender: Female Note Status: Finalized Instrument Name: Peds Colonoscope [id number] Procedure:             Colonoscopy Indications:           Surveillance: Personal history of adenomatous polyps                         on last colonoscopy 3 years ago, Last colonoscopy: May                         2021 Providers:             Toney Reil MD, MD Medicines:             General Anesthesia Complications:         No immediate complications. Estimated blood loss: None. Procedure:             Pre-Anesthesia Assessment:                        - Prior to the procedure, a History and Physical was                         performed, and patient medications and allergies were                         reviewed. The patient is competent. The risks and                         benefits of the procedure and the sedation options and                         risks were discussed with the patient. All questions                         were answered and informed consent was obtained.                         Patient identification and proposed procedure were                         verified by the physician, the nurse, the                         anesthesiologist, the anesthetist and the technician                         in the pre-procedure area in the procedure room in the                         endoscopy suite. Mental Status Examination: alert and                         oriented. Airway Examination: normal oropharyngeal                         airway and neck  mobility. Respiratory Examination:                         clear to auscultation. CV Examination: normal.                         Prophylactic Antibiotics: The patient does not require                          prophylactic antibiotics. Prior Anticoagulants: The                         patient has taken no anticoagulant or antiplatelet                         agents. ASA Grade Assessment: III - A patient with                         severe systemic disease. After reviewing the risks and                         benefits, the patient was deemed in satisfactory                         condition to undergo the procedure. The anesthesia                         plan was to use general anesthesia. Immediately prior                         to administration of medications, the patient was                         re-assessed for adequacy to receive sedatives. The                         heart rate, respiratory rate, oxygen saturations,                         blood pressure, adequacy of pulmonary ventilation, and                         response to care were monitored throughout the                         procedure. The physical status of the patient was                         re-assessed after the procedure.                        After obtaining informed consent, the colonoscope was                         passed under direct vision. Throughout the procedure,                         the patient's blood pressure, pulse, and oxygen  saturations were monitored continuously. The                         Colonoscope was introduced through the anus and                         advanced to the the cecum, identified by appendiceal                         orifice and ileocecal valve. The colonoscopy was                         performed without difficulty. The patient tolerated                         the procedure well. The quality of the bowel                         preparation was evaluated using the BBPS [facility] Bowel                         Preparation Scale) with scores of: Right Colon = 3,                         Transverse Colon = 3 and Left Colon = 3 (entire mucosa                          seen well with no residual staining, small fragments                         of stool or opaque liquid). The total BBPS score                         equals 9. The ileocecal valve, appendiceal orifice,                         and rectum were photographed. Findings:      The perianal and digital rectal examinations were normal. Pertinent       negatives include normal sphincter tone and no palpable rectal lesions.      Two sessile polyps were found in the cecum. The polyps were 7 to 8 mm in       size. These polyps were removed with a cold snare. Resection and       retrieval were complete. Estimated blood loss was minimal. To prevent       bleeding after the polypectomy, one hemostatic clip was successfully       placed (MR safe). Clip manufacturer: [employer]. There was no       bleeding at the end of the procedure.      Three sessile polyps were found in the descending colon. The polyps were       3 to 7 mm in size. These polyps were removed with a cold snare.       Resection and retrieval were complete. Estimated blood loss: none.      Many large-mouthed diverticula were found in the recto-sigmoid colon and       sigmoid colon.      Non-bleeding external hemorrhoids were found  during retroflexion. The       hemorrhoids were medium-sized. Impression:            - Two 7 to 8 mm polyps in the cecum, removed with a                         cold snare. Resected and retrieved. Clip manufacturer:                         [employer]. Clip (MR safe) was placed.                        - Three 3 to 7 mm polyps in the descending colon,                         removed with a cold snare. Resected and retrieved.                        - Diverticulosis in the recto-sigmoid colon and in the                         sigmoid colon.                        - Non-bleeding external hemorrhoids. Recommendation:        - Discharge patient to home (with escort).                        -  Resume previous diet today.                        - Continue present medications.                        - Await pathology results.                        - Repeat colonoscopy in 3 - 5 years for surveillance                         of multiple polyps. Procedure Code(s):     --- Professional ---                        [phone], Colonoscopy, flexible; with removal of                         tumor(s), polyp(s), or other lesion(s) by snare                         technique Diagnosis Code(s):     --- Professional ---                        Z86.010, Personal history of colonic polyps                        D12.0, Benign neoplasm of cecum                        D12.4, Benign neoplasm of descending colon  K64.4, Residual hemorrhoidal skin tags                        K57.30, Diverticulosis of large intestine without                         perforation or abscess without bleeding CPT copyright 2022 American Medical Association. All rights reserved. The codes documented in this report are preliminary and upon coder review may  be revised to meet current compliance requirements. Dr. Libby Maw Toney Reil MD, MD 07/15/2022 8:39:19 AM This report has been signed electronically. Number of Addenda: 0 Note Initiated On: 07/15/2022 8:03 AM Scope Withdrawal Time: 0 hours 14 minutes 21 seconds  Total Procedure Duration: 0 hours 17 minutes 55 seconds  Estimated Blood Loss:  Estimated blood loss: none.      [facility]

## 2022-07-15 NOTE — H&P (Signed)
Arlyss Repress, MD [address]  [address]  [place], [place] [address]  Main: [phone]  Fax: [phone] Pager: [phone]  Primary Care Physician:  Allegra Grana, FNP Primary Gastroenterologist:  Dr. Arlyss Repress  Pre-Procedure History & Physical: HPI:  Tracy Lawrence is a 82 y.o. female is here for an colonoscopy.   Past Medical History:  Diagnosis Date   Anemia    Arthritis    Breast cancer (HCC) 2002   right breast   Breast cancer (HCC) 2011   left breast   Cancer (HCC)    Family history of breast cancer    Family history of lung cancer    Family history of non-Hodgkin's lymphoma    Family history of prostate cancer    Hypertension    Malignant neoplasm of right breast [facility])    Personal history of radiation therapy 2002, 2011   right and left breast   Pre-diabetes     Past Surgical History:  Procedure Laterality Date   ABDOMINAL HYSTERECTOMY  2018   BREAST BIOPSY Right 10/04/2018   u/s bx, vision marker, path pending   BREAST LUMPECTOMY Right 2002   breast ca with rad   BREAST LUMPECTOMY Left 2011   breast ca with rad   BREAST SURGERY  2011,2002   CATARACT EXTRACTION  2015   COLONOSCOPY     COLONOSCOPY WITH PROPOFOL N/A 06/14/2019   Procedure: COLONOSCOPY WITH PROPOFOL;  Surgeon: Toney Reil, MD;  Location: [facility] ENDOSCOPY;  Service: Gastroenterology;  Laterality: N/A;   ESOPHAGOGASTRODUODENOSCOPY (EGD) WITH PROPOFOL N/A 06/14/2019   Procedure: ESOPHAGOGASTRODUODENOSCOPY (EGD) WITH PROPOFOL;  Surgeon: Toney Reil, MD;  Location: [facility] ENDOSCOPY;  Service: Gastroenterology;  Laterality: N/A;   EYE SURGERY     MASTECTOMY     SIMPLE MASTECTOMY WITH AXILLARY SENTINEL NODE BIOPSY Right 01/06/2020   Procedure: SIMPLE MASTECTOMY WITH AXILLARY SENTINEL NODE BIOPSY;  Surgeon: Earline Mayotte, MD;  Location: [facility] ORS;  Service: General;  Laterality: Right;    Prior to Admission medications   Medication Sig Start Date End Date  Taking? Authorizing Provider  acetaminophen (TYLENOL) 500 MG tablet Take 1,000 mg by mouth every 6 (six) hours as needed for moderate pain or headache.   Yes [provider]  allopurinol (ZYLOPRIM) 100 MG tablet TAKE 1 TABLET(100 MG) BY MOUTH DAILY 02/21/22  Yes Arnett, Lyn Records, FNP  amLODipine (NORVASC) 2.5 MG tablet Take 2.5 mg by mouth daily. 03/02/22 03/02/23 Yes [provider]  anastrozole (ARIMIDEX) 1 MG tablet Take 1 tablet (1 mg total) by mouth daily. 03/14/22  Yes Rickard Patience, MD  Calcium Carb-Cholecalciferol (CALCIUM 600 + D PO) Take 1 tablet by mouth daily.   Yes [provider]  calcium carbonate (TUMS - DOSED IN MG ELEMENTAL CALCIUM) 500 MG chewable tablet Chew 1,000 mg by mouth daily as needed for indigestion or heartburn.   Yes [provider]  Carboxymethylcellul-Glycerin (LUBRICATING EYE DROPS OP) Place 1 drop into both eyes daily as needed (irritated eyes).   Yes [provider]  escitalopram (LEXAPRO) 10 MG tablet Take 1 tablet (10 mg total) by mouth daily. 01/19/22  Yes Allegra Grana, FNP  ezetimibe (ZETIA) 10 MG tablet Take 1 tablet (10 mg total) by mouth daily. 03/22/22  Yes Allegra Grana, FNP  ferrous sulfate 325 (65 FE) MG tablet Take 1 tablet (325 mg total) by mouth 2 (two) times daily with a meal. 03/10/21  Yes Rickard Patience, MD  hydrochlorothiazide (MICROZIDE)  12.5 MG capsule TAKE 1 CAPSULE(12.5 MG) BY MOUTH DAILY 03/22/22  Yes Allegra Grana, FNP  losartan (COZAAR) 100 MG tablet TAKE 1 TABLET(100 MG) BY MOUTH DAILY 06/03/21  Yes Arnett, Lyn Records, FNP  Multiple Vitamins-Minerals (EYE VITAMINS PO) Take 1 tablet by mouth daily.    Yes [provider]  dapagliflozin propanediol (FARXIGA) 10 MG TABS tablet Take 1 tablet (10 mg total) by mouth daily. 05/03/22   Allegra Grana, FNP  rosuvastatin (CRESTOR) 40 MG tablet TAKE 1 TABLET BY MOUTH EVERY DAY FOR CHOLESTEROL Patient not taking: Reported on 07/15/2022 03/22/22    Allegra Grana, FNP    Allergies as of 03/07/2022   (No Known Allergies)    Family History  Problem Relation Age of Onset   Heart attack Mother    Cancer Father    Lung cancer Father    Breast cancer Sister 91       dx again 39   Heart attack Sister    Non-Hodgkin's lymphoma Sister 65   Heart attack Sister    Dementia Sister 21   Breast cancer Paternal Aunt    Breast cancer Maternal Aunt    Prostate cancer Paternal Grandfather        metastic, d. 23s   Colon cancer Neg Hx     Social History   Socioeconomic History   Marital status: Married    Spouse name: Not on file   Number of children: Not on file   Years of education: Not on file   Highest education level: Not on file  Occupational History   Not on file  Tobacco Use   Smoking status: Former    Packs/day: 1.00    Years: 12.00    Additional pack years: 0.00    Total pack years: 12.00    Types: Cigarettes    Quit date: 03/18/1979    Years since quitting: 43.3   Smokeless tobacco: Never   Tobacco comments:    quit 40 years ago  Vaping Use   Vaping Use: Never used  Substance and Sexual Activity   Alcohol use: Yes    Comment: rarely   Drug use: Never   Sexual activity: Not on file  Other Topics Concern   Not on file  Social History Narrative   From [place]      Retired      Social Determinants of Health   Financial Resource Strain: Low Risk  (11/03/2021)   Overall Financial Resource Strain (CARDIA)    Difficulty of Paying Living Expenses: Not hard at all  Food Insecurity: No Food Insecurity (11/03/2021)   Hunger Vital Sign    Worried About Running Out of Food in the Last Year: Never true    Ran Out of Food in the Last Year: Never true  Transportation Needs: No Transportation Needs (11/03/2021)   PRAPARE - Administrator, Civil Service (Medical): No    Lack of Transportation (Non-Medical): No  Physical Activity: Insufficiently Active (11/03/2021)   Exercise Vital Sign    Days of  Exercise per Week: 3 days    Minutes of Exercise per Session: 30 min  Stress: No Stress Concern Present (11/03/2021)   [employer] of Occupational Health - Occupational Stress Questionnaire    Feeling of Stress : Not at all  Social Connections: Moderately Integrated (11/03/2021)   Social Connection and Isolation Panel [NHANES]    Frequency of Communication with Friends and Family: Three times a week    Frequency  of Social Gatherings with Friends and Family: More than three times a week    Attends Religious Services: More than 4 times per year    Active Member of [employer] or Organizations: No    Attends Banker Meetings: Never    Marital Status: Married  Catering manager Violence: Not At Risk (11/03/2021)   Humiliation, Afraid, Rape, and Kick questionnaire    Fear of Current or Ex-Partner: No    Emotionally Abused: No    Physically Abused: No    Sexually Abused: No    Review of Systems: See HPI, otherwise negative ROS  Physical Exam: BP (!) 147/66   Pulse 70   Temp (!) 96 F (35.6 C) (Temporal)   Resp 20   Ht 5\' 1"  (1.549 m)   Wt 68.5 kg   LMP  (LMP Unknown)   SpO2 100%   BMI 28.53 kg/m  General:   Alert,  pleasant and cooperative in NAD Head:  Normocephalic and atraumatic. Neck:  Supple; no masses or thyromegaly. Lungs:  Clear throughout to auscultation.    Heart:  Regular rate and rhythm. Abdomen:  Soft, nontender and nondistended. Normal bowel sounds, without guarding, and without rebound.   Neurologic:  Alert and  oriented x4;  grossly normal neurologically.  Impression/Plan: Tracy Lawrence is here for an colonoscopy to be performed for h/o colon adenomas  Risks, benefits, limitations, and alternatives regarding  colonoscopy have been reviewed with the patient.  Questions have been answered.  All parties agreeable.   Lannette Donath, MD  07/15/2022, 7:42 AM

## 2022-07-15 NOTE — Anesthesia Preprocedure Evaluation (Signed)
Anesthesia Evaluation  Patient identified by MRN, date of birth, ID band Patient awake    Reviewed: Allergy & Precautions, H&P , NPO status , Patient's Chart, lab work & pertinent test results  History of Anesthesia Complications Negative for: history of anesthetic complications  Airway Mallampati: III  TM Distance: <3 FB Neck ROM: limited    Dental  (+) Chipped   Pulmonary neg shortness of breath, former smoker   Pulmonary exam normal        Cardiovascular Exercise Tolerance: Good hypertension, Normal cardiovascular exam     Neuro/Psych  PSYCHIATRIC DISORDERS      negative neurological ROS     GI/Hepatic negative GI ROS, Neg liver ROS,,,  Endo/Other  diabetes, Well Controlled, Type 2    Renal/GU Renal disease     Musculoskeletal  (+) Arthritis ,    Abdominal   Peds  Hematology negative hematology ROS (+)   Anesthesia Other Findings Past Medical History: No date: Anemia No date: Arthritis 2002: Breast cancer (HCC)     Comment:  right breast 2011: Breast cancer (HCC)     Comment:  left breast No date: Cancer (HCC) No date: Family history of breast cancer No date: Family history of lung cancer No date: Family history of non-Hodgkin's lymphoma No date: Family history of prostate cancer No date: Hypertension No date: Malignant neoplasm of right breast (HCC) 2002, 2011: Personal history of radiation therapy     Comment:  right and left breast No date: Pre-diabetes  Past Surgical History: 2018: ABDOMINAL HYSTERECTOMY 10/04/2018: BREAST BIOPSY; Right     Comment:  u/s bx, vision marker, path pending 2002: BREAST LUMPECTOMY; Right     Comment:  breast ca with rad 2011: BREAST LUMPECTOMY; Left     Comment:  breast ca with rad 2011,2002: BREAST SURGERY 2015: CATARACT EXTRACTION No date: COLONOSCOPY 06/14/2019: COLONOSCOPY WITH PROPOFOL; N/A     Comment:  Procedure: COLONOSCOPY WITH PROPOFOL;  Surgeon:  Toney Reil, MD;  Location: [facility] ENDOSCOPY;  Service:               Gastroenterology;  Laterality: N/A; 06/14/2019: ESOPHAGOGASTRODUODENOSCOPY (EGD) WITH PROPOFOL; N/A     Comment:  Procedure: ESOPHAGOGASTRODUODENOSCOPY (EGD) WITH               PROPOFOL;  Surgeon: Toney Reil, MD;  Location:               [facility] ENDOSCOPY;  Service: Gastroenterology;  Laterality:               N/A;  BMI    Body Mass Index: 30.61 kg/m      Reproductive/Obstetrics negative OB ROS                             Anesthesia Physical Anesthesia Plan  ASA: 3  Anesthesia Plan: General   Post-op Pain Management:    Induction: Intravenous  PONV Risk Score and Plan: Ondansetron, Propofol infusion and TIVA  Airway Management Planned: Nasal Cannula and Natural Airway  Additional Equipment:   Intra-op Plan:   Post-operative Plan: Extubation in OR  Informed Consent: I have reviewed the patients History and Physical, chart, labs and discussed the procedure including the risks, benefits and alternatives for the proposed anesthesia with the patient or authorized representative who has indicated his/her understanding and acceptance.     Dental Advisory  Given  Plan Discussed with: Anesthesiologist, CRNA and Surgeon  Anesthesia Plan Comments: (Patient consented for risks of anesthesia including but not limited to:  - adverse reactions to medications - damage to eyes, teeth, lips or other oral mucosa - nerve damage due to positioning  - sore throat or hoarseness - Damage to heart, brain, nerves, lungs, other parts of body or loss of life  Patient voiced understanding.)        Anesthesia Quick Evaluation

## 2022-07-15 NOTE — Transfer of Care (Signed)
Immediate Anesthesia Transfer of Care Note  Patient: Tracy Lawrence  Procedure(s) Performed: COLONOSCOPY WITH PROPOFOL  Patient Location: PACU and Endoscopy Unit  Anesthesia Type:General  Level of Consciousness: drowsy and patient cooperative  Airway & Oxygen Therapy: Patient Spontanous Breathing  Post-op Assessment: Report given to RN and Post -op Vital signs reviewed and stable  Post vital signs: Reviewed and stable  Last Vitals:  Vitals Value Taken Time  BP 92/41 07/15/22 0843  Temp    Pulse 61 07/15/22 0843  Resp 16 07/15/22 0843  SpO2 99 % 07/15/22 0843    Last Pain:  Vitals:   07/15/22 0843  TempSrc:   PainSc: Asleep         Complications: No notable events documented.

## 2022-07-18 ENCOUNTER — Encounter: Payer: Self-pay | Admitting: Gastroenterology

## 2022-08-05 ENCOUNTER — Other Ambulatory Visit: Payer: Self-pay | Admitting: Family

## 2022-08-05 DIAGNOSIS — I10 Essential (primary) hypertension: Secondary | ICD-10-CM

## 2022-08-17 ENCOUNTER — Other Ambulatory Visit: Payer: Medicare Other

## 2022-08-17 DIAGNOSIS — E119 Type 2 diabetes mellitus without complications: Secondary | ICD-10-CM | POA: Diagnosis not present

## 2022-08-17 DIAGNOSIS — E785 Hyperlipidemia, unspecified: Secondary | ICD-10-CM | POA: Diagnosis not present

## 2022-08-17 LAB — LIPID PANEL
Cholesterol: 123 mg/dL (ref 0–200)
HDL: 46.8 mg/dL (ref >39.00)
LDL Cholesterol: 57 mg/dL (ref 0–99)
NonHDL: 76.48
Total CHOL/HDL Ratio: 3
Triglycerides: 96 mg/dL (ref 0.0–149.0)
VLDL: 19.2 mg/dL (ref 0.0–40.0)

## 2022-08-17 LAB — HEMOGLOBIN A1C: Hgb A1c MFr Bld: 6.3 % (ref 4.6–6.5)

## 2022-08-22 ENCOUNTER — Ambulatory Visit
Admission: RE | Admit: 2022-08-22 | Discharge: 2022-08-22 | Disposition: A | Discharge status: Home / Self Care | Payer: Medicare Other | Source: Ambulatory Visit | Attending: Family | Admitting: Family

## 2022-08-22 ENCOUNTER — Encounter: Payer: Self-pay | Admitting: Family

## 2022-08-22 ENCOUNTER — Ambulatory Visit (INDEPENDENT_AMBULATORY_CARE_PROVIDER_SITE_OTHER): Payer: Medicare Other | Admitting: Family

## 2022-08-22 ENCOUNTER — Ambulatory Visit
Admission: RE | Admit: 2022-08-22 | Discharge: 2022-08-22 | Disposition: A | Discharge status: Home / Self Care | Payer: Medicare Other | Attending: Family | Admitting: Family

## 2022-08-22 VITALS — BP 122/70 | HR 72 | Temp 97.8°F | Ht 61.0 in | Wt 153.4 lb

## 2022-08-22 DIAGNOSIS — M25571 Pain in right ankle and joints of right foot: Secondary | ICD-10-CM

## 2022-08-22 DIAGNOSIS — I10 Essential (primary) hypertension: Secondary | ICD-10-CM | POA: Diagnosis not present

## 2022-08-22 DIAGNOSIS — E785 Hyperlipidemia, unspecified: Secondary | ICD-10-CM | POA: Diagnosis not present

## 2022-08-22 NOTE — Assessment & Plan Note (Signed)
Chronic, stable.  Continue  hydrochlorothiazide 12.5 mg qd, losartan 100 mg qd

## 2022-08-22 NOTE — Assessment & Plan Note (Signed)
Lab Results  Component Value Date   LDLCALC 57 08/17/2022   Excellent control, continue Crestor 40 mg qd

## 2022-08-22 NOTE — Patient Instructions (Signed)
For ankle pain, please go to outpatient imaging center [address] to have your x-rays obtained.  You may do this anytime in the next couple of weeks I suspect you had a soft tissue sprain.  Please ice for 20 minutes twice daily.  I provided you with an Ace wrap to wear during the daytime.  Please ensure that you are not walking barefoot and wearing supportive shoes during the day.  You may also schedule Tylenol arthritis 650 mg tablet in the morning.  You may also take an additional dose in the afternoon if you were to need.  If no improvement with the above conservative measures, please let me know so we can consider physical therapy, podiatry referral.  Bone density and mammogram as scheduled.

## 2022-08-22 NOTE — Progress Notes (Signed)
Assessment & Plan:  Acute right ankle pain Assessment & Plan: Acute.  Concern for ligamental injury.  Discussed wearing from supportive shoes, Ace wrap, icing regimen and scheduled Tylenol arthritis.  Pending x-rays to evaluate for underlying stress fracture.  If no improvement, will arrange podiatry consult, physical therapy  Orders: -     DG Foot Complete Right; Future -     DG Ankle Complete Right; Future  Essential hypertension Assessment & Plan: Chronic, stable.  Continue  hydrochlorothiazide 12.5 mg qd, losartan 100 mg qd    Hyperlipidemia, unspecified hyperlipidemia type Assessment & Plan: Lab Results  Component Value Date   LDLCALC 57 08/17/2022   Excellent control, continue Crestor 40 mg qd      Return precautions given.   Risks, benefits, and alternatives of the medications and treatment plan prescribed today were discussed, and patient expressed understanding.   Education regarding symptom management and diagnosis given to patient on AVS either electronically or printed.  Return in about 3 months (around 11/22/2022).  Rennie Plowman, FNP  Subjective:    Patient ID: Tracy Lawrence, female    DOB: 02/17/1940, 82 y.o.   MRN: [id number]  CC: Tracy Lawrence is a 82 y.o. female who presents today for follow up.   HPI: Accompanied by husband today.    Complains of right foot pain x 4 weeks No injury No associated numbness. No pain under foot.  She has tried Tylenol periodically with temporary relief.  She walks barefoot during the day Pain with walking and steps; pain with forward flexion.      Allergies: Patient has no known allergies. Current Outpatient Medications on File Prior to Visit  Medication Sig Dispense Refill   acetaminophen (TYLENOL) 500 MG tablet Take 1,000 mg by mouth every 6 (six) hours as needed for moderate pain or headache.     allopurinol (ZYLOPRIM) 100 MG tablet TAKE 1 TABLET(100 MG) BY MOUTH DAILY 90 tablet 3   amLODipine  (NORVASC) 2.5 MG tablet Take 2.5 mg by mouth daily.     anastrozole (ARIMIDEX) 1 MG tablet Take 1 tablet (1 mg total) by mouth daily. 90 tablet 2   Calcium Carb-Cholecalciferol (CALCIUM 600 + D PO) Take 1 tablet by mouth daily.     calcium carbonate (TUMS - DOSED IN MG ELEMENTAL CALCIUM) 500 MG chewable tablet Chew 1,000 mg by mouth daily as needed for indigestion or heartburn.     Carboxymethylcellul-Glycerin (LUBRICATING EYE DROPS OP) Place 1 drop into both eyes daily as needed (irritated eyes).     dapagliflozin propanediol (FARXIGA) 10 MG TABS tablet Take 1 tablet (10 mg total) by mouth daily. 90 tablet 3   escitalopram (LEXAPRO) 10 MG tablet Take 1 tablet (10 mg total) by mouth daily. 90 tablet 3   ezetimibe (ZETIA) 10 MG tablet Take 1 tablet (10 mg total) by mouth daily. 90 tablet 3   ferrous sulfate 325 (65 FE) MG tablet Take 1 tablet (325 mg total) by mouth 2 (two) times daily with a meal. 60 tablet 0   hydrochlorothiazide (MICROZIDE) 12.5 MG capsule TAKE 1 CAPSULE(12.5 MG) BY MOUTH DAILY 90 capsule 3   losartan (COZAAR) 100 MG tablet TAKE 1 TABLET(100 MG) BY MOUTH DAILY 90 tablet 3   Multiple Vitamins-Minerals (EYE VITAMINS PO) Take 1 tablet by mouth daily.      rosuvastatin (CRESTOR) 40 MG tablet TAKE 1 TABLET BY MOUTH EVERY DAY FOR CHOLESTEROL 90 tablet 3   No current facility-administered medications on file prior  to visit.    Review of Systems  Constitutional:  Negative for chills and fever.  Respiratory:  Negative for cough.   Cardiovascular:  Negative for chest pain and palpitations.  Gastrointestinal:  Negative for nausea and vomiting.  Musculoskeletal:  Positive for arthralgias. Negative for joint swelling.  Neurological:  Negative for numbness.      Objective:    BP 122/70   Pulse 72   Temp 97.8 F (36.6 C) (Oral)   Ht 5\' 1"  (1.549 m)   Wt 153 lb 6.4 oz (69.6 kg)   LMP  (LMP Unknown)   SpO2 96%   BMI 28.98 kg/m  BP Readings from Last 3 Encounters:  08/22/22  122/70  07/15/22 109/89  03/22/22 128/80   Wt Readings from Last 3 Encounters:  08/22/22 153 lb 6.4 oz (69.6 kg)  07/15/22 151 lb (68.5 kg)  03/22/22 150 lb 12.8 oz (68.4 kg)    Physical Exam Vitals reviewed.  Constitutional:      Appearance: She is well-developed.  Eyes:     Conjunctiva/sclera: Conjunctivae normal.  Cardiovascular:     Rate and Rhythm: Normal rate and regular rhythm.     Pulses: Normal pulses.     Heart sounds: Normal heart sounds.  Pulmonary:     Effort: Pulmonary effort is normal.     Breath sounds: Normal breath sounds. No wheezing, rhonchi or rales.  Musculoskeletal:     Right lower leg: No edema.     Left lower leg: No edema.       Feet:  Feet:     Comments: Right ankle:  No pain with Squeeze test at mid calf. No pain over lateral malleolus, medial malleolus, base of the fifth metatarsal or navicular bone. Pain elicited with external rotation stress test. Able to plantar and dorsi flex without pain. Sensation intact equally bilateral lower extremities. Palpable pedal pulses.   Skin:    General: Skin is warm and dry.  Neurological:     Mental Status: She is alert.  Psychiatric:        Speech: Speech normal.        Behavior: Behavior normal.        Thought Content: Thought content normal.

## 2022-08-22 NOTE — Assessment & Plan Note (Signed)
Acute.  Concern for ligamental injury.  Discussed wearing from supportive shoes, Ace wrap, icing regimen and scheduled Tylenol arthritis.  Pending x-rays to evaluate for underlying stress fracture.  If no improvement, will arrange podiatry consult, physical therapy

## 2022-09-15 LAB — HM DIABETES EYE EXAM

## 2022-10-14 ENCOUNTER — Inpatient Hospital Stay: Payer: Medicare Other | Attending: Radiation Oncology

## 2022-10-14 DIAGNOSIS — Z923 Personal history of irradiation: Secondary | ICD-10-CM | POA: Diagnosis not present

## 2022-10-14 DIAGNOSIS — Z79811 Long term (current) use of aromatase inhibitors: Secondary | ICD-10-CM | POA: Insufficient documentation

## 2022-10-14 DIAGNOSIS — C50411 Malignant neoplasm of upper-outer quadrant of right female breast: Secondary | ICD-10-CM

## 2022-10-14 DIAGNOSIS — C50911 Malignant neoplasm of unspecified site of right female breast: Secondary | ICD-10-CM | POA: Insufficient documentation

## 2022-10-14 DIAGNOSIS — Z170 Estrogen receptor positive status [ER+]: Secondary | ICD-10-CM | POA: Diagnosis not present

## 2022-10-14 DIAGNOSIS — Z9011 Acquired absence of right breast and nipple: Secondary | ICD-10-CM | POA: Diagnosis not present

## 2022-10-14 DIAGNOSIS — D649 Anemia, unspecified: Secondary | ICD-10-CM | POA: Diagnosis not present

## 2022-10-14 LAB — CBC WITH DIFFERENTIAL/PLATELET
Abs Immature Granulocytes: 0.02 10*3/uL (ref 0.00–0.07)
Basophils Absolute: 0 10*3/uL (ref 0.0–0.1)
Basophils Relative: 1 %
Eosinophils Absolute: 0.1 10*3/uL (ref 0.0–0.5)
Eosinophils Relative: 2 %
HCT: 36.9 % (ref 36.0–46.0)
Hemoglobin: 11.5 g/dL — ABNORMAL LOW (ref 12.0–15.0)
Immature Granulocytes: 0 %
Lymphocytes Relative: 27 %
Lymphs Abs: 1.8 10*3/uL (ref 0.7–4.0)
MCH: 25.9 pg — ABNORMAL LOW (ref 26.0–34.0)
MCHC: 31.2 g/dL (ref 30.0–36.0)
MCV: 83.1 fL (ref 80.0–100.0)
Monocytes Absolute: 0.5 10*3/uL (ref 0.1–1.0)
Monocytes Relative: 8 %
Neutro Abs: 4.1 10*3/uL (ref 1.7–7.7)
Neutrophils Relative %: 62 %
Platelets: 192 10*3/uL (ref 150–400)
RBC: 4.44 MIL/uL (ref 3.87–5.11)
RDW: 13.7 % (ref 11.5–15.5)
WBC: 6.5 10*3/uL (ref 4.0–10.5)
nRBC: 0 % (ref 0.0–0.2)

## 2022-10-14 LAB — COMPREHENSIVE METABOLIC PANEL
ALT: 17 U/L (ref 0–44)
AST: 18 U/L (ref 15–41)
Albumin: 4.1 g/dL (ref 3.5–5.0)
Alkaline Phosphatase: 54 U/L (ref 38–126)
Anion gap: 9 (ref 5–15)
BUN: 41 mg/dL — ABNORMAL HIGH (ref 8–23)
CO2: 23 mmol/L (ref 22–32)
Calcium: 9.9 mg/dL (ref 8.9–10.3)
Chloride: 97 mmol/L — ABNORMAL LOW (ref 98–111)
Creatinine, Ser: 1.62 mg/dL — ABNORMAL HIGH (ref 0.44–1.00)
GFR, Estimated: 32 mL/min — ABNORMAL LOW (ref >60)
Glucose, Bld: 121 mg/dL — ABNORMAL HIGH (ref 70–99)
Potassium: 4.1 mmol/L (ref 3.5–5.1)
Sodium: 129 mmol/L — ABNORMAL LOW (ref 135–145)
Total Bilirubin: 0.5 mg/dL (ref 0.3–1.2)
Total Protein: 7.3 g/dL (ref 6.5–8.1)

## 2022-10-14 LAB — IRON AND TIBC
Iron: 65 ug/dL (ref 28–170)
Saturation Ratios: 24 % (ref 10.4–31.8)
TIBC: 267 ug/dL (ref 250–450)
UIBC: 202 ug/dL

## 2022-10-14 LAB — FERRITIN: Ferritin: 198 ng/mL (ref 11–307)

## 2022-10-14 NOTE — Telephone Encounter (Signed)
Please send refills for Farxiga to [facility] for pt's re-enrollment for 2025

## 2022-10-15 LAB — CANCER ANTIGEN 27.29: CA 27.29: 26.8 U/mL (ref 0.0–38.6)

## 2022-10-17 NOTE — Telephone Encounter (Signed)
NOTED

## 2022-10-19 ENCOUNTER — Encounter: Payer: Self-pay | Admitting: Oncology

## 2022-10-19 ENCOUNTER — Inpatient Hospital Stay (HOSPITAL_BASED_OUTPATIENT_CLINIC_OR_DEPARTMENT_OTHER): Payer: Medicare Other | Admitting: Oncology

## 2022-10-19 VITALS — BP 130/61 | HR 77 | Temp 97.9°F | Resp 18 | Wt 154.1 lb

## 2022-10-19 DIAGNOSIS — E1122 Type 2 diabetes mellitus with diabetic chronic kidney disease: Secondary | ICD-10-CM

## 2022-10-19 DIAGNOSIS — Z79811 Long term (current) use of aromatase inhibitors: Secondary | ICD-10-CM

## 2022-10-19 DIAGNOSIS — C50411 Malignant neoplasm of upper-outer quadrant of right female breast: Secondary | ICD-10-CM | POA: Diagnosis not present

## 2022-10-19 DIAGNOSIS — N1831 Chronic kidney disease, stage 3a: Secondary | ICD-10-CM

## 2022-10-19 DIAGNOSIS — Z170 Estrogen receptor positive status [ER+]: Secondary | ICD-10-CM

## 2022-10-19 DIAGNOSIS — D631 Anemia in chronic kidney disease: Secondary | ICD-10-CM

## 2022-10-19 DIAGNOSIS — N183 Chronic kidney disease, stage 3 unspecified: Secondary | ICD-10-CM

## 2022-10-19 DIAGNOSIS — C50911 Malignant neoplasm of unspecified site of right female breast: Secondary | ICD-10-CM | POA: Diagnosis not present

## 2022-10-19 MED ORDER — ANASTROZOLE 1 MG PO TABS
1.0000 mg | ORAL_TABLET | Freq: Every day | ORAL | 2 refills | Status: DC
Start: 2022-10-19 — End: 2023-04-18

## 2022-10-19 NOTE — Assessment & Plan Note (Addendum)
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 Treatment if labs meet parameters Continue Arimidex 1 mg daily, plan 5 years, till Jan 2027 CA 27-29 remains stable.  Obtain annual unilateral left breast mammogram.  Bone health Normal bone density in August 2022, continue calcium 1200mg  and vitamin D supplementation. Obtain DEXA scheduled in Oct 2024

## 2022-10-19 NOTE — Assessment & Plan Note (Signed)
Anemia of CKD, also alpha thalassemia trait.  Lab Results  Component Value Date   HGB 11.5 (L) 10/14/2022   TIBC 267 10/14/2022   IRONPCTSAT 24 10/14/2022   FERRITIN 198 10/14/2022     Continue oral ferrous sulfate 325mg  BID.

## 2022-10-19 NOTE — Assessment & Plan Note (Addendum)
08/27/18 SPEP ordered by nephrology was negative for M protein.   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 Encourage oral hydration and avoid nephrotoxins.
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
## 2022-11-07 ENCOUNTER — Ambulatory Visit
Admission: RE | Admit: 2022-11-07 | Discharge: 2022-11-07 | Disposition: A | Discharge status: Home / Self Care | Payer: Medicare Other | Source: Ambulatory Visit | Attending: Oncology | Admitting: Oncology

## 2022-11-07 DIAGNOSIS — Z1231 Encounter for screening mammogram for malignant neoplasm of breast: Secondary | ICD-10-CM | POA: Diagnosis present

## 2022-11-07 DIAGNOSIS — Z853 Personal history of malignant neoplasm of breast: Secondary | ICD-10-CM | POA: Insufficient documentation

## 2022-11-07 DIAGNOSIS — Z79811 Long term (current) use of aromatase inhibitors: Secondary | ICD-10-CM | POA: Insufficient documentation

## 2022-11-10 ENCOUNTER — Ambulatory Visit: Payer: Medicare Other | Admitting: Emergency Medicine

## 2022-11-10 VITALS — Ht 61.0 in | Wt 152.0 lb

## 2022-11-10 DIAGNOSIS — Z0000 Encounter for general adult medical examination without abnormal findings: Secondary | ICD-10-CM

## 2022-11-10 NOTE — Patient Instructions (Addendum)
Tracy Lawrence , Thank you for taking time to come for your Medicare Wellness Visit. I appreciate your ongoing commitment to your health goals. Please review the following plan we discussed and let me know if I can assist you in the future.   Referrals/Orders/Follow-Ups/Clinician Recommendations: Get a tetanus shot at your next appointment on 11/22/22. Bring documentation of the shingles vaccines to the office so that we can update your chart. Keep up the good work!  This is a list of the screening recommended for you and due dates:  Health Maintenance  Topic Date Due   DTaP/Tdap/Td vaccine (1 - Tdap) Never done   Zoster (Shingles) Vaccine (1 of 2) Never done   COVID-19 Vaccine (3 - Pfizer risk series) 04/25/2019   Hemoglobin A1C  02/17/2023   Yearly kidney health urinalysis for diabetes  02/22/2023   Complete foot exam   02/22/2023   Eye exam for diabetics  09/15/2023   Yearly kidney function blood test for diabetes  10/14/2023   Mammogram  11/07/2023   Medicare Annual Wellness Visit  11/10/2023   Colon Cancer Screening  07/14/2025   DEXA scan (bone density measurement)  11/07/2027   Pneumonia Vaccine  Completed   Flu Shot  Completed   HPV Vaccine  Aged Out    Advanced directives: (Copy Requested) Please bring a copy of your health care power of attorney and living will to the office to be added to your chart at your convenience.  Next Medicare Annual Wellness Visit scheduled for next year: Yes, 11/16/23 @ 9:45am

## 2022-11-10 NOTE — Progress Notes (Signed)
Subjective:   Tracy Lawrence is a 82 y.o. female who presents for Medicare Annual (Subsequent) preventive examination.  Visit Complete: Virtual I connected with  Tracy Lawrence on 11/10/22 by a audio enabled telemedicine application and verified that I am speaking with the correct person using two identifiers.  Patient Location: Home  Provider Location: Home Office  I discussed the limitations of evaluation and management by telemedicine. The patient expressed understanding and agreed to proceed.  Vital Signs: Because this visit was a virtual/telehealth visit, some criteria may be missing or patient reported. Any vitals not documented were not able to be obtained and vitals that have been documented are patient reported.   Cardiac Risk Factors include: advanced age (>55men, >60 women);diabetes mellitus;hypertension;dyslipidemia     Objective:    Today's Vitals   11/10/22 0942 11/10/22 0943  Weight: 152 lb (68.9 kg)   Height: 5\' 1"  (1.549 m)   PainSc:  3    Body mass index is 28.72 kg/m.     11/10/2022    9:54 AM 10/19/2022   10:23 AM 07/15/2022    7:37 AM 03/14/2022    2:25 PM 11/03/2021    8:55 AM 09/09/2021    1:48 PM 03/10/2021    2:14 PM  Advanced Directives  Does Patient Have a Medical Advance Directive? Yes Yes Yes Yes Yes Yes Yes  Type of Advance Directive Living will Living will Healthcare Power of [place];Living will Living will Healthcare Power of [place];Living will  Living will  Does patient want to make changes to medical advance directive? No - Patient declined      No - Patient declined  Copy of Healthcare Power of Attorney in Chart?     No - copy requested      Current Medications (verified) Outpatient Encounter Medications as of 11/10/2022  Medication Sig   acetaminophen (TYLENOL) 500 MG tablet Take 1,000 mg by mouth every 6 (six) hours as needed for moderate pain or headache.   allopurinol (ZYLOPRIM) 100 MG tablet TAKE 1 TABLET(100 MG) BY MOUTH  DAILY   amLODipine (NORVASC) 2.5 MG tablet Take 2.5 mg by mouth daily.   anastrozole (ARIMIDEX) 1 MG tablet Take 1 tablet (1 mg total) by mouth daily.   Calcium Carb-Cholecalciferol (CALCIUM 600 + D PO) Take 1 tablet by mouth daily.   calcium carbonate (TUMS - DOSED IN MG ELEMENTAL CALCIUM) 500 MG chewable tablet Chew 1,000 mg by mouth daily as needed for indigestion or heartburn.   Carboxymethylcellul-Glycerin (LUBRICATING EYE DROPS OP) Place 1 drop into both eyes daily as needed (irritated eyes).   dapagliflozin propanediol (FARXIGA) 10 MG TABS tablet Take 1 tablet (10 mg total) by mouth daily.   escitalopram (LEXAPRO) 10 MG tablet Take 1 tablet (10 mg total) by mouth daily.   ezetimibe (ZETIA) 10 MG tablet Take 1 tablet (10 mg total) by mouth daily.   ferrous sulfate 325 (65 FE) MG tablet Take 1 tablet (325 mg total) by mouth 2 (two) times daily with a meal.   hydrochlorothiazide (MICROZIDE) 12.5 MG capsule TAKE 1 CAPSULE(12.5 MG) BY MOUTH DAILY   losartan (COZAAR) 100 MG tablet TAKE 1 TABLET(100 MG) BY MOUTH DAILY   Multiple Vitamins-Minerals (EYE VITAMINS PO) Take 1 tablet by mouth daily.    rosuvastatin (CRESTOR) 40 MG tablet TAKE 1 TABLET BY MOUTH EVERY DAY FOR CHOLESTEROL   No facility-administered encounter medications on file as of 11/10/2022.    Allergies (verified) Patient has no known allergies.   History:  Past Medical History:  Diagnosis Date   Anemia    Arthritis    Breast cancer (HCC) 2002   right breast   Breast cancer (HCC) 2011   left breast   Cancer (HCC)    Family history of breast cancer    Family history of lung cancer    Family history of non-Hodgkin's lymphoma    Family history of prostate cancer    Hypertension    Malignant neoplasm of right breast [facility])    Personal history of radiation therapy 2002, 2011   right and left breast   Pre-diabetes    Past Surgical History:  Procedure Laterality Date   ABDOMINAL HYSTERECTOMY  2018   BREAST BIOPSY Right  10/04/2018   u/s bx, vision marker, path pending   BREAST LUMPECTOMY Right 2002   breast ca with rad   BREAST LUMPECTOMY Left 2011   breast ca with rad   BREAST SURGERY  2011,2002   CATARACT EXTRACTION  2015   COLONOSCOPY     COLONOSCOPY WITH PROPOFOL N/A 06/14/2019   Procedure: COLONOSCOPY WITH PROPOFOL;  Surgeon: Toney Reil, MD;  Location: [facility] ENDOSCOPY;  Service: Gastroenterology;  Laterality: N/A;   COLONOSCOPY WITH PROPOFOL N/A 07/15/2022   Procedure: COLONOSCOPY WITH PROPOFOL;  Surgeon: Toney Reil, MD;  Location: [facility] ENDOSCOPY;  Service: Gastroenterology;  Laterality: N/A;   ESOPHAGOGASTRODUODENOSCOPY (EGD) WITH PROPOFOL N/A 06/14/2019   Procedure: ESOPHAGOGASTRODUODENOSCOPY (EGD) WITH PROPOFOL;  Surgeon: Toney Reil, MD;  Location: [facility] ENDOSCOPY;  Service: Gastroenterology;  Laterality: N/A;   EYE SURGERY     MASTECTOMY     SIMPLE MASTECTOMY WITH AXILLARY SENTINEL NODE BIOPSY Right 01/06/2020   Procedure: SIMPLE MASTECTOMY WITH AXILLARY SENTINEL NODE BIOPSY;  Surgeon: Earline Mayotte, MD;  Location: [facility] ORS;  Service: General;  Laterality: Right;   Family History  Problem Relation Age of Onset   Heart attack Mother    Cancer Father    Lung cancer Father    Breast cancer Sister 26       dx again 22   Heart attack Sister    Non-Hodgkin's lymphoma Sister 59   Heart attack Sister    Dementia Sister 73   Breast cancer Paternal Aunt    Breast cancer Maternal Aunt    Prostate cancer Paternal Grandfather        metastic, d. 74s   Colon cancer Neg Hx    Social History   Socioeconomic History   Marital status: Married    Spouse name: Eddie Candle   Number of children: 2   Years of education: Not on file   Highest education level: Not on file  Occupational History   Occupation: retired  Tobacco Use   Smoking status: Former    Current packs/day: 0.00    Average packs/day: 1 pack/day for 12.0 years (12.0 ttl pk-yrs)    Types: Cigarettes    Start  date: 03/18/1967    Quit date: 03/18/1979    Years since quitting: 43.6   Smokeless tobacco: Never   Tobacco comments:    quit 40 years ago  Vaping Use   Vaping status: Never Used  Substance and Sexual Activity   Alcohol use: Yes    Comment: rarely, 1 glass of wine monthly or less   Drug use: Never   Sexual activity: Not on file  Other Topics Concern   Not on file  Social History Narrative   From [place]      Retired, volunteers 1 day at week  at [employer]      Has 2 children, 1 son deceased 11-30-2012   Social Determinants of Health   Financial Resource Strain: Low Risk  (11/10/2022)   Overall Financial Resource Strain (CARDIA)    Difficulty of Paying Living Expenses: Not hard at all  Food Insecurity: No Food Insecurity (11/10/2022)   Hunger Vital Sign    Worried About Running Out of Food in the Last Year: Never true    Ran Out of Food in the Last Year: Never true  Transportation Needs: No Transportation Needs (11/10/2022)   PRAPARE - Administrator, Civil Service (Medical): No    Lack of Transportation (Non-Medical): No  Physical Activity: Insufficiently Active (11/10/2022)   Exercise Vital Sign    Days of Exercise per Week: 2 days    Minutes of Exercise per Session: 30 min  Stress: No Stress Concern Present (11/10/2022)   [employer] of Occupational Health - Occupational Stress Questionnaire    Feeling of Stress : Not at all  Social Connections: Moderately Integrated (11/10/2022)   Social Connection and Isolation Panel [NHANES]    Frequency of Communication with Friends and Family: Twice a week    Frequency of Social Gatherings with Friends and Family: More than three times a week    Attends Religious Services: More than 4 times per year    Active Member of [employer] or Organizations: No    Attends Engineer, structural: Never    Marital Status: Married    Tobacco Counseling Counseling given: Not Answered Tobacco comments: quit 40 years  ago   Clinical Intake:  Pre-visit preparation completed: Yes  Pain : 0-10 Pain Score: 3  Pain Type: Acute pain Pain Location: Mouth (dental work) Pain Descriptors / Indicators: Aching     BMI - recorded: 28.72 Nutritional Status: BMI 25 -29 Overweight Nutritional Risks: None Diabetes: Yes CBG done?: No Did pt. bring in CBG monitor from home?: No  How often do you need to have someone help you when you read instructions, pamphlets, or other written materials from your doctor or pharmacy?: 1 - Never  Interpreter Needed?: No  Information entered by :: Tora Kindred, CMA   Activities of Daily Living    11/10/2022    9:45 AM  In your present state of health, do you have any difficulty performing the following activities:  Hearing? 0  Vision? 0  Difficulty concentrating or making decisions? 0  Walking or climbing stairs? 0  Dressing or bathing? 0  Doing errands, shopping? 0  Preparing Food and eating ? N  Using the Toilet? N  In the past six months, have you accidently leaked urine? Y  Comment panty liner  Do you have problems with loss of bowel control? N  Managing your Medications? N  Managing your Finances? N  Housekeeping or managing your Housekeeping? N    Patient Care Team: Allegra Grana, FNP as PCP - General (Family Medicine) Scarlett Presto, RN (Inactive) as Oncology Nurse Navigator (Oncology) Carmina Miller, MD as Radiation Oncologist (Radiation Oncology)  Indicate any recent Medical Services you may have received from other than [facility] providers in the past year (date may be approximate).     Assessment:   This is a routine wellness examination for [place].  Hearing/Vision screen Hearing Screening - Comments:: Denies hearing loss Vision Screening - Comments:: Gets routine eye exams   Goals Addressed               This Visit's  Progress     Patient Stated (pt-stated)        Maintain current lifestyle and activity      Depression  Screen    11/10/2022    9:52 AM 08/22/2022    8:25 AM 03/22/2022    9:46 AM 11/03/2021    8:54 AM 10/11/2021    3:30 PM 04/21/2021    2:38 PM 10/28/2020    1:25 PM  PHQ 2/9 Scores  PHQ - 2 Score 0 0 0 0 0 0 0  PHQ- 9 Score 0     0     Fall Risk    11/10/2022    9:55 AM 08/22/2022    8:25 AM 08/22/2022    8:24 AM 03/22/2022    9:46 AM 02/21/2022    8:39 AM  Fall Risk   Falls in the past year? 0 0 0 0 0  Number falls in past yr: 0 0 0 0 0  Injury with Fall? 0 0 0 0 0  Risk for fall due to : No Fall Risks No Fall Risks No Fall Risks No Fall Risks No Fall Risks  Follow up  Falls evaluation completed Falls evaluation completed Falls evaluation completed Falls evaluation completed    MEDICARE RISK AT HOME: Medicare Risk at Home Any stairs in or around the home?: Yes If so, are there any without handrails?: No Home free of loose throw rugs in walkways, pet beds, electrical cords, etc?: Yes Adequate lighting in your home to reduce risk of falls?: Yes Life alert?: No Use of a cane, walker or w/c?: No Grab bars in the bathroom?: Yes Shower chair or bench in shower?: No Elevated toilet seat or a handicapped toilet?: No  TIMED UP AND GO:  Was the test performed?  No    Cognitive Function:        11/10/2022    9:57 AM 11/03/2021    8:58 AM 10/28/2020    1:31 PM 10/17/2018   10:20 AM  6CIT Screen  What Year? 0 points 0 points 0 points 0 points  What month? 0 points 0 points 0 points 0 points  What time? 0 points 0 points 0 points 0 points  Count back from 20 0 points 0 points 0 points 0 points  Months in reverse 0 points 0 points 0 points 0 points  Repeat phrase 0 points 0 points 0 points 0 points  Total Score 0 points 0 points 0 points 0 points    Immunizations Immunization History  Administered Date(s) Administered   Fluad Quad(high Dose 65+) 10/16/2020   Influenza-Unspecified 12/03/2019, 10/28/2021   PFIZER(Purple Top)SARS-COV-2 Vaccination 03/07/2019, 03/28/2019    Pneumococcal Conjugate-13 12/09/2019   Pneumococcal Polysaccharide-23 05/05/2021    TDAP status: Due, Education has been provided regarding the importance of this vaccine. Advised may receive this vaccine at local pharmacy or Health Dept. Aware to provide a copy of the vaccination record if obtained from local pharmacy or Health Dept. Verbalized acceptance and understanding.  Flu Vaccine status: Up to date  Pneumococcal vaccine status: Up to date  Covid-19 vaccine status: Declined, Education has been provided regarding the importance of this vaccine but patient still declined. Advised may receive this vaccine at local pharmacy or Health Dept.or vaccine clinic. Aware to provide a copy of the vaccination record if obtained from local pharmacy or Health Dept. Verbalized acceptance and understanding.  Qualifies for Shingles Vaccine? Yes   Zostavax completed No   Shingrix Completed?: No.    Education  has been provided regarding the importance of this vaccine. Patient has been advised to call insurance company to determine out of pocket expense if they have not yet received this vaccine. Advised may also receive vaccine at local pharmacy or Health Dept. Verbalized acceptance and understanding.  Screening Tests Health Maintenance  Topic Date Due   DTaP/Tdap/Td (1 - Tdap) Never done   Zoster Vaccines- Shingrix (1 of 2) Never done   COVID-19 Vaccine (3 - Pfizer risk series) 04/25/2019   INFLUENZA VACCINE  09/01/2022   HEMOGLOBIN A1C  02/17/2023   Diabetic kidney evaluation - Urine ACR  02/22/2023   FOOT EXAM  02/22/2023   OPHTHALMOLOGY EXAM  09/15/2023   Diabetic kidney evaluation - eGFR measurement  10/14/2023   MAMMOGRAM  11/07/2023   Medicare Annual Wellness (AWV)  11/10/2023   Colonoscopy  07/14/2025   Pneumonia Vaccine 10+ Years old  Completed   DEXA SCAN  Completed   HPV VACCINES  Aged Out    Health Maintenance  Health Maintenance Due  Topic Date Due   DTaP/Tdap/Td (1 - Tdap)  Never done   Zoster Vaccines- Shingrix (1 of 2) Never done   COVID-19 Vaccine (3 - Pfizer risk series) 04/25/2019   INFLUENZA VACCINE  09/01/2022    Colorectal cancer screening: Type of screening: Colonoscopy. Completed 07/15/22. Repeat every 3 years  Mammogram status: Completed 11/07/22. Repeat every year  Bone Density status: Completed 11/07/22. Results reflect: Bone density results: NORMAL. Repeat every 5 years.  Lung Cancer Screening: (Low Dose CT Chest recommended if Age 77-80 years, 20 pack-year currently smoking OR have quit w/in 15years.) does not qualify.   Lung Cancer Screening Referral: n/a  Additional Screening:  Hepatitis C Screening: does not qualify  Vision Screening: Recommended annual ophthalmology exams for early detection of glaucoma and other disorders of the eye. Is the patient up to date with their annual eye exam?  Yes  Who is the provider or what is the name of the office in which the patient attends annual eye exams?  Eye If pt is not established with a provider, would they like to be referred to a provider to establish care? No .   Dental Screening: Recommended annual dental exams for proper oral hygiene  Diabetic Foot Exam: Diabetic Foot Exam: Completed 02/21/22  Community Resource Referral / Chronic Care Management: CRR required this visit?  No   CCM required this visit?  No     Plan:     I have personally reviewed and noted the following in the patient's chart:   Medical and social history Use of alcohol, tobacco or illicit drugs  Current medications and supplements including opioid prescriptions. Patient is not currently taking opioid prescriptions. Functional ability and status Nutritional status Physical activity Advanced directives List of other physicians Hospitalizations, surgeries, and ER visits in previous 12 months Vitals Screenings to include cognitive, depression, and falls Referrals and appointments  In addition, I  have reviewed and discussed with patient certain preventive protocols, quality metrics, and best practice recommendations. A written personalized care plan for preventive services as well as general preventive health recommendations were provided to patient.     Tora Kindred, CMA   11/10/2022   After Visit Summary: (MyChart) Due to this being a telephonic visit, the after visit summary with patients personalized plan was offered to patient via MyChart   Nurse Notes:  Needs Tdap at next OV 11/22/22 Declined covid Patient states she has had shingles vaccines, will bring documentation.

## 2022-11-22 ENCOUNTER — Ambulatory Visit: Payer: Medicare Other | Admitting: Family

## 2022-11-22 ENCOUNTER — Encounter: Payer: Self-pay | Admitting: Family

## 2022-11-22 VITALS — BP 130/76 | HR 75 | Temp 97.7°F | Ht 61.0 in | Wt 154.2 lb

## 2022-11-22 DIAGNOSIS — I10 Essential (primary) hypertension: Secondary | ICD-10-CM | POA: Diagnosis not present

## 2022-11-22 DIAGNOSIS — E119 Type 2 diabetes mellitus without complications: Secondary | ICD-10-CM

## 2022-11-22 DIAGNOSIS — R7309 Other abnormal glucose: Secondary | ICD-10-CM

## 2022-11-22 LAB — POCT GLYCOSYLATED HEMOGLOBIN (HGB A1C): Hemoglobin A1C: 6.4 % — AB (ref 4.0–5.6)

## 2022-11-22 NOTE — Patient Instructions (Signed)
You are due for 2 vaccines.  You will have to have these at local pharmacy You are due for Tdap which is a tetanus vaccine including pertussis You are also due for the shingles vaccine called Shingrix.  This is a 2 dose series  Very nice to see you today!

## 2022-11-22 NOTE — Progress Notes (Unsigned)
Assessment & Plan:  There are no diagnoses linked to this encounter.   Return precautions given.   Risks, benefits, and alternatives of the medications and treatment plan prescribed today were discussed, and patient expressed understanding.   Education regarding symptom management and diagnosis given to patient on AVS either electronically or printed.  No follow-ups on file.  Rennie Plowman, FNP  Subjective:    Patient ID: Tracy Lawrence, female    DOB: October 26, 1940, 82 y.o.   MRN: [id number]  CC: Tracy Lawrence is a 82 y.o. female who presents today for follow up.   HPI: HPI Follow-up due 11/08/2022 Allergies: Patient has no known allergies. Current Outpatient Medications on File Prior to Visit  Medication Sig Dispense Refill   acetaminophen (TYLENOL) 500 MG tablet Take 1,000 mg by mouth every 6 (six) hours as needed for moderate pain or headache.     allopurinol (ZYLOPRIM) 100 MG tablet TAKE 1 TABLET(100 MG) BY MOUTH DAILY 90 tablet 3   amLODipine (NORVASC) 2.5 MG tablet Take 2.5 mg by mouth daily.     anastrozole (ARIMIDEX) 1 MG tablet Take 1 tablet (1 mg total) by mouth daily. 90 tablet 2   Calcium Carb-Cholecalciferol (CALCIUM 600 + D PO) Take 1 tablet by mouth daily.     calcium carbonate (TUMS - DOSED IN MG ELEMENTAL CALCIUM) 500 MG chewable tablet Chew 1,000 mg by mouth daily as needed for indigestion or heartburn.     Carboxymethylcellul-Glycerin (LUBRICATING EYE DROPS OP) Place 1 drop into both eyes daily as needed (irritated eyes).     dapagliflozin propanediol (FARXIGA) 10 MG TABS tablet Take 1 tablet (10 mg total) by mouth daily. 90 tablet 3   escitalopram (LEXAPRO) 10 MG tablet Take 1 tablet (10 mg total) by mouth daily. 90 tablet 3   ezetimibe (ZETIA) 10 MG tablet Take 1 tablet (10 mg total) by mouth daily. 90 tablet 3   ferrous sulfate 325 (65 FE) MG tablet Take 1 tablet (325 mg total) by mouth 2 (two) times daily with a meal. 60 tablet 0   hydrochlorothiazide  (MICROZIDE) 12.5 MG capsule TAKE 1 CAPSULE(12.5 MG) BY MOUTH DAILY 90 capsule 3   losartan (COZAAR) 100 MG tablet TAKE 1 TABLET(100 MG) BY MOUTH DAILY 90 tablet 3   Multiple Vitamins-Minerals (EYE VITAMINS PO) Take 1 tablet by mouth daily.      rosuvastatin (CRESTOR) 40 MG tablet TAKE 1 TABLET BY MOUTH EVERY DAY FOR CHOLESTEROL 90 tablet 3   No current facility-administered medications on file prior to visit.    Review of Systems    Objective:    BP 130/76   Pulse 75   Temp 97.7 F (36.5 C) (Oral)   Ht 5\' 1"  (1.549 m)   Wt 154 lb 3.2 oz (69.9 kg)   LMP  (LMP Unknown)   SpO2 99%   BMI 29.14 kg/m  BP Readings from Last 3 Encounters:  11/22/22 130/76  10/19/22 130/61  08/22/22 122/70   Wt Readings from Last 3 Encounters:  11/22/22 154 lb 3.2 oz (69.9 kg)  11/10/22 152 lb (68.9 kg)  10/19/22 154 lb 1.6 oz (69.9 kg)    Physical Exam

## 2022-11-24 NOTE — Assessment & Plan Note (Signed)
Lab Results  Component Value Date   HGBA1C 6.4 (A) 11/22/2022   A1c is well-controlled.  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  Continue losartan 100 mg for renal protection

## 2022-11-24 NOTE — Assessment & Plan Note (Signed)
Chronic, stable.  Continue  hydrochlorothiazide 12.5 mg qd, losartan 100 mg qd

## 2023-02-05 ENCOUNTER — Other Ambulatory Visit: Payer: Self-pay | Admitting: Family

## 2023-02-05 DIAGNOSIS — F32A Depression, unspecified: Secondary | ICD-10-CM

## 2023-03-19 ENCOUNTER — Other Ambulatory Visit: Payer: Self-pay | Admitting: Family

## 2023-03-19 DIAGNOSIS — E785 Hyperlipidemia, unspecified: Secondary | ICD-10-CM

## 2023-03-19 DIAGNOSIS — E119 Type 2 diabetes mellitus without complications: Secondary | ICD-10-CM

## 2023-04-01 ENCOUNTER — Other Ambulatory Visit: Payer: Self-pay | Admitting: Family

## 2023-04-01 DIAGNOSIS — E1122 Type 2 diabetes mellitus with diabetic chronic kidney disease: Secondary | ICD-10-CM

## 2023-04-01 DIAGNOSIS — I10 Essential (primary) hypertension: Secondary | ICD-10-CM

## 2023-04-01 DIAGNOSIS — E785 Hyperlipidemia, unspecified: Secondary | ICD-10-CM

## 2023-04-11 ENCOUNTER — Inpatient Hospital Stay: Payer: Medicare Other

## 2023-04-12 ENCOUNTER — Inpatient Hospital Stay: Attending: Oncology

## 2023-04-12 DIAGNOSIS — Z1732 Human epidermal growth factor receptor 2 negative status: Secondary | ICD-10-CM | POA: Diagnosis not present

## 2023-04-12 DIAGNOSIS — Z79811 Long term (current) use of aromatase inhibitors: Secondary | ICD-10-CM | POA: Diagnosis not present

## 2023-04-12 DIAGNOSIS — Z7984 Long term (current) use of oral hypoglycemic drugs: Secondary | ICD-10-CM | POA: Diagnosis not present

## 2023-04-12 DIAGNOSIS — Z170 Estrogen receptor positive status [ER+]: Secondary | ICD-10-CM | POA: Insufficient documentation

## 2023-04-12 DIAGNOSIS — Z79899 Other long term (current) drug therapy: Secondary | ICD-10-CM | POA: Insufficient documentation

## 2023-04-12 DIAGNOSIS — D509 Iron deficiency anemia, unspecified: Secondary | ICD-10-CM | POA: Insufficient documentation

## 2023-04-12 DIAGNOSIS — Z9011 Acquired absence of right breast and nipple: Secondary | ICD-10-CM | POA: Diagnosis not present

## 2023-04-12 DIAGNOSIS — E1122 Type 2 diabetes mellitus with diabetic chronic kidney disease: Secondary | ICD-10-CM | POA: Insufficient documentation

## 2023-04-12 DIAGNOSIS — I129 Hypertensive chronic kidney disease with stage 1 through stage 4 chronic kidney disease, or unspecified chronic kidney disease: Secondary | ICD-10-CM | POA: Diagnosis not present

## 2023-04-12 DIAGNOSIS — D631 Anemia in chronic kidney disease: Secondary | ICD-10-CM | POA: Diagnosis not present

## 2023-04-12 DIAGNOSIS — Z1721 Progesterone receptor positive status: Secondary | ICD-10-CM | POA: Insufficient documentation

## 2023-04-12 DIAGNOSIS — Z87891 Personal history of nicotine dependence: Secondary | ICD-10-CM | POA: Insufficient documentation

## 2023-04-12 DIAGNOSIS — C50911 Malignant neoplasm of unspecified site of right female breast: Secondary | ICD-10-CM | POA: Insufficient documentation

## 2023-04-12 DIAGNOSIS — N183 Chronic kidney disease, stage 3 unspecified: Secondary | ICD-10-CM | POA: Insufficient documentation

## 2023-04-12 LAB — CMP (CANCER CENTER ONLY)
ALT: 14 U/L (ref 0–44)
AST: 17 U/L (ref 15–41)
Albumin: 3.9 g/dL (ref 3.5–5.0)
Alkaline Phosphatase: 59 U/L (ref 38–126)
Anion gap: 7 (ref 5–15)
BUN: 40 mg/dL — ABNORMAL HIGH (ref 8–23)
CO2: 26 mmol/L (ref 22–32)
Calcium: 10.2 mg/dL (ref 8.9–10.3)
Chloride: 100 mmol/L (ref 98–111)
Creatinine: 1.5 mg/dL — ABNORMAL HIGH (ref 0.44–1.00)
GFR, Estimated: 35 mL/min — ABNORMAL LOW (ref >60)
Glucose, Bld: 109 mg/dL — ABNORMAL HIGH (ref 70–99)
Potassium: 4.6 mmol/L (ref 3.5–5.1)
Sodium: 133 mmol/L — ABNORMAL LOW (ref 135–145)
Total Bilirubin: 0.6 mg/dL (ref 0.0–1.2)
Total Protein: 7.3 g/dL (ref 6.5–8.1)

## 2023-04-12 LAB — CBC WITH DIFFERENTIAL (CANCER CENTER ONLY)
Abs Immature Granulocytes: 0.02 10*3/uL (ref 0.00–0.07)
Basophils Absolute: 0 10*3/uL (ref 0.0–0.1)
Basophils Relative: 1 %
Eosinophils Absolute: 0.1 10*3/uL (ref 0.0–0.5)
Eosinophils Relative: 2 %
HCT: 37.9 % (ref 36.0–46.0)
Hemoglobin: 11.7 g/dL — ABNORMAL LOW (ref 12.0–15.0)
Immature Granulocytes: 0 %
Lymphocytes Relative: 28 %
Lymphs Abs: 1.7 10*3/uL (ref 0.7–4.0)
MCH: 25.9 pg — ABNORMAL LOW (ref 26.0–34.0)
MCHC: 30.9 g/dL (ref 30.0–36.0)
MCV: 83.8 fL (ref 80.0–100.0)
Monocytes Absolute: 0.6 10*3/uL (ref 0.1–1.0)
Monocytes Relative: 10 %
Neutro Abs: 3.7 10*3/uL (ref 1.7–7.7)
Neutrophils Relative %: 59 %
Platelet Count: 180 10*3/uL (ref 150–400)
RBC: 4.52 MIL/uL (ref 3.87–5.11)
RDW: 14 % (ref 11.5–15.5)
WBC Count: 6.2 10*3/uL (ref 4.0–10.5)
nRBC: 0 % (ref 0.0–0.2)

## 2023-04-12 LAB — IRON AND TIBC
Iron: 89 ug/dL (ref 28–170)
Saturation Ratios: 31 % (ref 10.4–31.8)
TIBC: 291 ug/dL (ref 250–450)
UIBC: 202 ug/dL

## 2023-04-12 LAB — FERRITIN: Ferritin: 223 ng/mL (ref 11–307)

## 2023-04-13 LAB — CANCER ANTIGEN 27.29: CA 27.29: 25 U/mL (ref 0.0–38.6)

## 2023-04-18 ENCOUNTER — Inpatient Hospital Stay (HOSPITAL_BASED_OUTPATIENT_CLINIC_OR_DEPARTMENT_OTHER): Payer: Medicare Other | Admitting: Oncology

## 2023-04-18 ENCOUNTER — Encounter: Payer: Self-pay | Admitting: Oncology

## 2023-04-18 ENCOUNTER — Telehealth: Payer: Self-pay | Admitting: Oncology

## 2023-04-18 VITALS — BP 151/61 | HR 78 | Temp 97.0°F | Resp 18 | Wt 155.6 lb

## 2023-04-18 DIAGNOSIS — C50411 Malignant neoplasm of upper-outer quadrant of right female breast: Secondary | ICD-10-CM

## 2023-04-18 DIAGNOSIS — Z1231 Encounter for screening mammogram for malignant neoplasm of breast: Secondary | ICD-10-CM

## 2023-04-18 DIAGNOSIS — C50911 Malignant neoplasm of unspecified site of right female breast: Secondary | ICD-10-CM | POA: Diagnosis not present

## 2023-04-18 DIAGNOSIS — N1831 Chronic kidney disease, stage 3a: Secondary | ICD-10-CM

## 2023-04-18 DIAGNOSIS — D631 Anemia in chronic kidney disease: Secondary | ICD-10-CM

## 2023-04-18 DIAGNOSIS — N183 Chronic kidney disease, stage 3 unspecified: Secondary | ICD-10-CM

## 2023-04-18 DIAGNOSIS — Z79811 Long term (current) use of aromatase inhibitors: Secondary | ICD-10-CM | POA: Diagnosis not present

## 2023-04-18 DIAGNOSIS — E1122 Type 2 diabetes mellitus with diabetic chronic kidney disease: Secondary | ICD-10-CM

## 2023-04-18 DIAGNOSIS — Z170 Estrogen receptor positive status [ER+]: Secondary | ICD-10-CM

## 2023-04-18 MED ORDER — ANASTROZOLE 1 MG PO TABS: 1.0000 mg | ORAL_TABLET | Freq: Every day | ORAL | 1 refills | Status: DC

## 2023-04-18 MED ORDER — FERROUS SULFATE 325 (65 FE) MG PO TABS: 325.0000 mg | ORAL_TABLET | Freq: Every day | ORAL | Status: AC

## 2023-04-18 NOTE — Assessment & Plan Note (Signed)
08/27/18 SPEP ordered by nephrology was negative for M protein.   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 Encourage oral hydration and avoid nephrotoxins.

## 2023-04-18 NOTE — Assessment & Plan Note (Addendum)
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 Continue Arimidex 1 mg daily, plan 5 years, till Jan 2027 CA 27-29 remains stable.  Obtain annual unilateral left breast mammogram Oct 2025 She declined lymph edema clinic  Bone health Normal bone density in August 2022, continue calcium 1200mg  and vitamin D supplementation. Normal DEXA scheduled in Oct 2024

## 2023-04-18 NOTE — Assessment & Plan Note (Signed)
 Anemia of CKD, also alpha thalassemia trait.  Lab Results  Component Value Date   HGB 11.7 (L) 04/12/2023   TIBC 291 04/12/2023   IRONPCTSAT 31 04/12/2023   FERRITIN 223 04/12/2023     Continue oral ferrous sulfate 325mg  BID.

## 2023-04-18 NOTE — Progress Notes (Signed)
 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 Continue Arimidex 1 mg daily, plan 5 years, till Jan 2027 CA 27-29 remains stable.  Obtain annual unilateral left breast mammogram Oct 2025 She declined lymph edema clinic  Bone health Normal bone density in August 2022, continue calcium 1200mg  and vitamin D supplementation. 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  Lab Results  Component Value Date   HGB 11.7 (L) 04/12/2023   TIBC 291 04/12/2023   IRONPCTSAT 31 04/12/2023   FERRITIN 223 04/12/2023     Continue oral ferrous sulfate 325mg  BID.   CKD stage 3 due to type 2 diabetes mellitus (HCC) 08/27/18 SPEP ordered by nephrology was negative for M protein.   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 Encourage oral hydration and avoid nephrotoxins.     Orders Placed This Encounter  Procedures   MM 3D SCREENING MAMMOGRAM UNILATERAL LEFT BREAST    Standing Status:   Future    Expected Date:   11/06/2023    Expiration Date:   04/17/2024    Reason for Exam (SYMPTOM  OR DIAGNOSIS REQUIRED):   Breast cancer    Preferred imaging location?:   [facility]   CBC with Differential (Cancer Center Only)    Standing Status:   Future    Expected Date:   11/06/2023    Expiration Date:   04/17/2024   CMP (Cancer Center only)    Standing Status:   Future    Expected Date:   11/06/2023    Expiration Date:   04/17/2024   Cancer antigen 27.29    Standing Status:   Future    Expected Date:   11/06/2023    Expiration Date:   04/17/2024   Ambulatory referral to [facility] Screening    Referral Priority:    Routine    Referral Type:   Consultation    Referral Reason:   Specialty Services Required    Number of Visits Requested:   1    Return of visit:  Oct 2025  All questions were answered. The patient knows to call the clinic with any problems, questions or concerns.  Rickard Patience, MD, PhD [facility] Hematology Oncology 04/18/2023   PERTINENT ONCOLOGY HISTORY Tracy Lawrence is a  83 y.o.  female with PMH listed below was seen in consultation at the request of  Allegra Grana, FNP  for evaluation of abnormal SPEP. Reviewed patient's previous blood work via care everywhere. 08/27/2018, free light chain ratio 1.97, free kappa 79.5, free lambda 40.3. Protein electrophoresis showed increased alpha globulin.  #Patient moved from [place] to [place] last year. She reports history of right breast cancer in 2002 and left breast cancer in 2011.  She underwent right breast lumpectomy followed by adjuvant radiation followed by 5 years of tamoxifen.  Underwent left lumpectomy followed by adjuvant radiation followed by aromatase inhibitor for 5 years.  Currently she is not on any antiestrogen treatment.  Denies any chemotherapy treatment history.  Both breast cancer treatments were done in [place]. Obtained records from [facility] were reviewed.  No pathology is available. 06/06/2017  screening mammogram showed postoperative changes in both breasts.  Architectural distortion has not changed significantly.  Skin thickening is similar.  Calcifications have increased mildly around the postoperative sites bilaterally.  No new dominant mass.   # Chronic history of kidney insufficiency, stage IIIb.  Patient sees Dr. Cherylann Ratel.  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  # Breast exam was performed in seated and lying down position. Right breast lower inner quadrant lumpectomy scar with focal scar tissue. Left breast  upper outer quadrant, 1:00 lumpectomy scar with palpable round thickened breast tissue. No palpable axillary lymphadenopathy.  # #Family history of breast cancer in first-degree relatives, patient reports that she was previously tested negative for BRCA gene mutation.  Results were not available. Patient was referred to genetic counselor and 11/19/2019 [facility] Breast Cancer STAT Panel + Multi- cancer panel found no pathogenic mutations.   # 09/17/2019 bilateral screening mammogram showed right breast mass which warrants further evaluation.  No suspicious findings in left breast. 09/25/2019, right diagnostic breast mammogram showed specialist right breast mass at 10:00. Right axilla is negative for adenopathy.  Patient underwent ultrasound-guided core biopsy of the right breast 10:00 location. 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  Grade 2, no DCIS or LVI.  ER 90%, PR 11-50%, HER-2 negative.  Chronic microcytic anemia, iron panel is not consistent with iron deficiency. Normal hemoglobin evaluation.  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 Hemoglobin stable at 11.6. GI work-up includes EGD and colonoscopy on 06/14/2019.  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  Benign colon polyps.  No high-grade dysplasia or malignancy.  She follows up with Dr. Allegra Lai Anemia can be also secondary to CKD.  # #01/06/2020 patient underwent right mastectomy and sentinel lymph node biopsy.  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  No adjuvant chemotherapy will be offered She had mastectomy with negative margin, no need for adjuvant radiation. Baseline elevated CA 27.29--> post operation normalization of CEA 27.29 February 2020, started on Arimidex She was seen by Dr. Lemar Livings 04/02/2020 for  seroma.  #INTERVAL HISTORY Tracy Lawrence is a 83 y.o. female who has above history reviewed by me today presents for follow up visit for breast cancer.  Patient has no new complaints.  She has manageable side effects from Arimidex. She reports intermittent right axillary discomfort.  + chronic Joint pain.    Review of Systems  Constitutional:  Negative for appetite change, chills, fatigue and fever.  HENT:   Negative for hearing loss and voice change.   Eyes:  Negative for eye problems.  Respiratory:  Negative for chest tightness and cough.   Cardiovascular:  Negative for chest pain.  Gastrointestinal:  Negative for abdominal distention, abdominal pain and blood in stool.  Endocrine: Positive for hot flashes.  Genitourinary:  Negative for difficulty urinating and frequency.   Musculoskeletal:  Positive for arthralgias.  Skin:  Negative for itching and rash.  Neurological:  Negative for extremity weakness.  Hematological:  Negative for adenopathy.  Psychiatric/Behavioral:  Negative for confusion.    Right mastectomy MEDICAL HISTORY:  Past Medical History:  Diagnosis Date   Anemia    Arthritis    Breast cancer (HCC) 2002   right breast   Breast cancer (HCC) 2011   left breast  Cancer [facility])    Family history of breast cancer    Family history of lung cancer    Family history of non-Hodgkin's lymphoma    Family history of prostate cancer    Hypertension    Malignant neoplasm of right breast [facility])    Personal history of radiation therapy 05/12/2000, 2009-05-12   right and left breast   Pre-diabetes     SURGICAL HISTORY: Past Surgical History:  Procedure Laterality Date   ABDOMINAL HYSTERECTOMY  05-12-16   BREAST BIOPSY Right 10/04/2018   u/s bx, vision marker, path pending   BREAST LUMPECTOMY Right 05/12/2000   breast ca with rad   BREAST LUMPECTOMY Left May 12, 2009   breast ca with rad   BREAST SURGERY  May 12, 2009   CATARACT EXTRACTION  2015   COLONOSCOPY     COLONOSCOPY WITH PROPOFOL  N/A 06/14/2019   Procedure: COLONOSCOPY WITH PROPOFOL;  Surgeon: Toney Reil, MD;  Location: [facility] ENDOSCOPY;  Service: Gastroenterology;  Laterality: N/A;   COLONOSCOPY WITH PROPOFOL N/A 07/15/2022   Procedure: COLONOSCOPY WITH PROPOFOL;  Surgeon: Toney Reil, MD;  Location: [facility] ENDOSCOPY;  Service: Gastroenterology;  Laterality: N/A;   ESOPHAGOGASTRODUODENOSCOPY (EGD) WITH PROPOFOL N/A 06/14/2019   Procedure: ESOPHAGOGASTRODUODENOSCOPY (EGD) WITH PROPOFOL;  Surgeon: Toney Reil, MD;  Location: [facility] ENDOSCOPY;  Service: Gastroenterology;  Laterality: N/A;   EYE SURGERY     MASTECTOMY     SIMPLE MASTECTOMY WITH AXILLARY SENTINEL NODE BIOPSY Right 01/06/2020   Procedure: SIMPLE MASTECTOMY WITH AXILLARY SENTINEL NODE BIOPSY;  Surgeon: Earline Mayotte, MD;  Location: [facility] ORS;  Service: General;  Laterality: Right;    SOCIAL HISTORY: Social History   Socioeconomic History   Marital status: Married    Spouse name: Eddie Candle   Number of children: 2   Years of education: Not on file   Highest education level: Not on file  Occupational History   Occupation: retired  Tobacco Use   Smoking status: Former    Current packs/day: 0.00    Average packs/day: 1 pack/day for 12.0 years (12.0 ttl pk-yrs)    Types: Cigarettes    Start date: 03/18/1967    Quit date: 03/18/1979    Years since quitting: 44.1   Smokeless tobacco: Never   Tobacco comments:    quit 40 years ago  Vaping Use   Vaping status: Never Used  Substance and Sexual Activity   Alcohol use: Yes    Comment: rarely, 1 glass of wine monthly or less   Drug use: Never   Sexual activity: Not on file  Other Topics Concern   Not on file  Social History Narrative   From [place]      Retired, volunteers 1 day at week at [employer]      Has 2 children, 1 son deceased May 12, 2012   Social Drivers of Corporate investment banker Strain: Low Risk  (11/10/2022)   Overall Financial Resource Strain (CARDIA)    Difficulty of  Paying Living Expenses: Not hard at all  Food Insecurity: No Food Insecurity (11/10/2022)   Hunger Vital Sign    Worried About Running Out of Food in the Last Year: Never true    Ran Out of Food in the Last Year: Never true  Transportation Needs: No Transportation Needs (11/10/2022)   PRAPARE - Administrator, Civil Service (Medical): No    Lack of Transportation (Non-Medical): No  Physical Activity: Insufficiently Active (11/10/2022)   Exercise Vital Sign    Days of  Exercise per Week: 2 days    Minutes of Exercise per Session: 30 min  Stress: No Stress Concern Present (11/10/2022)   [employer] of Occupational Health - Occupational Stress Questionnaire    Feeling of Stress : Not at all  Social Connections: Moderately Integrated (11/10/2022)   Social Connection and Isolation Panel [NHANES]    Frequency of Communication with Friends and Family: Twice a week    Frequency of Social Gatherings with Friends and Family: More than three times a week    Attends Religious Services: More than 4 times per year    Active Member of [employer] or Organizations: No    Attends Banker Meetings: Never    Marital Status: Married  Catering manager Violence: Not At Risk (11/10/2022)   Humiliation, Afraid, Rape, and Kick questionnaire    Fear of Current or Ex-Partner: No    Emotionally Abused: No    Physically Abused: No    Sexually Abused: No    FAMILY HISTORY: Family History  Problem Relation Age of Onset   Heart attack Mother    Cancer Father    Lung cancer Father    Breast cancer Sister 92       dx again 37   Heart attack Sister    Non-Hodgkin's lymphoma Sister 33   Heart attack Sister    Dementia Sister 56   Breast cancer Paternal Aunt    Breast cancer Maternal Aunt    Prostate cancer Paternal Grandfather        metastic, d. 44s   Colon cancer Neg Hx     ALLERGIES:  has no known allergies.  MEDICATIONS:  Current Outpatient Medications  Medication Sig  Dispense Refill   acetaminophen (TYLENOL) 500 MG tablet Take 1,000 mg by mouth every 6 (six) hours as needed for moderate pain or headache.     allopurinol (ZYLOPRIM) 100 MG tablet TAKE 1 TABLET(100 MG) BY MOUTH DAILY 90 tablet 3   amLODipine (NORVASC) 2.5 MG tablet Take 2.5 mg by mouth daily.     Calcium Carb-Cholecalciferol (CALCIUM 600 + D PO) Take 1 tablet by mouth daily.     calcium carbonate (TUMS - DOSED IN MG ELEMENTAL CALCIUM) 500 MG chewable tablet Chew 1,000 mg by mouth daily as needed for indigestion or heartburn.     Carboxymethylcellul-Glycerin (LUBRICATING EYE DROPS OP) Place 1 drop into both eyes daily as needed (irritated eyes).     dapagliflozin propanediol (FARXIGA) 10 MG TABS tablet Take 1 tablet (10 mg total) by mouth daily. 90 tablet 3   escitalopram (LEXAPRO) 10 MG tablet TAKE 1 TABLET(10 MG) BY MOUTH DAILY 90 tablet 3   ezetimibe (ZETIA) 10 MG tablet TAKE 1 TABLET(10 MG) BY MOUTH DAILY 90 tablet 1   hydrochlorothiazide (MICROZIDE) 12.5 MG capsule TAKE 1 CAPSULE(12.5 MG) BY MOUTH DAILY 90 capsule 3   losartan (COZAAR) 100 MG tablet TAKE 1 TABLET(100 MG) BY MOUTH DAILY 90 tablet 3   Multiple Vitamins-Minerals (EYE VITAMINS PO) Take 1 tablet by mouth daily.      rosuvastatin (CRESTOR) 40 MG tablet TAKE 1 TABLET BY MOUTH EVERY DAY FOR CHOLESTEROL 90 tablet 3   anastrozole (ARIMIDEX) 1 MG tablet Take 1 tablet (1 mg total) by mouth daily. 90 tablet 1   ferrous sulfate 325 (65 FE) MG tablet Take 1 tablet (325 mg total) by mouth daily.     No current facility-administered medications for this visit.     PHYSICAL EXAMINATION: ECOG PERFORMANCE STATUS: 0 - Asymptomatic  Vitals:   04/18/23 1031  BP: (!) 151/61  Pulse: 78  Resp: 18  Temp: (!) 97 F (36.1 C)  SpO2: 99%   Filed Weights   04/18/23 1031  Weight: 155 lb 9.6 oz (70.6 kg)    Physical Exam Constitutional:      General: She is not in acute distress. HENT:     Head: Normocephalic and atraumatic.  Eyes:      General: No scleral icterus. Cardiovascular:     Rate and Rhythm: Normal rate and regular rhythm.     Heart sounds: Normal heart sounds.  Pulmonary:     Effort: Pulmonary effort is normal. No respiratory distress.     Breath sounds: No wheezing.  Abdominal:     General: Bowel sounds are normal. There is no distension.     Palpations: Abdomen is soft.  Musculoskeletal:        General: No deformity. Normal range of motion.     Cervical back: Normal range of motion and neck supple.  Skin:    General: Skin is warm and dry.     Findings: No erythema or rash.  Neurological:     Mental Status: She is alert and oriented to person, place, and time. Mental status is at baseline.     Cranial Nerves: No cranial nerve deficit.     Coordination: Coordination normal.  Psychiatric:        Mood and Affect: Mood normal.   Breast exam was performed in seated and lying down position. Right breast mastectomy.  Mild edema of right axillary  Left breast upper outer quadrant, 1:00 lumpectomy scar with palpable round thickened breast tissue.   LABORATORY DATA:  I have reviewed the data as listed     Latest Ref Rng & Units 04/12/2023   10:31 AM 10/14/2022   11:07 AM 03/11/2022   12:46 PM  CBC  WBC 4.0 - 10.5 K/uL 6.2  6.5  5.9   Hemoglobin 12.0 - 15.0 g/dL 03.4  74.2  59.5   Hematocrit 36.0 - 46.0 % 37.9  36.9  35.2   Platelets 150 - 400 K/uL 180  192  219       Latest Ref Rng & Units 04/12/2023   10:31 AM 10/14/2022   11:07 AM 03/11/2022   12:46 PM  CMP  Glucose 70 - 99 mg/dL 638  756  433   BUN 8 - 23 mg/dL 40  41  25   Creatinine 0.44 - 1.00 mg/dL 2.95  1.88  4.16   Sodium 135 - 145 mmol/L 133  129  130   Potassium 3.5 - 5.1 mmol/L 4.6  4.1  3.6   Chloride 98 - 111 mmol/L 100  97  97   CO2 22 - 32 mmol/L 26  23  24    Calcium 8.9 - 10.3 mg/dL 60.6  9.9  9.4   Total Protein 6.5 - 8.1 g/dL 7.3  7.3  7.7   Total Bilirubin 0.0 - 1.2 mg/dL 0.6  0.5  0.5   Alkaline Phos 38 - 126 U/L 59  54  63    AST 15 - 41 U/L 17  18  20    ALT 0 - 44 U/L 14  17  14      Iron/TIBC/Ferritin/ %Sat    Component Value Date/Time   IRON 89 04/12/2023 1031   TIBC 291 04/12/2023 1031   FERRITIN 223 04/12/2023 1031   IRONPCTSAT 31 04/12/2023 1031      RADIOGRAPHIC STUDIES:  I have personally reviewed the radiological images as listed and agreed with the findings in the report. No results found.

## 2023-04-18 NOTE — Telephone Encounter (Signed)
 Per the LOS 04/18/23 "Refer to lymph edema clinic"  I asked the pt when she wanted to schedule this appt and she stated she did not think this was necessary and does not want to schedule this appt.

## 2023-05-24 ENCOUNTER — Ambulatory Visit (INDEPENDENT_AMBULATORY_CARE_PROVIDER_SITE_OTHER): Payer: Medicare Other | Admitting: Family

## 2023-05-24 ENCOUNTER — Encounter: Payer: Self-pay | Admitting: Family

## 2023-05-24 VITALS — BP 130/70 | HR 75 | Temp 97.2°F | Ht 61.0 in | Wt 152.6 lb

## 2023-05-24 DIAGNOSIS — I10 Essential (primary) hypertension: Secondary | ICD-10-CM

## 2023-05-24 DIAGNOSIS — R7309 Other abnormal glucose: Secondary | ICD-10-CM | POA: Diagnosis not present

## 2023-05-24 DIAGNOSIS — E119 Type 2 diabetes mellitus without complications: Secondary | ICD-10-CM | POA: Diagnosis not present

## 2023-05-24 LAB — POCT GLYCOSYLATED HEMOGLOBIN (HGB A1C): Hemoglobin A1C: 6.3 % — AB (ref 4.0–5.6)

## 2023-05-24 NOTE — Assessment & Plan Note (Signed)
  A1c is well-controlled.  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  Continue losartan  100 mg for renal protection

## 2023-05-24 NOTE — Assessment & Plan Note (Signed)
 Chronic, stable.  Continue  hydrochlorothiazide  12.5 mg qd, losartan  100 mg qd Of note: Old prescription of amlodipine on medication list.  Upon further discussion with patient , we do not think that she is taking this.  Printed out AVS and asked her to do extensive medication review to see if she is still taking amlodipine.  Patient will let me know

## 2023-05-24 NOTE — Patient Instructions (Signed)
Please review medication list.

## 2023-05-24 NOTE — Progress Notes (Signed)
 Assessment & Plan:  Elevated glucose -     POCT glycosylated hemoglobin (Hb A1C)  Essential hypertension Assessment & Plan: Chronic, stable.  Continue  hydrochlorothiazide  12.5 mg qd, losartan  100 mg qd Of note: Old prescription of amlodipine on medication list.  Upon further discussion with patient , we do not think that she is taking this.  Printed out AVS and asked her to do extensive medication review to see if she is still taking amlodipine.  Patient will let me know   Diabetes mellitus without complication (HCC) Assessment & Plan:  A1c is well-controlled.  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  Continue losartan  100 mg for renal protection      Return precautions given.   Risks, benefits, and alternatives of the medications and treatment plan prescribed today were discussed, and patient expressed understanding.   Education regarding symptom management and diagnosis given to patient on AVS either electronically or printed.  Return in about 3 months (around 08/23/2023).  [name], FNP  Subjective:    Patient ID: Tracy Lawrence, female    DOB: 1940-10-02, 83 y.o.   MRN: [id number]  CC: Tracy Lawrence is a 83 y.o. female who presents today for follow up.   HPI: Accompanied by husband Feels well today No new complaints  Compliant with losartan , hydrochlorothiazide .  She is not sure if she is on amlodipine  Denies CP, sob        Follow-up Dr. Wilhelmenia Harada 04/18/2023.  Continue on Arimidex  until 2027.  Left breast mammogram due October 2025 Allergies: Patient has no known allergies. Current Outpatient Medications on File Prior to Visit  Medication Sig Dispense Refill   acetaminophen  (TYLENOL ) 500 MG tablet Take 1,000 mg by mouth every 6 (six) hours as needed for moderate pain or headache.     allopurinol  (ZYLOPRIM ) 100 MG tablet TAKE 1 TABLET(100 MG) BY MOUTH DAILY 90 tablet 3   anastrozole  (ARIMIDEX ) 1 MG tablet Take 1 tablet (1 mg  total) by mouth daily. 90 tablet 1   Calcium  Carb-Cholecalciferol (CALCIUM  600 + D PO) Take 1 tablet by mouth daily.     calcium  carbonate (TUMS - DOSED IN MG ELEMENTAL CALCIUM ) 500 MG chewable tablet Chew 1,000 mg by mouth daily as needed for indigestion or heartburn.     Carboxymethylcellul-Glycerin (LUBRICATING EYE DROPS OP) Place 1 drop into both eyes daily as needed (irritated eyes).     dapagliflozin  propanediol (FARXIGA ) 10 MG TABS tablet Take 1 tablet (10 mg total) by mouth daily. 90 tablet 3   escitalopram  (LEXAPRO ) 10 MG tablet TAKE 1 TABLET(10 MG) BY MOUTH DAILY 90 tablet 3   ezetimibe  (ZETIA ) 10 MG tablet TAKE 1 TABLET(10 MG) BY MOUTH DAILY 90 tablet 1   ferrous sulfate  325 (65 FE) MG tablet Take 1 tablet (325 mg total) by mouth daily.     hydrochlorothiazide  (MICROZIDE ) 12.5 MG capsule TAKE 1 CAPSULE(12.5 MG) BY MOUTH DAILY 90 capsule 3   losartan  (COZAAR ) 100 MG tablet TAKE 1 TABLET(100 MG) BY MOUTH DAILY 90 tablet 3   Multiple Vitamins-Minerals (EYE VITAMINS PO) Take 1 tablet by mouth daily.      rosuvastatin  (CRESTOR ) 40 MG tablet TAKE 1 TABLET BY MOUTH EVERY DAY FOR CHOLESTEROL 90 tablet 3   No current facility-administered medications on file prior to visit.    Review of Systems  Constitutional:  Negative for chills and fever.  Respiratory:  Negative for cough.   Cardiovascular:  Negative for chest pain  and palpitations.  Gastrointestinal:  Negative for nausea and vomiting.      Objective:    BP 130/70   Pulse 75   Temp (!) 97.2 F (36.2 C) (Oral)   Ht 5\' 1"  (1.549 m)   Wt 152 lb 9.6 oz (69.2 kg)   LMP  (LMP Unknown)   SpO2 97%   BMI 28.83 kg/m  BP Readings from Last 3 Encounters:  05/24/23 130/70  04/18/23 (!) 151/61  11/22/22 130/76   Wt Readings from Last 3 Encounters:  05/24/23 152 lb 9.6 oz (69.2 kg)  04/18/23 155 lb 9.6 oz (70.6 kg)  11/22/22 154 lb 3.2 oz (69.9 kg)    Physical Exam Vitals reviewed.  Constitutional:      Appearance: She is  well-developed.  Eyes:     Conjunctiva/sclera: Conjunctivae normal.  Cardiovascular:     Rate and Rhythm: Normal rate and regular rhythm.     Pulses: Normal pulses.     Heart sounds: Normal heart sounds.  Pulmonary:     Effort: Pulmonary effort is normal.     Breath sounds: Normal breath sounds. No wheezing, rhonchi or rales.  Skin:    General: Skin is warm and dry.  Neurological:     Mental Status: She is alert.  Psychiatric:        Speech: Speech normal.        Behavior: Behavior normal.        Thought Content: Thought content normal.

## 2023-05-31 ENCOUNTER — Telehealth: Payer: Self-pay

## 2023-05-31 ENCOUNTER — Other Ambulatory Visit: Payer: Self-pay | Admitting: Pharmacist

## 2023-05-31 DIAGNOSIS — E119 Type 2 diabetes mellitus without complications: Secondary | ICD-10-CM

## 2023-05-31 NOTE — Addendum Note (Signed)
 Addended by: Daron Ellen on: 05/31/2023 03:31 PM   Modules accepted: Orders

## 2023-05-31 NOTE — Progress Notes (Signed)
   05/31/2023  Patient ID: Tracy Lawrence, female   DOB: [birth year], 83 y.o.   MRN: [id number]  Missed call/voicemail from patient stating she is down to 4 tablets of Farxiga  10mg  that she receives through [employer] PAP.  Forwarding to clinical pharmacist for [facility] and medication assistance team to assist.    Linn Rich, PharmD, DPLA

## 2023-05-31 NOTE — Progress Notes (Addendum)
 Patient called stating she is down to #4 tablets of Farxiga .   PAP spreadsheet suggests she has been re-enrolled for [employer] through 2025.  Will follow up with PAP team as to confirm re-enrollment status. Cc'ing Clinical pool to see if we have sample available of Farxiga  for patient while she awaits her shipment.   eRx pended to PCP: [facility] ([facility])

## 2023-06-01 ENCOUNTER — Telehealth: Payer: Self-pay

## 2023-06-01 MED ORDER — DAPAGLIFLOZIN PROPANEDIOL 10 MG PO TABS: 10.0000 mg | ORAL_TABLET | Freq: Every day | ORAL | 3 refills | Status: DC

## 2023-06-01 NOTE — Telephone Encounter (Addendum)
 Spoke to pt and informed her that we have samples of Farxiga  10 mg tablets in office and we can provide 2 weeks worth if she runs out before receiving her pt assistance. Placed upfront in cabinet for pt pickup

## 2023-06-01 NOTE — Progress Notes (Signed)
 LVM to inform pt that we do have 10 mg Farxiga  samples in office that we can give pt until she receives the pt assistance medication, put call through to speak with Pierre Dellarocco

## 2023-06-01 NOTE — Telephone Encounter (Signed)
 ERROR

## 2023-06-01 NOTE — Telephone Encounter (Signed)
 Copied from CRM [id number]. Topic: General - Call Back - No Documentation >> Jun 01, 2023 12:04 PM Tracy Lawrence wrote: Reason for CRM: Patient returning call from [facility], called [facility] and was advised not available and to send a message requesting a call back for the patient.

## 2023-06-01 NOTE — Progress Notes (Signed)
   06/01/2023  Patient ID: Tracy Lawrence, female   DOB: 04-14-1940, 83 y.o.   MRN: [id number]  Missed call/voicemail from patient in regard to running low on Farxiga .  Per chart notes, new order was sent to [facility] yesterday, and the office has set aside a sample of the medication for the patient.  Attempted to call patient to inform but had to leave a message.  New chart note reflects patient spoke with someone in PCP office today that informed her sample is ready for pick up.  Linn Rich, PharmD, DPLA

## 2023-06-01 NOTE — Progress Notes (Signed)
 Pt has been notified see telephone note

## 2023-06-01 NOTE — Addendum Note (Signed)
 Addended by: Calista Catching on: 06/01/2023 06:37 AM   Modules accepted: Orders

## 2023-06-04 ENCOUNTER — Telehealth: Payer: Self-pay | Admitting: Family

## 2023-06-04 NOTE — Telephone Encounter (Signed)
 Reviewed chart Urine micro obtained by nephrology 03/13/23 No repeat urine at this time

## 2023-07-13 LAB — MICROALBUMIN, URINE: Microalb, Ur: 1

## 2023-07-13 LAB — PROTEIN / CREATININE RATIO, URINE: Creatinine, Urine: 46

## 2023-07-13 LAB — MICROALBUMIN / CREATININE URINE RATIO: Microalb Creat Ratio: 22

## 2023-07-29 ENCOUNTER — Other Ambulatory Visit: Payer: Self-pay | Admitting: Family

## 2023-07-29 DIAGNOSIS — I10 Essential (primary) hypertension: Secondary | ICD-10-CM

## 2023-08-02 ENCOUNTER — Other Ambulatory Visit: Payer: Self-pay

## 2023-08-02 DIAGNOSIS — E119 Type 2 diabetes mellitus without complications: Secondary | ICD-10-CM

## 2023-08-23 ENCOUNTER — Ambulatory Visit: Admitting: Family

## 2023-09-04 ENCOUNTER — Ambulatory Visit (INDEPENDENT_AMBULATORY_CARE_PROVIDER_SITE_OTHER): Admitting: Family

## 2023-09-04 ENCOUNTER — Ambulatory Visit: Payer: Self-pay | Admitting: Family

## 2023-09-04 ENCOUNTER — Encounter: Payer: Self-pay | Admitting: Family

## 2023-09-04 VITALS — BP 136/68 | HR 76 | Temp 97.4°F | Resp 20 | Ht 61.0 in | Wt 148.5 lb

## 2023-09-04 DIAGNOSIS — N183 Chronic kidney disease, stage 3 unspecified: Secondary | ICD-10-CM

## 2023-09-04 DIAGNOSIS — N189 Chronic kidney disease, unspecified: Secondary | ICD-10-CM

## 2023-09-04 DIAGNOSIS — E1122 Type 2 diabetes mellitus with diabetic chronic kidney disease: Secondary | ICD-10-CM | POA: Diagnosis not present

## 2023-09-04 DIAGNOSIS — Z7984 Long term (current) use of oral hypoglycemic drugs: Secondary | ICD-10-CM

## 2023-09-04 DIAGNOSIS — E119 Type 2 diabetes mellitus without complications: Secondary | ICD-10-CM

## 2023-09-04 LAB — LIPID PANEL
Cholesterol: 121 mg/dL (ref 0–200)
HDL: 47.7 mg/dL (ref >39.00)
LDL Cholesterol: 59 mg/dL (ref 0–99)
NonHDL: 73.14
Total CHOL/HDL Ratio: 3
Triglycerides: 73 mg/dL (ref 0.0–149.0)
VLDL: 14.6 mg/dL (ref 0.0–40.0)

## 2023-09-04 LAB — HEMOGLOBIN A1C: Hgb A1c MFr Bld: 6.5 % (ref 4.6–6.5)

## 2023-09-04 NOTE — Assessment & Plan Note (Signed)
 Chronic kidney disease   CKD is stable with no rapid decline. Creatinine levels are stable.Continue Farxiga  10 mg daily and losartan  100mg . Advise hydration and avoidance of nephrotoxic medications such as NSAIDs

## 2023-09-04 NOTE — Progress Notes (Signed)
 Assessment & Plan:  Chronic kidney disease, unspecified CKD stage  Diabetes mellitus without complication (HCC) Assessment & Plan:  A1c is well-controlled.  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  Continue losartan  100 mg for renal protection  Orders: -     Lipid panel -     Hemoglobin A1c  CKD stage 3 due to type 2 diabetes mellitus (HCC) Assessment & Plan: Chronic kidney disease   CKD is stable with no rapid decline. Creatinine levels are stable.Continue Farxiga  10 mg daily and losartan  100mg . Advise hydration and avoidance of nephrotoxic medications such as NSAIDs      Return precautions given.   Risks, benefits, and alternatives of the medications and treatment plan prescribed today were discussed, and patient expressed understanding.   Education regarding symptom management and diagnosis given to patient on AVS either electronically or printed.  Return in about 3 months (around 12/05/2023).  Tracy Northern, FNP  Subjective:    Patient ID: Tracy Lawrence, female    DOB: 1940/04/07, 83 y.o.   MRN: [id number]  CC: Tracy Lawrence is a 83 y.o. female who presents today for follow up.   HPI: Accompanied by her husband.    Discussed the use of AI scribe software for clinical note transcription with the patient, who gave verbal consent to proceed.  History of Present Illness   Tracy Lawrence is an 83 year old female with chronic kidney disease who presents for a follow-up visit.  She has no new concerns.  Her current medications include Farxiga  10 mg and losartan . She is not taking amlodipine and is currently on hydrochlorothiazide . Her blood pressure readings at home are typically in the low 130s.  She does not take ibuprofen and uses Tylenol  instead when needed.  follow-up nephrology 07/13/2023.  No Medication changes.  Continued losartan , Farxiga         Allergies: Patient has no known allergies. Current Outpatient  Medications on File Prior to Visit  Medication Sig Dispense Refill   acetaminophen  (TYLENOL ) 500 MG tablet Take 1,000 mg by mouth every 6 (six) hours as needed for moderate pain or headache.     allopurinol  (ZYLOPRIM ) 100 MG tablet TAKE 1 TABLET(100 MG) BY MOUTH DAILY 90 tablet 3   anastrozole  (ARIMIDEX ) 1 MG tablet Take 1 tablet (1 mg total) by mouth daily. 90 tablet 1   Calcium  Carb-Cholecalciferol (CALCIUM  600 + D PO) Take 1 tablet by mouth daily.     calcium  carbonate (TUMS - DOSED IN MG ELEMENTAL CALCIUM ) 500 MG chewable tablet Chew 1,000 mg by mouth daily as needed for indigestion or heartburn.     Carboxymethylcellul-Glycerin (LUBRICATING EYE DROPS OP) Place 1 drop into both eyes daily as needed (irritated eyes).     dapagliflozin  propanediol (FARXIGA ) 10 MG TABS tablet Take 1 tablet (10 mg total) by mouth daily. 90 tablet 3   escitalopram  (LEXAPRO ) 10 MG tablet TAKE 1 TABLET(10 MG) BY MOUTH DAILY 90 tablet 3   ezetimibe  (ZETIA ) 10 MG tablet TAKE 1 TABLET(10 MG) BY MOUTH DAILY 90 tablet 1   ferrous sulfate  325 (65 FE) MG tablet Take 1 tablet (325 mg total) by mouth daily.     hydrochlorothiazide  (MICROZIDE ) 12.5 MG capsule TAKE 1 CAPSULE(12.5 MG) BY MOUTH DAILY 90 capsule 3   losartan  (COZAAR ) 100 MG tablet TAKE 1 TABLET(100 MG) BY MOUTH DAILY 90 tablet 3   Multiple Vitamins-Minerals (EYE VITAMINS PO) Take 1 tablet by mouth daily.  rosuvastatin  (CRESTOR ) 40 MG tablet TAKE 1 TABLET BY MOUTH EVERY DAY FOR CHOLESTEROL 90 tablet 3   No current facility-administered medications on file prior to visit.    Review of Systems  Constitutional:  Negative for chills and fever.  Respiratory:  Negative for cough.   Cardiovascular:  Negative for chest pain and palpitations.  Gastrointestinal:  Negative for nausea and vomiting.      Objective:    BP 136/68   Pulse 76   Temp (!) 97.4 F (36.3 C)   Resp 20   Ht 5' 1 (1.549 m)   Wt 148 lb 8 oz (67.4 kg)   LMP  (LMP Unknown)   SpO2 97%    BMI 28.06 kg/m  BP Readings from Last 3 Encounters:  09/04/23 136/68  05/24/23 130/70  04/18/23 (!) 151/61   Wt Readings from Last 3 Encounters:  09/04/23 148 lb 8 oz (67.4 kg)  05/24/23 152 lb 9.6 oz (69.2 kg)  04/18/23 155 lb 9.6 oz (70.6 kg)    Physical Exam Vitals reviewed.  Constitutional:      Appearance: She is well-developed.  Eyes:     Conjunctiva/sclera: Conjunctivae normal.  Cardiovascular:     Rate and Rhythm: Normal rate and regular rhythm.     Pulses: Normal pulses.     Heart sounds: Normal heart sounds.  Pulmonary:     Effort: Pulmonary effort is normal.     Breath sounds: Normal breath sounds. No wheezing, rhonchi or rales.  Skin:    General: Skin is warm and dry.  Neurological:     Mental Status: She is alert.  Psychiatric:        Speech: Speech normal.        Behavior: Behavior normal.        Thought Content: Thought content normal.

## 2023-09-04 NOTE — Assessment & Plan Note (Signed)
  A1c is well-controlled.  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  Continue losartan  100 mg for renal protection

## 2023-11-08 ENCOUNTER — Ambulatory Visit
Admission: RE | Admit: 2023-11-08 | Discharge: 2023-11-08 | Disposition: A | Discharge status: Home / Self Care | Source: Ambulatory Visit | Attending: Oncology | Admitting: Oncology

## 2023-11-08 DIAGNOSIS — C50411 Malignant neoplasm of upper-outer quadrant of right female breast: Secondary | ICD-10-CM | POA: Insufficient documentation

## 2023-11-08 DIAGNOSIS — Z1231 Encounter for screening mammogram for malignant neoplasm of breast: Secondary | ICD-10-CM | POA: Insufficient documentation

## 2023-11-08 DIAGNOSIS — Z170 Estrogen receptor positive status [ER+]: Secondary | ICD-10-CM | POA: Diagnosis present

## 2023-11-09 ENCOUNTER — Inpatient Hospital Stay: Attending: Oncology

## 2023-11-09 DIAGNOSIS — I129 Hypertensive chronic kidney disease with stage 1 through stage 4 chronic kidney disease, or unspecified chronic kidney disease: Secondary | ICD-10-CM | POA: Diagnosis not present

## 2023-11-09 DIAGNOSIS — Z803 Family history of malignant neoplasm of breast: Secondary | ICD-10-CM | POA: Diagnosis not present

## 2023-11-09 DIAGNOSIS — Z923 Personal history of irradiation: Secondary | ICD-10-CM | POA: Diagnosis not present

## 2023-11-09 DIAGNOSIS — Z170 Estrogen receptor positive status [ER+]: Secondary | ICD-10-CM | POA: Diagnosis not present

## 2023-11-09 DIAGNOSIS — Z1721 Progesterone receptor positive status: Secondary | ICD-10-CM | POA: Diagnosis not present

## 2023-11-09 DIAGNOSIS — Z79899 Other long term (current) drug therapy: Secondary | ICD-10-CM | POA: Insufficient documentation

## 2023-11-09 DIAGNOSIS — Z87891 Personal history of nicotine dependence: Secondary | ICD-10-CM | POA: Insufficient documentation

## 2023-11-09 DIAGNOSIS — Z9011 Acquired absence of right breast and nipple: Secondary | ICD-10-CM | POA: Insufficient documentation

## 2023-11-09 DIAGNOSIS — C50911 Malignant neoplasm of unspecified site of right female breast: Secondary | ICD-10-CM | POA: Diagnosis present

## 2023-11-09 DIAGNOSIS — Z801 Family history of malignant neoplasm of trachea, bronchus and lung: Secondary | ICD-10-CM | POA: Diagnosis not present

## 2023-11-09 DIAGNOSIS — Z79811 Long term (current) use of aromatase inhibitors: Secondary | ICD-10-CM | POA: Diagnosis not present

## 2023-11-09 DIAGNOSIS — N189 Chronic kidney disease, unspecified: Secondary | ICD-10-CM | POA: Insufficient documentation

## 2023-11-09 DIAGNOSIS — Z1732 Human epidermal growth factor receptor 2 negative status: Secondary | ICD-10-CM | POA: Diagnosis not present

## 2023-11-09 DIAGNOSIS — D631 Anemia in chronic kidney disease: Secondary | ICD-10-CM | POA: Diagnosis not present

## 2023-11-09 LAB — CBC WITH DIFFERENTIAL (CANCER CENTER ONLY)
Abs Immature Granulocytes: 0.01 K/uL (ref 0.00–0.07)
Basophils Absolute: 0 K/uL (ref 0.0–0.1)
Basophils Relative: 0 %
Eosinophils Absolute: 0.2 K/uL (ref 0.0–0.5)
Eosinophils Relative: 3 %
HCT: 34.8 % — ABNORMAL LOW (ref 36.0–46.0)
Hemoglobin: 11 g/dL — ABNORMAL LOW (ref 12.0–15.0)
Immature Granulocytes: 0 %
Lymphocytes Relative: 32 %
Lymphs Abs: 1.6 K/uL (ref 0.7–4.0)
MCH: 26.3 pg (ref 26.0–34.0)
MCHC: 31.6 g/dL (ref 30.0–36.0)
MCV: 83.3 fL (ref 80.0–100.0)
Monocytes Absolute: 0.4 K/uL (ref 0.1–1.0)
Monocytes Relative: 9 %
Neutro Abs: 2.7 K/uL (ref 1.7–7.7)
Neutrophils Relative %: 56 %
Platelet Count: 178 K/uL (ref 150–400)
RBC: 4.18 MIL/uL (ref 3.87–5.11)
RDW: 13.9 % (ref 11.5–15.5)
WBC Count: 4.9 K/uL (ref 4.0–10.5)
nRBC: 0 % (ref 0.0–0.2)

## 2023-11-09 LAB — CMP (CANCER CENTER ONLY)
ALT: 11 U/L (ref 0–44)
AST: 18 U/L (ref 15–41)
Albumin: 3.8 g/dL (ref 3.5–5.0)
Alkaline Phosphatase: 81 U/L (ref 38–126)
Anion gap: 8 (ref 5–15)
BUN: 38 mg/dL — ABNORMAL HIGH (ref 8–23)
CO2: 26 mmol/L (ref 22–32)
Calcium: 9.8 mg/dL (ref 8.9–10.3)
Chloride: 101 mmol/L (ref 98–111)
Creatinine: 1.68 mg/dL — ABNORMAL HIGH (ref 0.44–1.00)
GFR, Estimated: 30 mL/min — ABNORMAL LOW (ref >60)
Glucose, Bld: 130 mg/dL — ABNORMAL HIGH (ref 70–99)
Potassium: 4.6 mmol/L (ref 3.5–5.1)
Sodium: 135 mmol/L (ref 135–145)
Total Bilirubin: 0.5 mg/dL (ref 0.0–1.2)
Total Protein: 6.9 g/dL (ref 6.5–8.1)

## 2023-11-10 ENCOUNTER — Other Ambulatory Visit: Payer: Self-pay | Admitting: Oncology

## 2023-11-10 DIAGNOSIS — R928 Other abnormal and inconclusive findings on diagnostic imaging of breast: Secondary | ICD-10-CM

## 2023-11-10 LAB — CANCER ANTIGEN 27.29: CA 27.29: 22 U/mL (ref 0.0–38.6)

## 2023-11-15 ENCOUNTER — Encounter: Payer: Self-pay | Admitting: Oncology

## 2023-11-15 ENCOUNTER — Inpatient Hospital Stay: Admitting: Oncology

## 2023-11-15 VITALS — BP 142/62 | HR 73 | Temp 98.1°F | Resp 16 | Wt 151.8 lb

## 2023-11-15 DIAGNOSIS — C50411 Malignant neoplasm of upper-outer quadrant of right female breast: Secondary | ICD-10-CM | POA: Diagnosis not present

## 2023-11-15 DIAGNOSIS — C50911 Malignant neoplasm of unspecified site of right female breast: Secondary | ICD-10-CM | POA: Diagnosis not present

## 2023-11-15 DIAGNOSIS — Z79811 Long term (current) use of aromatase inhibitors: Secondary | ICD-10-CM

## 2023-11-15 DIAGNOSIS — D631 Anemia in chronic kidney disease: Secondary | ICD-10-CM

## 2023-11-15 DIAGNOSIS — N1831 Chronic kidney disease, stage 3a: Secondary | ICD-10-CM | POA: Diagnosis not present

## 2023-11-15 DIAGNOSIS — N183 Chronic kidney disease, stage 3 unspecified: Secondary | ICD-10-CM

## 2023-11-15 DIAGNOSIS — Z170 Estrogen receptor positive status [ER+]: Secondary | ICD-10-CM

## 2023-11-15 DIAGNOSIS — E1122 Type 2 diabetes mellitus with diabetic chronic kidney disease: Secondary | ICD-10-CM | POA: Diagnosis not present

## 2023-11-15 MED ORDER — ANASTROZOLE 1 MG PO TABS: 1.0000 mg | ORAL_TABLET | Freq: Every day | ORAL | 1 refills | Status: DC

## 2023-11-15 NOTE — Progress Notes (Signed)
 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 Continue Arimidex  1 mg daily, plan 5 years, till Jan 2027 Left breast screening mammogram showed calcification.  Obtain left breast diagnostic mammogram.   Bone health Normal bone density in August 2022, continue calcium  1200mg  and vitamin D supplementation. 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  Lab Results  Component Value Date   HGB 11.0 (L) 11/09/2023   TIBC 291 04/12/2023   IRONPCTSAT 31 04/12/2023   FERRITIN 223 04/12/2023     Recommend oral ferrous sulfate  325mg  every other day   CKD stage 3 due to type 2 diabetes mellitus (HCC) 08/27/18 SPEP ordered by nephrology was negative for M protein.   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 Encourage oral hydration and avoid nephrotoxins.     Orders Placed This Encounter  Procedures   CBC with Differential (Cancer Center Only)    Standing Status:   Future    Expected Date:   05/15/2024    Expiration Date:   08/13/2024   CMP (Cancer Center only)    Standing Status:   Future    Expected Date:   05/15/2024    Expiration Date:   08/13/2024   Iron and TIBC    Standing Status:   Future    Expected Date:   05/15/2024    Expiration Date:   08/13/2024   Ferritin    Standing Status:   Future    Expected Date:   05/15/2024    Expiration Date:   08/13/2024    Return of visit:  Oct 2025  All questions were answered. The patient knows to call the clinic with any problems, questions or concerns.  Tracy Cap, MD, PhD [facility] Hematology Oncology 11/15/2023   PERTINENT ONCOLOGY  HISTORY Tracy Lawrence is a  83 y.o.  female with PMH listed below was seen in consultation at the request of  Tracy Lawrence KANDICE, FNP  for evaluation of abnormal SPEP. Reviewed patient's previous blood work via care everywhere. 08/27/2018, free light chain ratio 1.97, free kappa 79.5, free lambda 40.3. Protein electrophoresis showed increased alpha globulin.  #Patient moved from [place]  to [place]  last year. She reports history of right breast cancer in 2002 and left breast cancer in 2011.  She underwent right breast lumpectomy followed by adjuvant radiation followed by 5 years of tamoxifen.  Underwent left lumpectomy followed by adjuvant radiation followed by aromatase inhibitor for 5 years.  Currently she is not on any antiestrogen treatment.  Denies any chemotherapy treatment history.  Both breast cancer treatments were done in [place] . Obtained records from [facility] clinic breast surgery [facility] cancer center Records were reviewed.  No pathology is available. 06/06/2017 screening mammogram showed postoperative changes in both breasts.  Architectural distortion has not changed significantly.  Skin thickening is similar.  Calcifications have increased mildly around the postoperative sites bilaterally.  No new dominant mass.   # Chronic history of kidney insufficiency, stage IIIb.  Patient sees Dr. Marcelino.  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  # Breast exam was performed in seated and lying down position. Right breast lower  inner quadrant lumpectomy scar with focal scar tissue. Left breast upper outer quadrant, 1:00 lumpectomy scar with palpable round thickened breast tissue. No palpable axillary lymphadenopathy.  # #Family history of breast cancer in first-degree relatives, patient reports that she was previously tested negative for BRCA gene mutation.  Results were not available. Patient was referred to genetic counselor and 11/19/2019  [facility] Breast Cancer STAT Panel + Multi- cancer panel found no pathogenic mutations.   # 09/17/2019 bilateral screening mammogram showed right breast mass which warrants further evaluation.  No suspicious findings in left breast. 09/25/2019, right diagnostic breast mammogram showed specialist right breast mass at 10:00. Right axilla is negative for adenopathy.  Patient underwent ultrasound-guided core biopsy of the right breast 10:00 location. 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  Grade 2, no DCIS or LVI.  ER 90%, PR 11-50%, HER-2 negative.  Chronic microcytic anemia, iron panel is not consistent with iron deficiency. Normal hemoglobin evaluation.  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 Hemoglobin stable at 11.6. GI work-up includes EGD and colonoscopy on 06/14/2019.  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  Benign colon polyps.  No high-grade dysplasia or malignancy.  She follows up with Dr. Unk Anemia can be also secondary to CKD.  # #01/06/2020 patient underwent right mastectomy and sentinel lymph node biopsy.  Invasive mammary carcinoma, ductal, grade 1, negative margin, all 7 lymph nodes negative pT1b pN0  Oncotype DX recurrence score is 17, patient above 46 years old, distant recurrence risk at 9 years 5%, chemotherapy benefit less than 1%.  No adjuvant chemotherapy will be offered She had mastectomy with negative margin, no need for adjuvant radiation. Baseline elevated CA 27.29--> post operation normalization of CEA 27.29 February 2020, started on Arimidex  She was seen by Dr. Dessa 04/02/2020 for seroma.  #INTERVAL HISTORY Tracy Lawrence is a 82 y.o. female who has above history reviewed by me today presents for follow up visit for breast cancer.   She has manageable side effects from Arimidex .  + chronic Joint pain.  Patient has no new complaints   Review of Systems  Constitutional:  Negative for appetite change, chills, fatigue and  fever.  HENT:   Negative for hearing loss and voice change.   Eyes:  Negative for eye problems.  Respiratory:  Negative for chest tightness and cough.   Cardiovascular:  Negative for chest pain.  Gastrointestinal:  Negative for abdominal distention, abdominal pain and blood in stool.  Endocrine: Positive for hot flashes.  Genitourinary:  Negative for difficulty urinating and frequency.   Musculoskeletal:  Positive for arthralgias.  Skin:  Negative for itching and rash.  Neurological:  Negative for extremity weakness.  Hematological:  Negative for adenopathy.  Psychiatric/Behavioral:  Negative for confusion.    Right mastectomy MEDICAL HISTORY:  Past Medical History:  Diagnosis Date   Anemia    Arthritis    Breast cancer (HCC) 2002   right breast   Breast cancer (HCC) 2011   left breast   Cancer (HCC)    Family history of breast cancer    Family history of lung cancer    Family history of non-Hodgkin's lymphoma    Family history of prostate cancer    Hypertension    Malignant neoplasm of right breast [facility])    Personal history of radiation therapy 2002, 2011   right and left breast   Pre-diabetes     SURGICAL HISTORY: Past Surgical History:  Procedure Laterality Date   ABDOMINAL HYSTERECTOMY  06-Dec-2016   BREAST BIOPSY Right 10/04/2018   u/s bx, vision marker, path pending   BREAST LUMPECTOMY Right 2000/12/06   breast ca with rad   BREAST LUMPECTOMY Left 12-06-2009   breast ca with rad   BREAST SURGERY  2009-12-06   CATARACT EXTRACTION  2015   COLONOSCOPY     COLONOSCOPY WITH PROPOFOL  N/A 06/14/2019   Procedure: COLONOSCOPY WITH PROPOFOL ;  Surgeon: Tracy Lawrence Corinn Skiff, MD;  Location: [facility] ENDOSCOPY;  Service: Gastroenterology;  Laterality: N/A;   COLONOSCOPY WITH PROPOFOL  N/A 07/15/2022   Procedure: COLONOSCOPY WITH PROPOFOL ;  Surgeon: Tracy Lawrence Corinn Skiff, MD;  Location: [facility] ENDOSCOPY;  Service: Gastroenterology;  Laterality: N/A;   ESOPHAGOGASTRODUODENOSCOPY (EGD) WITH PROPOFOL  N/A  06/14/2019   Procedure: ESOPHAGOGASTRODUODENOSCOPY (EGD) WITH PROPOFOL ;  Surgeon: Tracy Lawrence Corinn Skiff, MD;  Location: [facility] ENDOSCOPY;  Service: Gastroenterology;  Laterality: N/A;   EYE SURGERY     MASTECTOMY     SIMPLE MASTECTOMY WITH AXILLARY SENTINEL NODE BIOPSY Right 01/06/2020   Procedure: SIMPLE MASTECTOMY WITH AXILLARY SENTINEL NODE BIOPSY;  Surgeon: Dessa Reyes ORN, MD;  Location: [facility] ORS;  Service: General;  Laterality: Right;    SOCIAL HISTORY: Social History   Socioeconomic History   Marital status: Married    Spouse name: Renny   Number of children: 2   Years of education: Not on file   Highest education level: Not on file  Occupational History   Occupation: retired  Tobacco Use   Smoking status: Former    Current packs/day: 0.00    Average packs/day: 1 pack/day for 12.0 years (12.0 ttl pk-yrs)    Types: Cigarettes    Start date: 03/18/1967    Quit date: 03/18/1979    Years since quitting: 44.6   Smokeless tobacco: Never   Tobacco comments:    quit 40 years ago  Vaping Use   Vaping status: Never Used  Substance and Sexual Activity   Alcohol use: Yes    Comment: rarely, 1 glass of wine monthly or less   Drug use: Never   Sexual activity: Not on file  Other Topics Concern   Not on file  Social History Narrative   From [place]       Retired, volunteers 1 day at week at [employer]      Has 2 children, 1 son deceased 12/06/12   Social Drivers of Corporate investment banker Strain: Low Risk  (11/10/2022)   Overall Financial Resource Strain (CARDIA)    Difficulty of Paying Living Expenses: Not hard at all  Food Insecurity: No Food Insecurity (11/10/2022)   Hunger Vital Sign    Worried About Running Out of Food in the Last Year: Never true    Ran Out of Food in the Last Year: Never true  Transportation Needs: No Transportation Needs (11/10/2022)   PRAPARE - Administrator, Civil Service (Medical): No    Lack of Transportation (Non-Medical): No   Physical Activity: Insufficiently Active (11/10/2022)   Exercise Vital Sign    Days of Exercise per Week: 2 days    Minutes of Exercise per Session: 30 min  Stress: No Stress Concern Present (11/10/2022)   [employer] of Occupational Health - Occupational Stress Questionnaire    Feeling of Stress : Not at all  Social Connections: Moderately Integrated (11/10/2022)   Social Connection and Isolation Panel    Frequency of Communication with Friends and Family: Twice a week    Frequency of Social Gatherings with Friends and Family: More than three times  a week    Attends Religious Services: More than 4 times per year    Active Member of Clubs or Organizations: No    Attends Banker Meetings: Never    Marital Status: Married  Catering manager Violence: Not At Risk (11/10/2022)   Humiliation, Afraid, Rape, and Kick questionnaire    Fear of Current or Ex-Partner: No    Emotionally Abused: No    Physically Abused: No    Sexually Abused: No    FAMILY HISTORY: Family History  Problem Relation Age of Onset   Heart attack Mother    Cancer Father    Lung cancer Father    Breast cancer Sister 32       dx again 81   Heart attack Sister    Non-Hodgkin's lymphoma Sister 40   Heart attack Sister    Dementia Sister 43   Breast cancer Paternal Aunt    Breast cancer Maternal Aunt    Prostate cancer Paternal Grandfather        metastic, d. 76s   Colon cancer Neg Hx     ALLERGIES:  has no known allergies.  MEDICATIONS:  Current Outpatient Medications  Medication Sig Dispense Refill   acetaminophen  (TYLENOL ) 500 MG tablet Take 1,000 mg by mouth every 6 (six) hours as needed for moderate pain or headache.     allopurinol  (ZYLOPRIM ) 100 MG tablet TAKE 1 TABLET(100 MG) BY MOUTH DAILY 90 tablet 3   Calcium  Carb-Cholecalciferol (CALCIUM  600 + D PO) Take 1 tablet by mouth daily.     calcium  carbonate (TUMS - DOSED IN MG ELEMENTAL CALCIUM ) 500 MG chewable tablet Chew 1,000 mg  by mouth daily as needed for indigestion or heartburn.     Carboxymethylcellul-Glycerin (LUBRICATING EYE DROPS OP) Place 1 drop into both eyes daily as needed (irritated eyes).     dapagliflozin  propanediol (FARXIGA ) 10 MG TABS tablet Take 1 tablet (10 mg total) by mouth daily. 90 tablet 3   escitalopram  (LEXAPRO ) 10 MG tablet TAKE 1 TABLET(10 MG) BY MOUTH DAILY 90 tablet 3   ezetimibe  (ZETIA ) 10 MG tablet TAKE 1 TABLET(10 MG) BY MOUTH DAILY 90 tablet 1   ferrous sulfate  325 (65 FE) MG tablet Take 1 tablet (325 mg total) by mouth daily.     hydrochlorothiazide  (MICROZIDE ) 12.5 MG capsule TAKE 1 CAPSULE(12.5 MG) BY MOUTH DAILY 90 capsule 3   losartan  (COZAAR ) 100 MG tablet TAKE 1 TABLET(100 MG) BY MOUTH DAILY 90 tablet 3   Multiple Vitamins-Minerals (EYE VITAMINS PO) Take 1 tablet by mouth daily.      rosuvastatin  (CRESTOR ) 40 MG tablet TAKE 1 TABLET BY MOUTH EVERY DAY FOR CHOLESTEROL 90 tablet 3   anastrozole  (ARIMIDEX ) 1 MG tablet Take 1 tablet (1 mg total) by mouth daily. 90 tablet 1   No current facility-administered medications for this visit.     PHYSICAL EXAMINATION: ECOG PERFORMANCE STATUS: 0 - Asymptomatic Vitals:   11/15/23 1019  BP: (!) 142/62  Pulse: 73  Resp: 16  Temp: 98.1 F (36.7 C)  SpO2: 99%   Filed Weights   11/15/23 1019  Weight: 151 lb 12.8 oz (68.9 kg)    Physical Exam Constitutional:      General: She is not in acute distress. HENT:     Head: Normocephalic and atraumatic.  Eyes:     General: No scleral icterus. Cardiovascular:     Rate and Rhythm: Normal rate and regular rhythm.     Heart sounds: Normal heart sounds.  Pulmonary:     Effort: Pulmonary effort is normal. No respiratory distress.     Breath sounds: No wheezing.  Abdominal:     General: Bowel sounds are normal. There is no distension.     Palpations: Abdomen is soft.  Musculoskeletal:        General: No deformity. Normal range of motion.     Cervical back: Normal range of motion and  neck supple.  Skin:    General: Skin is warm and dry.     Findings: No erythema or rash.  Neurological:     Mental Status: She is alert and oriented to person, place, and time. Mental status is at baseline.     Cranial Nerves: No cranial nerve deficit.     Coordination: Coordination normal.  Psychiatric:        Mood and Affect: Mood normal.   Breast exam was performed in seated and lying down position. Right breast mastectomy.  Mild edema of right axillary  Left breast upper outer quadrant, 1:00 lumpectomy scar with palpable round thickened breast tissue.   LABORATORY DATA:  I have reviewed the data as listed     Latest Ref Rng & Units 11/09/2023   10:20 AM 04/12/2023   10:31 AM 10/14/2022   11:07 AM  CBC  WBC 4.0 - 10.5 K/uL 4.9  6.2  6.5   Hemoglobin 12.0 - 15.0 g/dL 88.9  88.2  88.4   Hematocrit 36.0 - 46.0 % 34.8  37.9  36.9   Platelets 150 - 400 K/uL 178  180  192       Latest Ref Rng & Units 11/09/2023   10:20 AM 04/12/2023   10:31 AM 10/14/2022   11:07 AM  CMP  Glucose 70 - 99 mg/dL 869  890  878   BUN 8 - 23 mg/dL 38  40  41   Creatinine 0.44 - 1.00 mg/dL 8.31  8.49  8.37   Sodium 135 - 145 mmol/L 135  133  129   Potassium 3.5 - 5.1 mmol/L 4.6  4.6  4.1   Chloride 98 - 111 mmol/L 101  100  97   CO2 22 - 32 mmol/L 26  26  23    Calcium  8.9 - 10.3 mg/dL 9.8  89.7  9.9   Total Protein 6.5 - 8.1 g/dL 6.9  7.3  7.3   Total Bilirubin 0.0 - 1.2 mg/dL 0.5  0.6  0.5   Alkaline Phos 38 - 126 U/L 81  59  54   AST 15 - 41 U/L 18  17  18    ALT 0 - 44 U/L 11  14  17      Iron/TIBC/Ferritin/ %Sat    Component Value Date/Time   IRON 89 04/12/2023 1031   TIBC 291 04/12/2023 1031   FERRITIN 223 04/12/2023 1031   IRONPCTSAT 31 04/12/2023 1031      RADIOGRAPHIC STUDIES: I have personally reviewed the radiological images as listed and agreed with the findings in the report. MM 3D SCREENING MAMMOGRAM UNILATERAL LEFT BREAST Result Date: 11/10/2023 CLINICAL DATA:  Screening.  EXAM: DIGITAL SCREENING UNILATERAL LEFT MAMMOGRAM WITH CAD AND TOMOSYNTHESIS TECHNIQUE: Left screening digital craniocaudal and mediolateral oblique mammograms were obtained. Left screening digital breast tomosynthesis was performed. The images were evaluated with computer-aided detection. COMPARISON:  Previous exam(s). ACR Breast Density Category b: There are scattered areas of fibroglandular density. FINDINGS: In the left breast, calcifications warrant further evaluation. Patient has had a right mastectomy. IMPRESSION: Further evaluation is  suggested for calcifications in the left breast. RECOMMENDATION: Diagnostic mammogram of the left breast. (Code:[id number]-L-41M) The patient will be contacted regarding the findings, and additional imaging will be scheduled. BI-RADS CATEGORY  0: Incomplete: Need additional imaging evaluation. Electronically Signed   By: Dina  Arceo M.D.   On: 11/10/2023 08:26

## 2023-11-15 NOTE — Assessment & Plan Note (Signed)
08/27/18 SPEP ordered by nephrology was negative for M protein.   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 Encourage oral hydration and avoid nephrotoxins.

## 2023-11-15 NOTE — Assessment & Plan Note (Addendum)
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 Continue Arimidex  1 mg daily, plan 5 years, till Jan 2027 Left breast screening mammogram showed calcification.  Obtain left breast diagnostic mammogram.   Bone health Normal bone density in August 2022, continue calcium  1200mg  and vitamin D supplementation. Normal DEXA scheduled in Oct 2024

## 2023-11-15 NOTE — Assessment & Plan Note (Addendum)
 Anemia of CKD, also alpha thalassemia trait.  Lab Results  Component Value Date   HGB 11.0 (L) 11/09/2023   TIBC 291 04/12/2023   IRONPCTSAT 31 04/12/2023   FERRITIN 223 04/12/2023     Recommend oral ferrous sulfate  325mg  every other day

## 2023-11-16 ENCOUNTER — Ambulatory Visit
Admission: RE | Admit: 2023-11-16 | Discharge: 2023-11-16 | Disposition: A | Discharge status: Home / Self Care | Source: Ambulatory Visit | Attending: Oncology | Admitting: Oncology

## 2023-11-16 DIAGNOSIS — R928 Other abnormal and inconclusive findings on diagnostic imaging of breast: Secondary | ICD-10-CM | POA: Diagnosis present

## 2023-11-21 ENCOUNTER — Ambulatory Visit
Admission: RE | Admit: 2023-11-21 | Discharge: 2023-11-21 | Disposition: A | Discharge status: Home / Self Care | Source: Ambulatory Visit | Attending: Oncology | Admitting: Oncology

## 2023-11-21 DIAGNOSIS — R928 Other abnormal and inconclusive findings on diagnostic imaging of breast: Secondary | ICD-10-CM

## 2023-11-21 DIAGNOSIS — D0512 Intraductal carcinoma in situ of left breast: Secondary | ICD-10-CM | POA: Diagnosis present

## 2023-11-21 HISTORY — PX: BREAST BIOPSY: SHX20

## 2023-11-21 MED ORDER — LIDOCAINE-EPINEPHRINE 1 %-1:100000 IJ SOLN
20.0000 mL | Freq: Once | INTRAMUSCULAR | Status: AC
  Administered 2023-11-21: 20 mL
  Filled 2023-11-21: qty 20

## 2023-11-21 MED ORDER — LIDOCAINE 1 % OPTIME INJ - NO CHARGE
5.0000 mL | Freq: Once | INTRAMUSCULAR | Status: AC
  Administered 2023-11-21: 5 mL
  Filled 2023-11-21: qty 6

## 2023-11-22 ENCOUNTER — Telehealth: Payer: Self-pay

## 2023-11-22 LAB — SURGICAL PATHOLOGY

## 2023-11-22 NOTE — Telephone Encounter (Signed)
 Copied from CRM [id number]. Topic: Clinical - Lab/Test Results >> Nov 22, 2023  4:10 PM Sasha M wrote: Reason for CRM: Pt daughter Tillman called because she accessed her mothers mother mychart to view test results and seen abnormal findings. She is concerned because pt hasn't been made aware just yet and would like someone call asap to go over the results and next steps hopefully today. She will be visiting her mother this eveing and isn't sure if she should break the news first or wait until they hear from the doctor to do it.  I spoke with Tillman and she states patient has already been given the results by a nurse who called from the office where they did the biopsy.

## 2023-11-23 ENCOUNTER — Encounter: Payer: Self-pay | Admitting: *Deleted

## 2023-11-23 DIAGNOSIS — D0512 Intraductal carcinoma in situ of left breast: Secondary | ICD-10-CM

## 2023-11-23 NOTE — Telephone Encounter (Signed)
 noted

## 2023-11-23 NOTE — Progress Notes (Signed)
 Received referral for newly diagnosed breast cancer from [facility].  Navigation initiated.  Tracy Lawrence is already an established patient of Dr. Babara, she will see her after surgery.  Referral placed to Dr. Jordis at [facility] surgical, his office will call with the appt.

## 2023-11-29 ENCOUNTER — Ambulatory Visit

## 2023-11-30 ENCOUNTER — Telehealth: Payer: Self-pay

## 2023-11-30 NOTE — Telephone Encounter (Signed)
 PAP: Patient assistance application for Farxiga through [employer] ([employer]) has been mailed to pt's home address on file. Provider portion of application will be faxed to provider's office.

## 2023-12-04 ENCOUNTER — Encounter: Payer: Self-pay | Admitting: Surgery

## 2023-12-04 ENCOUNTER — Ambulatory Visit: Admitting: Surgery

## 2023-12-04 VITALS — BP 139/77 | HR 86 | Temp 97.7°F | Ht 62.0 in | Wt 149.4 lb

## 2023-12-04 DIAGNOSIS — D0512 Intraductal carcinoma in situ of left breast: Secondary | ICD-10-CM

## 2023-12-04 MED ORDER — LIDOCAINE-PRILOCAINE 2.5-2.5 % EX CREA: TOPICAL_CREAM | CUTANEOUS | 0 refills | Status: AC

## 2023-12-04 NOTE — H&P (View-Only) (Signed)
 Patient ID: Tracy Lawrence, female   DOB: 06-May-1940, 83 y.o.   MRN: [id number]  HPI Tracy Lawrence is a 83 y.o. female seen for recently diagnosed left breast CA on routine mammogram She did have a hx of Right mastectomy by  2021 by Dr Dessa Left breast CA left lumpectomy 2011 [place]  w mammosite Recent Mammo pers reviewed showing branching calcification Upper outer quadrant . Biopsy revealed DCIS w necrosis intermediate , ER + She is fairly active and is able to perform more than 4 METS of activity without shortness of breath or chest pain. Creat 1.68 hb 11   HPI  Past Medical History:  Diagnosis Date   Anemia    Arthritis    Breast cancer (HCC) 2002   right breast   Breast cancer (HCC) 2011   left breast   Cancer (HCC)    Family history of breast cancer    Family history of lung cancer    Family history of non-Hodgkin's lymphoma    Family history of prostate cancer    Hypertension    Malignant neoplasm of right breast [facility])    Personal history of radiation therapy 2002, 2011   right and left breast   Pre-diabetes     Past Surgical History:  Procedure Laterality Date   ABDOMINAL HYSTERECTOMY  2018   BREAST BIOPSY Right 10/04/2018   u/s bx, vision marker, path pending   BREAST BIOPSY Left 11/21/2023   MM LT BREAST BX W LOC DEV 1ST LESION IMAGE BX SPEC STEREO GUIDE 11/21/2023 [facility]   BREAST LUMPECTOMY Right 2002   breast ca with rad   BREAST LUMPECTOMY Left 2011   breast ca with rad   BREAST SURGERY  2011,2002   CATARACT EXTRACTION  2015   COLONOSCOPY     COLONOSCOPY WITH PROPOFOL  N/A 06/14/2019   Procedure: COLONOSCOPY WITH PROPOFOL ;  Surgeon: Unk Corinn Skiff, MD;  Location: [facility] ENDOSCOPY;  Service: Gastroenterology;  Laterality: N/A;   COLONOSCOPY WITH PROPOFOL  N/A 07/15/2022   Procedure: COLONOSCOPY WITH PROPOFOL ;  Surgeon: Unk Corinn Skiff, MD;  Location: [facility] ENDOSCOPY;  Service: Gastroenterology;  Laterality: N/A;    ESOPHAGOGASTRODUODENOSCOPY (EGD) WITH PROPOFOL  N/A 06/14/2019   Procedure: ESOPHAGOGASTRODUODENOSCOPY (EGD) WITH PROPOFOL ;  Surgeon: Unk Corinn Skiff, MD;  Location: [facility] ENDOSCOPY;  Service: Gastroenterology;  Laterality: N/A;   EYE SURGERY     MASTECTOMY     SIMPLE MASTECTOMY WITH AXILLARY SENTINEL NODE BIOPSY Right 01/06/2020   Procedure: SIMPLE MASTECTOMY WITH AXILLARY SENTINEL NODE BIOPSY;  Surgeon: Dessa Reyes ORN, MD;  Location: [facility] ORS;  Service: General;  Laterality: Right;    Family History  Problem Relation Age of Onset   Heart attack Mother    Cancer Father    Lung cancer Father    Breast cancer Sister 87       dx again 45   Heart attack Sister    Non-Hodgkin's lymphoma Sister 70   Heart attack Sister    Dementia Sister 80   Breast cancer Paternal Aunt    Breast cancer Maternal Aunt    Prostate cancer Paternal Grandfather        metastic, d. 62s   Colon cancer Neg Hx     Social History Social History   Tobacco Use   Smoking status: Former    Current packs/day: 0.00    Average packs/day: 1 pack/day for 12.0 years (12.0 ttl pk-yrs)    Types: Cigarettes    Start date: 03/18/1967    Quit date: 03/18/1979  Years since quitting: 44.7   Smokeless tobacco: Never   Tobacco comments:    quit 40 years ago  Vaping Use   Vaping status: Never Used  Substance Use Topics   Alcohol use: Yes    Comment: rarely, 1 glass of wine monthly or less   Drug use: Never    No Known Allergies  Current Outpatient Medications  Medication Sig Dispense Refill   acetaminophen  (TYLENOL ) 500 MG tablet Take 1,000 mg by mouth every 6 (six) hours as needed for moderate pain or headache.     allopurinol  (ZYLOPRIM ) 100 MG tablet TAKE 1 TABLET(100 MG) BY MOUTH DAILY 90 tablet 3   anastrozole  (ARIMIDEX ) 1 MG tablet Take 1 tablet (1 mg total) by mouth daily. 90 tablet 1   Calcium  Carb-Cholecalciferol (CALCIUM  600 + D PO) Take 1 tablet by mouth daily.     calcium  carbonate (TUMS - DOSED  IN MG ELEMENTAL CALCIUM ) 500 MG chewable tablet Chew 1,000 mg by mouth daily as needed for indigestion or heartburn.     Carboxymethylcellul-Glycerin (LUBRICATING EYE DROPS OP) Place 1 drop into both eyes daily as needed (irritated eyes).     dapagliflozin  propanediol (FARXIGA ) 10 MG TABS tablet Take 1 tablet (10 mg total) by mouth daily. 90 tablet 3   escitalopram  (LEXAPRO ) 10 MG tablet TAKE 1 TABLET(10 MG) BY MOUTH DAILY 90 tablet 3   ezetimibe  (ZETIA ) 10 MG tablet TAKE 1 TABLET(10 MG) BY MOUTH DAILY 90 tablet 1   ferrous sulfate  325 (65 FE) MG tablet Take 1 tablet (325 mg total) by mouth daily.     hydrochlorothiazide  (MICROZIDE ) 12.5 MG capsule TAKE 1 CAPSULE(12.5 MG) BY MOUTH DAILY 90 capsule 3   losartan  (COZAAR ) 100 MG tablet TAKE 1 TABLET(100 MG) BY MOUTH DAILY 90 tablet 3   Multiple Vitamins-Minerals (EYE VITAMINS PO) Take 1 tablet by mouth daily.      rosuvastatin  (CRESTOR ) 40 MG tablet TAKE 1 TABLET BY MOUTH EVERY DAY FOR CHOLESTEROL 90 tablet 3   No current facility-administered medications for this visit.     Review of Systems Full ROS  was asked and was negative except for the information on the HPI  Physical Exam Blood pressure 139/77, pulse 86, temperature 97.7 F (36.5 C), temperature source Oral, height 5' 2 (1.575 m), weight 149 lb 6.4 oz (67.8 kg), SpO2 96%. CONSTITUTIONAL: NAD. EYES: Pupils are equal, round, Sclera are non-icteric. EARS, NOSE, MOUTH AND THROAT: The oropharynx is clear. The oral mucosa is pink and moist. Hearing is intact to voice. LYMPH NODES:  Lymph nodes in the neck are normal. RESPIRATORY:  Lungs are clear. There is normal respiratory effort, with equal breath sounds bilaterally, and without pathologic use of accessory muscles. CARDIOVASCULAR: Heart is regular without murmurs, gallops, or rubs. BREAST: Right chest wall prior evidence of mastectomy scar. No lymphedema or chest wall lesions. Left side Prior lumpectomy located at 11:00 and radiation  changes, Difficult to distinguish a discrete mass from surgical and radiation changes. Skin and nipple intact, no LAD. GI: The abdomen is  soft, nontender, and nondistended. There are no palpable masses. There is no hepatosplenomegaly. There are normal bowel sounds in all quadrants. GU: Rectal deferred.   MUSCULOSKELETAL: Normal muscle strength and tone. No cyanosis or edema.   SKIN: Turgor is good and there are no pathologic skin lesions or ulcers. NEUROLOGIC: Motor and sensation is grossly normal. Cranial nerves are grossly intact. PSYCH:  Oriented to person, place and time. Affect is normal.  Data Reviewed  I have personally reviewed the patient's imaging, laboratory findings and medical records.    Assessment/Plan 83 year old female with prior history of breast cancer bilaterally now with new breast cancer on the left side in the prior breast that had lumpectomy and radiation therapy.  Discussed with the patient in detail about her disease process.  Given the prior radiation I do recommend mastectomy.  She is in full agreement.  I do think that we can proceed with mastectomy formed.  Will discuss with Dr. Reviewed to make sure she agrees with plan.  Tentatively we will plan for simple mastectomy of the left side with sentinel lymph node biopsy.  Procedure discussed with her and her husband in detail.  The risk benefits and the possible medications including but not limited to: Bleeding, infection, seroma, edema, flap necrosis for healing.  This is particularly pertinent to her given that she had prior radiation and also prior surgery at bedside.  She fully understands. I personally spent a total of 60 minutes in the care of the patient today including performing a medically appropriate exam/evaluation, counseling and educating, placing orders, referring and communicating with other health care professionals, documenting clinical information in the EHR, independently interpreting and reviewing images  studies and coordinating care.    Laneta Luna, MD FACS General Surgeon 12/04/2023, 3:51 PM

## 2023-12-04 NOTE — Patient Instructions (Signed)
 We have spoken today about breast surgery. Your Mastectomy will be scheduled at [facility] with Dr. Jordis.  If you are on any injectable weight loss medication, you will need to stop taking your GLP-1 injectable (weight loss) medications 8 days before your surgery to avoid any complications with anesthesia.   You will most likely go home the same day but may spend at least 1 night in the hospital following surgery and go home with 1-2 drains for approximately 5-7 days following your surgery. Please keep an accurate record of your drain amount in ml's or cc's. If your drain suddenly stops draining or has drainage around the tube at the skin, call our office and speak with a nurse immediately.  Please see the (blue) pre-care surgery sheet that you have been given today for more information regarding your surgery. Our surgery scheduler will call you to look at surgery dates and to go over information about your surgery.   If you have FMLA or Disability paperwork that needs to be filled out, please have your company fax your paperwork to [phone] or you may drop this by either office. This paperwork will be filled out within 3 days after your surgery has been completed.  Information regarding your surgery has been provided below. If you have any questions or concerns, please call our office and speak with a nurse.   Total or Modified Radical Mastectomy A total mastectomy and a modified radical mastectomy are types of surgery for breast cancer. If you are having a total mastectomy (simple mastectomy), your entire breast will be removed. If you are having a modified radical mastectomy, your breast and nipple will be removed along with the lymph nodes under your arm. You may also have some of the lining over the muscle tissues under your breast removed. LET [facility] CARE PROVIDER KNOW ABOUT: Any allergies you have. All medicines you are taking, including vitamins, herbs, eye drops, creams, and  over-the-counter medicines. Previous problems you or members of your family have had with the use of anesthetics. Any blood disorders you have. Previous surgeries you have had. Medical conditions you have. RISKS AND COMPLICATIONS Generally, this is a safe procedure. However, problems may occur, including: Pain. Infection. Bleeding. Scar tissue. Chest numbness on the side of the surgery. Fluid buildup under the skin flaps where your breast was removed (seroma). Sensation of throbbing or tingling. Stress or sadness from losing your breast. If you have the lymph nodes under your arm removed, you may have arm swelling, weakness, or numbness on the same side of your body as your surgery. BEFORE THE PROCEDURE Ask your health care provider about: Changing or stopping your regular medicines. This is especially important if you are taking diabetes medicines or blood thinners. Taking medicines such as aspirin and ibuprofen. These medicines can thin your blood. Do not take these medicines before your procedure if your health care provider instructs you not to. Follow your health care provider's instructions about eating or drinking restrictions. Plan to have someone take you home after the procedure. PROCEDURE An IV tube will be inserted into one of your veins. You will be given a medicine that makes you fall asleep (general anesthetic). Your breast will be cleaned with a germ-killing solution (antiseptic). A wide incision will be made around your nipple. The skin and nipple inside the incision will be removed along with all breast tissue. If you are having a modified radical mastectomy: The lining over your chest muscles will be removed.  The incision may be extended to reach the lymph nodes under your arm, or a second incision may be made. The lymph nodes will be removed. You may have a drainage tube inserted into your incision to collect fluid that builds up after surgery. This tube is  connected to a suction bulb. Your incision or incisions will be closed with stitches (sutures). A bandage (dressing) will be placed over your breast and under your arm. The procedure may vary among health care providers and hospitals. AFTER THE PROCEDURE You will be moved to a recovery area. Your blood pressure, heart rate, breathing rate, and blood oxygen level will be monitored often until the medicines you were given have worn off. You will be given pain medicine as needed. After a while, you will be taken to a hospital room. You will be encouraged to get up and walk as soon as you can. Your IV tube can be removed when you are able to eat and drink. Your drain may be removed before you go home from the hospital, or you may be sent home with your drain and suction bulb.   This information is not intended to replace advice given to you by your health care provider. Make sure you discuss any questions you have with your health care provider.   Document Released: 10/12/2000 Document Revised: 02/07/2014 Document Reviewed: 10/02/2013 Elsevier Interactive Patient Education [employer]

## 2023-12-04 NOTE — Progress Notes (Signed)
 Patient ID: Tracy Lawrence, female   DOB: 06-May-1940, 83 y.o.   MRN: [id number]  HPI Tracy Lawrence is a 83 y.o. female seen for recently diagnosed left breast CA on routine mammogram She did have a hx of Right mastectomy by  2021 by Dr Dessa Left breast CA left lumpectomy 2011 [place]  w mammosite Recent Mammo pers reviewed showing branching calcification Upper outer quadrant . Biopsy revealed DCIS w necrosis intermediate , ER + She is fairly active and is able to perform more than 4 METS of activity without shortness of breath or chest pain. Creat 1.68 hb 11   HPI  Past Medical History:  Diagnosis Date   Anemia    Arthritis    Breast cancer (HCC) 2002   right breast   Breast cancer (HCC) 2011   left breast   Cancer (HCC)    Family history of breast cancer    Family history of lung cancer    Family history of non-Hodgkin's lymphoma    Family history of prostate cancer    Hypertension    Malignant neoplasm of right breast [facility])    Personal history of radiation therapy 2002, 2011   right and left breast   Pre-diabetes     Past Surgical History:  Procedure Laterality Date   ABDOMINAL HYSTERECTOMY  2018   BREAST BIOPSY Right 10/04/2018   u/s bx, vision marker, path pending   BREAST BIOPSY Left 11/21/2023   MM LT BREAST BX W LOC DEV 1ST LESION IMAGE BX SPEC STEREO GUIDE 11/21/2023 [facility]   BREAST LUMPECTOMY Right 2002   breast ca with rad   BREAST LUMPECTOMY Left 2011   breast ca with rad   BREAST SURGERY  2011,2002   CATARACT EXTRACTION  2015   COLONOSCOPY     COLONOSCOPY WITH PROPOFOL  N/A 06/14/2019   Procedure: COLONOSCOPY WITH PROPOFOL ;  Surgeon: Unk Corinn Skiff, MD;  Location: [facility] ENDOSCOPY;  Service: Gastroenterology;  Laterality: N/A;   COLONOSCOPY WITH PROPOFOL  N/A 07/15/2022   Procedure: COLONOSCOPY WITH PROPOFOL ;  Surgeon: Unk Corinn Skiff, MD;  Location: [facility] ENDOSCOPY;  Service: Gastroenterology;  Laterality: N/A;    ESOPHAGOGASTRODUODENOSCOPY (EGD) WITH PROPOFOL  N/A 06/14/2019   Procedure: ESOPHAGOGASTRODUODENOSCOPY (EGD) WITH PROPOFOL ;  Surgeon: Unk Corinn Skiff, MD;  Location: [facility] ENDOSCOPY;  Service: Gastroenterology;  Laterality: N/A;   EYE SURGERY     MASTECTOMY     SIMPLE MASTECTOMY WITH AXILLARY SENTINEL NODE BIOPSY Right 01/06/2020   Procedure: SIMPLE MASTECTOMY WITH AXILLARY SENTINEL NODE BIOPSY;  Surgeon: Dessa Reyes ORN, MD;  Location: [facility] ORS;  Service: General;  Laterality: Right;    Family History  Problem Relation Age of Onset   Heart attack Mother    Cancer Father    Lung cancer Father    Breast cancer Sister 87       dx again 45   Heart attack Sister    Non-Hodgkin's lymphoma Sister 70   Heart attack Sister    Dementia Sister 80   Breast cancer Paternal Aunt    Breast cancer Maternal Aunt    Prostate cancer Paternal Grandfather        metastic, d. 62s   Colon cancer Neg Hx     Social History Social History   Tobacco Use   Smoking status: Former    Current packs/day: 0.00    Average packs/day: 1 pack/day for 12.0 years (12.0 ttl pk-yrs)    Types: Cigarettes    Start date: 03/18/1967    Quit date: 03/18/1979  Years since quitting: 44.7   Smokeless tobacco: Never   Tobacco comments:    quit 40 years ago  Vaping Use   Vaping status: Never Used  Substance Use Topics   Alcohol use: Yes    Comment: rarely, 1 glass of wine monthly or less   Drug use: Never    No Known Allergies  Current Outpatient Medications  Medication Sig Dispense Refill   acetaminophen  (TYLENOL ) 500 MG tablet Take 1,000 mg by mouth every 6 (six) hours as needed for moderate pain or headache.     allopurinol  (ZYLOPRIM ) 100 MG tablet TAKE 1 TABLET(100 MG) BY MOUTH DAILY 90 tablet 3   anastrozole  (ARIMIDEX ) 1 MG tablet Take 1 tablet (1 mg total) by mouth daily. 90 tablet 1   Calcium  Carb-Cholecalciferol (CALCIUM  600 + D PO) Take 1 tablet by mouth daily.     calcium  carbonate (TUMS - DOSED  IN MG ELEMENTAL CALCIUM ) 500 MG chewable tablet Chew 1,000 mg by mouth daily as needed for indigestion or heartburn.     Carboxymethylcellul-Glycerin (LUBRICATING EYE DROPS OP) Place 1 drop into both eyes daily as needed (irritated eyes).     dapagliflozin  propanediol (FARXIGA ) 10 MG TABS tablet Take 1 tablet (10 mg total) by mouth daily. 90 tablet 3   escitalopram  (LEXAPRO ) 10 MG tablet TAKE 1 TABLET(10 MG) BY MOUTH DAILY 90 tablet 3   ezetimibe  (ZETIA ) 10 MG tablet TAKE 1 TABLET(10 MG) BY MOUTH DAILY 90 tablet 1   ferrous sulfate  325 (65 FE) MG tablet Take 1 tablet (325 mg total) by mouth daily.     hydrochlorothiazide  (MICROZIDE ) 12.5 MG capsule TAKE 1 CAPSULE(12.5 MG) BY MOUTH DAILY 90 capsule 3   losartan  (COZAAR ) 100 MG tablet TAKE 1 TABLET(100 MG) BY MOUTH DAILY 90 tablet 3   Multiple Vitamins-Minerals (EYE VITAMINS PO) Take 1 tablet by mouth daily.      rosuvastatin  (CRESTOR ) 40 MG tablet TAKE 1 TABLET BY MOUTH EVERY DAY FOR CHOLESTEROL 90 tablet 3   No current facility-administered medications for this visit.     Review of Systems Full ROS  was asked and was negative except for the information on the HPI  Physical Exam Blood pressure 139/77, pulse 86, temperature 97.7 F (36.5 C), temperature source Oral, height 5' 2 (1.575 m), weight 149 lb 6.4 oz (67.8 kg), SpO2 96%. CONSTITUTIONAL: NAD. EYES: Pupils are equal, round, Sclera are non-icteric. EARS, NOSE, MOUTH AND THROAT: The oropharynx is clear. The oral mucosa is pink and moist. Hearing is intact to voice. LYMPH NODES:  Lymph nodes in the neck are normal. RESPIRATORY:  Lungs are clear. There is normal respiratory effort, with equal breath sounds bilaterally, and without pathologic use of accessory muscles. CARDIOVASCULAR: Heart is regular without murmurs, gallops, or rubs. BREAST: Right chest wall prior evidence of mastectomy scar. No lymphedema or chest wall lesions. Left side Prior lumpectomy located at 11:00 and radiation  changes, Difficult to distinguish a discrete mass from surgical and radiation changes. Skin and nipple intact, no LAD. GI: The abdomen is  soft, nontender, and nondistended. There are no palpable masses. There is no hepatosplenomegaly. There are normal bowel sounds in all quadrants. GU: Rectal deferred.   MUSCULOSKELETAL: Normal muscle strength and tone. No cyanosis or edema.   SKIN: Turgor is good and there are no pathologic skin lesions or ulcers. NEUROLOGIC: Motor and sensation is grossly normal. Cranial nerves are grossly intact. PSYCH:  Oriented to person, place and time. Affect is normal.  Data Reviewed  I have personally reviewed the patient's imaging, laboratory findings and medical records.    Assessment/Plan 83 year old female with prior history of breast cancer bilaterally now with new breast cancer on the left side in the prior breast that had lumpectomy and radiation therapy.  Discussed with the patient in detail about her disease process.  Given the prior radiation I do recommend mastectomy.  She is in full agreement.  I do think that we can proceed with mastectomy formed.  Will discuss with Dr. Reviewed to make sure she agrees with plan.  Tentatively we will plan for simple mastectomy of the left side with sentinel lymph node biopsy.  Procedure discussed with her and her husband in detail.  The risk benefits and the possible medications including but not limited to: Bleeding, infection, seroma, edema, flap necrosis for healing.  This is particularly pertinent to her given that she had prior radiation and also prior surgery at bedside.  She fully understands. I personally spent a total of 60 minutes in the care of the patient today including performing a medically appropriate exam/evaluation, counseling and educating, placing orders, referring and communicating with other health care professionals, documenting clinical information in the EHR, independently interpreting and reviewing images  studies and coordinating care.    Laneta Luna, MD FACS General Surgeon 12/04/2023, 3:51 PM

## 2023-12-05 ENCOUNTER — Telehealth: Payer: Self-pay | Admitting: Oncology

## 2023-12-05 ENCOUNTER — Encounter: Payer: Self-pay | Admitting: Family

## 2023-12-05 ENCOUNTER — Telehealth: Payer: Self-pay | Admitting: Surgery

## 2023-12-05 ENCOUNTER — Other Ambulatory Visit: Payer: Self-pay | Admitting: Surgery

## 2023-12-05 ENCOUNTER — Ambulatory Visit (INDEPENDENT_AMBULATORY_CARE_PROVIDER_SITE_OTHER): Admitting: Family

## 2023-12-05 ENCOUNTER — Encounter: Payer: Self-pay | Admitting: *Deleted

## 2023-12-05 VITALS — BP 136/60 | HR 84 | Temp 98.7°F | Ht 62.0 in | Wt 148.2 lb

## 2023-12-05 DIAGNOSIS — E119 Type 2 diabetes mellitus without complications: Secondary | ICD-10-CM | POA: Diagnosis not present

## 2023-12-05 DIAGNOSIS — I10 Essential (primary) hypertension: Secondary | ICD-10-CM

## 2023-12-05 DIAGNOSIS — D0512 Intraductal carcinoma in situ of left breast: Secondary | ICD-10-CM

## 2023-12-05 DIAGNOSIS — C50912 Malignant neoplasm of unspecified site of left female breast: Secondary | ICD-10-CM

## 2023-12-05 DIAGNOSIS — Z7984 Long term (current) use of oral hypoglycemic drugs: Secondary | ICD-10-CM

## 2023-12-05 DIAGNOSIS — R7309 Other abnormal glucose: Secondary | ICD-10-CM

## 2023-12-05 LAB — POCT GLYCOSYLATED HEMOGLOBIN (HGB A1C): HbA1c POC (<> result, manual entry): 6 % (ref 4.0–5.6)

## 2023-12-05 NOTE — Assessment & Plan Note (Signed)
 Lab Results  Component Value Date   HGBA1C 6.0 12/05/2023    A1c is well-controlled.  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  Continue losartan  100 mg for renal protection

## 2023-12-05 NOTE — Assessment & Plan Note (Signed)
 Recurrent left breast cancer.  Planning mastectomy. will follow.

## 2023-12-05 NOTE — Progress Notes (Signed)
 Pt scheduled for breast surgery on 11/13. Pt will follow up with Dr. Babara approximately 2 weeks after surgery. Scheduling message sent who will contact pt with appt.

## 2023-12-05 NOTE — Telephone Encounter (Signed)
 Per inbasket see Dr. Babara the week of 12/1 to follow up after breast surgery.   I called and left a vm for pt with the date and time of MD appt.   I provided scheduling phone number for pt to call back if this appt does not work for her.

## 2023-12-05 NOTE — Telephone Encounter (Signed)
 Patient has been advised of Pre-Admission date/time, and Surgery date at [facility].  Surgery Date: 12/14/23, arrival time no later than 9:00 am at [facility].  Preadmission Testing Date: 12/08/23 (phone 1p-4p)  Patient informed of the scheduling process and surgery information given at time of office visit.  Patient needs to arrive no later than 9:00 am on the date of her surgery as will be having [facility] bx done first prior to her surgery.

## 2023-12-05 NOTE — Progress Notes (Signed)
 Assessment & Plan:  Essential hypertension Assessment & Plan: Chronic, stable.  Continue  hydrochlorothiazide  12.5 mg qd, losartan  100 mg qd   Elevated glucose -     POCT glycosylated hemoglobin (Hb A1C)  Diabetes mellitus without complication [facility]) Assessment & Plan: Lab Results  Component Value Date   HGBA1C 6.0 12/05/2023    A1c is well-controlled.  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  Continue losartan  100 mg for renal protection   Malignant neoplasm of left female breast, unspecified estrogen receptor status, unspecified site of breast [facility]) Assessment & Plan: Recurrent left breast cancer.  Planning mastectomy. will follow.      Return precautions given.   Risks, benefits, and alternatives of the medications and treatment plan prescribed today were discussed, and patient expressed understanding.   Education regarding symptom management and diagnosis given to patient on AVS either electronically or printed.  Return in about 3 months (around 03/06/2024).  [name], FNP  Subjective:    Patient ID: Tracy Lawrence, female    DOB: 1940/02/02, 83 y.o.   MRN: [id number]  CC: Tracy Lawrence is a 83 y.o. female who presents today for follow up.   HPI: HPI Accompanied husband Overall feels well today.  No new complaints.  She feels that she is processing recent new diagnosis of breast cancer overall well.  She has support of her family. Denies chest pain, shortness of breath Follow up yesterday with general surgery Dr Jordis and planning left mastectomy 12/14/23 History of left breast cancer diagnosed 2011; history of right breast cancer 2002 Allergies: Patient has no known allergies. Current Outpatient Medications on File Prior to Visit  Medication Sig Dispense Refill   acetaminophen  (TYLENOL ) 500 MG tablet Take 1,000 mg by mouth every 6 (six) hours as needed for moderate pain or headache.     allopurinol  (ZYLOPRIM ) 100 MG tablet  TAKE 1 TABLET(100 MG) BY MOUTH DAILY 90 tablet 3   amLODipine (NORVASC) 2.5 MG tablet Take 2.5 mg by mouth daily.     anastrozole  (ARIMIDEX ) 1 MG tablet Take 1 tablet (1 mg total) by mouth daily. 90 tablet 1   Calcium  Carb-Cholecalciferol (CALCIUM  600 + D PO) Take 1 tablet by mouth daily.     calcium  carbonate (TUMS - DOSED IN MG ELEMENTAL CALCIUM ) 500 MG chewable tablet Chew 1,000 mg by mouth daily as needed for indigestion or heartburn.     Carboxymethylcellul-Glycerin (LUBRICATING EYE DROPS OP) Place 1 drop into both eyes daily as needed (irritated eyes).     dapagliflozin  propanediol (FARXIGA ) 10 MG TABS tablet Take 1 tablet (10 mg total) by mouth daily. 90 tablet 3   escitalopram  (LEXAPRO ) 10 MG tablet TAKE 1 TABLET(10 MG) BY MOUTH DAILY 90 tablet 3   ezetimibe  (ZETIA ) 10 MG tablet TAKE 1 TABLET(10 MG) BY MOUTH DAILY 90 tablet 1   ferrous sulfate  325 (65 FE) MG tablet Take 1 tablet (325 mg total) by mouth daily.     hydrochlorothiazide  (MICROZIDE ) 12.5 MG capsule TAKE 1 CAPSULE(12.5 MG) BY MOUTH DAILY 90 capsule 3   lidocaine -prilocaine (EMLA) cream Apply to L/R areola of the breast and cover with plastic wrap one hour prior to leaving for surgery. 5 g 0   losartan  (COZAAR ) 100 MG tablet TAKE 1 TABLET(100 MG) BY MOUTH DAILY 90 tablet 3   Multiple Vitamins-Minerals (EYE VITAMINS PO) Take 1 tablet by mouth daily.      rosuvastatin  (CRESTOR ) 40 MG tablet TAKE 1 TABLET BY MOUTH  EVERY DAY FOR CHOLESTEROL 90 tablet 3   No current facility-administered medications on file prior to visit.    Review of Systems  Constitutional:  Negative for chills and fever.  Respiratory:  Negative for cough.   Cardiovascular:  Negative for chest pain and palpitations.  Gastrointestinal:  Negative for nausea and vomiting.      Objective:    BP 136/60   Pulse 84   Temp 98.7 F (37.1 C) (Oral)   Ht 5' 2 (1.575 m)   Wt 148 lb 3.2 oz (67.2 kg)   LMP  (LMP Unknown)   SpO2 98%   BMI 27.11 kg/m  BP  Readings from Last 3 Encounters:  12/05/23 136/60  12/04/23 139/77  11/15/23 (!) 142/62   Wt Readings from Last 3 Encounters:  12/05/23 148 lb 3.2 oz (67.2 kg)  12/04/23 149 lb 6.4 oz (67.8 kg)  11/15/23 151 lb 12.8 oz (68.9 kg)    Physical Exam Vitals reviewed.  Constitutional:      Appearance: She is well-developed.  Eyes:     Conjunctiva/sclera: Conjunctivae normal.  Cardiovascular:     Rate and Rhythm: Normal rate and regular rhythm.     Pulses: Normal pulses.     Heart sounds: Normal heart sounds.  Pulmonary:     Effort: Pulmonary effort is normal.     Breath sounds: Normal breath sounds. No wheezing, rhonchi or rales.  Skin:    General: Skin is warm and dry.  Neurological:     Mental Status: She is alert.  Psychiatric:        Speech: Speech normal.        Behavior: Behavior normal.        Thought Content: Thought content normal.

## 2023-12-05 NOTE — Assessment & Plan Note (Signed)
Chronic, stable.  Continue  hydrochlorothiazide 12.5 mg qd, losartan 100 mg qd

## 2023-12-06 NOTE — Telephone Encounter (Signed)
 Called patient again, she is now informed of her surgery.

## 2023-12-08 ENCOUNTER — Other Ambulatory Visit: Payer: Self-pay

## 2023-12-08 ENCOUNTER — Encounter
Admission: RE | Admit: 2023-12-08 | Discharge: 2023-12-08 | Disposition: A | Discharge status: Home / Self Care | Source: Ambulatory Visit | Attending: Surgery | Admitting: Surgery

## 2023-12-08 ENCOUNTER — Telehealth: Payer: Self-pay

## 2023-12-08 DIAGNOSIS — I10 Essential (primary) hypertension: Secondary | ICD-10-CM

## 2023-12-08 DIAGNOSIS — E119 Type 2 diabetes mellitus without complications: Secondary | ICD-10-CM

## 2023-12-08 DIAGNOSIS — Z01812 Encounter for preprocedural laboratory examination: Secondary | ICD-10-CM

## 2023-12-08 HISTORY — DX: Chronic kidney disease, unspecified: N18.9

## 2023-12-08 NOTE — Patient Instructions (Addendum)
 Your procedure is scheduled on: 12/14/23 Thursday Report to the Registration Desk on the 1st floor of the [facility]. To find out your arrival time, please call [phone] between 1PM - 3PM on: 12/13/23 - Wednesday If your arrival time is 6:00 am, do not arrive before that time as the [facility] entrance doors do not open until 6:00 am.  REMEMBER: Instructions that are not followed completely may result in serious medical risk, up to and including death; or upon the discretion of your surgeon and anesthesiologist your surgery may need to be rescheduled.  Do not eat food after midnight the night before surgery.  No gum chewing or hard candies.  You may however, drink CLEAR liquids up to 2 hours before you are scheduled to arrive for your surgery. Do not drink anything within 2 hours of your scheduled arrival time.  Clear liquids include: - water   One week prior to surgery: Stop Anti-inflammatories (NSAIDS) such as Advil, Aleve, Ibuprofen, Motrin, Naproxen, Naprosyn and Aspirin based products such as Excedrin, Goody's Powder, BC Powder. You may continue to take Tylenol  if needed for pain up until the day of surgery.  Stop ANY OVER THE COUNTER supplements until after surgery : CALCIUM  600 + D ,Multiple Vitamins-Minerals   FARXIGA  - hold beginning 12/11/23   ON THE DAY OF SURGERY ONLY TAKE THESE MEDICATIONS WITH SIPS OF WATER:  escitalopram  (LEXAPRO )  anastrozole  (ARIMIDEX )  amLODipine (NORVASC)   No Alcohol for 24 hours before or after surgery.  No Smoking including e-cigarettes for 24 hours before surgery.  No chewable tobacco products for at least 6 hours before surgery.  No nicotine patches on the day of surgery.  Do not use any recreational drugs for at least a week (preferably 2 weeks) before your surgery.  Please be advised that the combination of cocaine and anesthesia may have negative outcomes, up to and including death. If you test positive for cocaine, your  surgery will be cancelled.  On the morning of surgery brush your teeth with toothpaste and water, you may rinse your mouth with mouthwash if you wish. Do not swallow any toothpaste or mouthwash.  Use CHG Soap or wipes as directed on instruction sheet.  Do not wear jewelry, make-up, hairpins, clips or nail polish.  For welded (permanent) jewelry: bracelets, anklets, waist bands, etc.  Please have this removed prior to surgery.  If it is not removed, there is a chance that hospital personnel will need to cut it off on the day of surgery.  Do not wear lotions, powders, or perfumes.   Do not shave body hair from the neck down 48 hours before surgery.  Contact lenses, hearing aids and dentures may not be worn into surgery.  Do not bring valuables to the hospital. [facility] is not responsible for any missing/lost belongings or valuables.   Notify your doctor if there is any change in your medical condition (cold, fever, infection).  Wear comfortable clothing (specific to your surgery type) to the hospital.  After surgery, you can help prevent lung complications by doing breathing exercises.  Take deep breaths and cough every 1-2 hours. Your doctor may order a device called an Incentive Spirometer to help you take deep breaths.  When coughing or sneezing, hold a pillow firmly against your incision with both hands. This is called "splinting." Doing this helps protect your incision. It also decreases belly discomfort.  If you are being admitted to the hospital overnight, leave your suitcase in the  car. After surgery it may be brought to your room.  In case of increased patient census, it may be necessary for you, the patient, to continue your postoperative care in the Same Day Surgery department.  If you are being discharged the day of surgery, you will not be allowed to drive home. You will need a responsible individual to drive you home and stay with you for 24 hours after surgery.   If  you are taking public transportation, you will need to have a responsible individual with you.  Please call the Pre-admissions Testing Dept. at [phone] if you have any questions about these instructions.  Surgery Visitation Policy:  Patients having surgery or a procedure may have two visitors.  Children under the age of 72 must have an adult with them who is not the patient.  Inpatient Visitation:    Visiting hours are 7 a.m. to 8 p.m. Up to four visitors are allowed at one time in a patient room. The visitors may rotate out with other people during the day.  One visitor age 52 or older may stay with the patient overnight and must be in the room by 8 p.m.   Merchandiser, Retail to address health-related social needs:  [email or web address]                                                                                                            Preparing for Surgery with CHLORHEXIDINE  GLUCONATE (CHG) Soap  Chlorhexidine  Gluconate (CHG) Soap  o An antiseptic cleaner that kills germs and bonds with the skin to continue killing germs even after washing  o Used for showering the night before surgery and morning of surgery  Before surgery, you can play an important role by reducing the number of germs on your skin.  CHG (Chlorhexidine  gluconate) soap is an antiseptic cleanser which kills germs and bonds with the skin to continue killing germs even after washing.  Please do not use if you have an allergy to CHG or antibacterial soaps. If your skin becomes reddened/irritated stop using the CHG.  1. Shower the NIGHT BEFORE SURGERY with CHG soap.  2. If you choose to wash your hair, wash your hair first as usual with your normal shampoo.  3. After shampooing, rinse your hair and body thoroughly to remove the shampoo.  4. Use CHG as you would any other liquid soap. You can apply CHG directly to the skin and wash gently with a clean washcloth.  5. Apply the CHG  soap to your body only from the neck down. Do not use on open wounds or open sores. Avoid contact with your eyes, ears, mouth, and genitals (private parts). Wash face and genitals (private parts) with your normal soap.  6. Wash thoroughly, paying special attention to the area where your surgery will be performed.  7. Thoroughly rinse your body with warm water.  8. Do not shower/wash with your normal soap after using and rinsing off the CHG soap.  9. Do not use lotions, oils, etc., after showering  with CHG.  10. Pat yourself dry with a clean towel.  11. Wear clean pajamas to bed the night before surgery.  12. Place clean sheets on your bed the night of your shower and do not sleep with pets.  13. Do not apply any deodorants/lotions/powders.  14. Please wear clean clothes to the hospital.  15. Remember to brush your teeth with your regular toothpaste.

## 2023-12-08 NOTE — Telephone Encounter (Signed)
 Copied from CRM #[id number]. Topic: Clinical - Medical Advice >> Dec 08, 2023  9:25 AM Tracy Lawrence wrote: Reason for CRM: Patient having procedure on 11/13 - she is inquiring if she needs antibiotics for her: coughing and drainage before her procedure?  Pls call patient

## 2023-12-11 ENCOUNTER — Encounter
Admission: RE | Admit: 2023-12-11 | Discharge: 2023-12-11 | Disposition: A | Discharge status: Home / Self Care | Source: Ambulatory Visit | Attending: Surgery | Admitting: Surgery

## 2023-12-11 DIAGNOSIS — E119 Type 2 diabetes mellitus without complications: Secondary | ICD-10-CM | POA: Diagnosis not present

## 2023-12-11 DIAGNOSIS — I10 Essential (primary) hypertension: Secondary | ICD-10-CM | POA: Diagnosis not present

## 2023-12-11 DIAGNOSIS — Z01810 Encounter for preprocedural cardiovascular examination: Secondary | ICD-10-CM | POA: Insufficient documentation

## 2023-12-13 ENCOUNTER — Ambulatory Visit: Admitting: Family

## 2023-12-13 ENCOUNTER — Telehealth: Payer: Self-pay | Admitting: Family

## 2023-12-13 DIAGNOSIS — E119 Type 2 diabetes mellitus without complications: Secondary | ICD-10-CM

## 2023-12-13 DIAGNOSIS — E785 Hyperlipidemia, unspecified: Secondary | ICD-10-CM

## 2023-12-13 NOTE — Telephone Encounter (Signed)
 Pt unable to do Virtual visit. She complains clear runny nasal discharge 5 days, improving.  Endorses postnasal drip.  Episodic dry cough.  Denies fever, shortness of breath, wheezing, chills, ear or facial pain, shortness chest pain.  She has been using Benadryl with relief.  Tomorrow she is planning on mastectomy  H/o CKD  Plan: In absence of fevers, chills or alarm features and improvement of symptoms, patient jointly agreed more likely viral or allergic in etiology.  Advised her to hold Benadryl and not to take tomorrow due to surgery.  Advise she may start azelastine over-the-counter needed today.  We agreed to defer antibiotics at this time.  She will let me know how she is doing.

## 2023-12-14 ENCOUNTER — Encounter: Payer: Self-pay | Admitting: Surgery

## 2023-12-14 ENCOUNTER — Encounter
Admission: RE | Admit: 2023-12-14 | Discharge: 2023-12-14 | Disposition: A | Discharge status: Home / Self Care | Source: Ambulatory Visit | Attending: Surgery | Admitting: Surgery

## 2023-12-14 ENCOUNTER — Ambulatory Visit: Admitting: Anesthesiology

## 2023-12-14 ENCOUNTER — Observation Stay
Admission: RE | Admit: 2023-12-14 | Discharge: 2023-12-15 | Disposition: A | Discharge status: Home / Self Care | Attending: Surgery | Admitting: Surgery

## 2023-12-14 ENCOUNTER — Encounter: Payer: Self-pay | Admitting: Urgent Care

## 2023-12-14 ENCOUNTER — Encounter: Admission: RE | Disposition: A | Payer: Self-pay | Source: Home / Self Care | Attending: Surgery

## 2023-12-14 ENCOUNTER — Other Ambulatory Visit: Payer: Self-pay

## 2023-12-14 ENCOUNTER — Ambulatory Visit
Admission: RE | Admit: 2023-12-14 | Discharge: 2023-12-14 | Disposition: A | Source: Ambulatory Visit | Attending: Surgery

## 2023-12-14 DIAGNOSIS — E119 Type 2 diabetes mellitus without complications: Secondary | ICD-10-CM | POA: Diagnosis not present

## 2023-12-14 DIAGNOSIS — C50919 Malignant neoplasm of unspecified site of unspecified female breast: Principal | ICD-10-CM | POA: Diagnosis present

## 2023-12-14 DIAGNOSIS — Z1231 Encounter for screening mammogram for malignant neoplasm of breast: Secondary | ICD-10-CM | POA: Insufficient documentation

## 2023-12-14 DIAGNOSIS — Z79899 Other long term (current) drug therapy: Secondary | ICD-10-CM | POA: Insufficient documentation

## 2023-12-14 DIAGNOSIS — D0512 Intraductal carcinoma in situ of left breast: Principal | ICD-10-CM

## 2023-12-14 DIAGNOSIS — Z87891 Personal history of nicotine dependence: Secondary | ICD-10-CM | POA: Diagnosis not present

## 2023-12-14 DIAGNOSIS — I10 Essential (primary) hypertension: Secondary | ICD-10-CM | POA: Diagnosis not present

## 2023-12-14 DIAGNOSIS — Z853 Personal history of malignant neoplasm of breast: Secondary | ICD-10-CM | POA: Insufficient documentation

## 2023-12-14 HISTORY — PX: TOTAL MASTECTOMY: SHX6129

## 2023-12-14 HISTORY — PX: AXILLARY SENTINEL NODE BIOPSY: SHX5738

## 2023-12-14 LAB — GLUCOSE, CAPILLARY
Glucose-Capillary: 229 mg/dL — ABNORMAL HIGH (ref 70–99)
Glucose-Capillary: 180 mg/dL — ABNORMAL HIGH (ref 70–99)

## 2023-12-14 SURGERY — MASTECTOMY, SIMPLE
Anesthesia: General | Site: Breast | Laterality: Left

## 2023-12-14 MED ORDER — LACTATED RINGERS IV SOLN: INTRAVENOUS | Status: AC

## 2023-12-14 MED ORDER — BUPIVACAINE-EPINEPHRINE (PF) 0.25% -1:200000 IJ SOLN
INTRAMUSCULAR | Status: DC | PRN
  Administered 2023-12-14: 30 mL

## 2023-12-14 MED ORDER — FENTANYL CITRATE (PF) 100 MCG/2ML IJ SOLN
INTRAMUSCULAR | Status: DC | PRN
  Administered 2023-12-14 (×4): 25 ug via INTRAVENOUS

## 2023-12-14 MED ORDER — OXYCODONE HCL 5 MG PO TABS
ORAL_TABLET | ORAL | Status: AC
  Filled 2023-12-14: qty 1

## 2023-12-14 MED ORDER — OXYCODONE HCL 5 MG PO TABS
5.0000 mg | ORAL_TABLET | ORAL | Status: DC | PRN
  Filled 2023-12-14: qty 1

## 2023-12-14 MED ORDER — ACETAMINOPHEN 500 MG PO TABS
ORAL_TABLET | ORAL | Status: AC
  Filled 2023-12-14: qty 2

## 2023-12-14 MED ORDER — HYDRALAZINE HCL 20 MG/ML IJ SOLN: 10.0000 mg | INTRAMUSCULAR | Status: DC | PRN

## 2023-12-14 MED ORDER — CHLORHEXIDINE GLUCONATE CLOTH 2 % EX PADS
6.0000 | MEDICATED_PAD | Freq: Once | CUTANEOUS | Status: AC
  Administered 2023-12-14: 6 via TOPICAL

## 2023-12-14 MED ORDER — CEFAZOLIN SODIUM-DEXTROSE 2-4 GM/100ML-% IV SOLN
2.0000 g | Freq: Three times a day (TID) | INTRAVENOUS | Status: AC
  Administered 2023-12-14 – 2023-12-15 (×2): 2 g via INTRAVENOUS
  Filled 2023-12-14 (×2): qty 100

## 2023-12-14 MED ORDER — CHLORHEXIDINE GLUCONATE CLOTH 2 % EX PADS: 6.0000 | MEDICATED_PAD | Freq: Once | CUTANEOUS | Status: DC

## 2023-12-14 MED ORDER — OXYCODONE HCL 5 MG/5ML PO SOLN: 5.0000 mg | Freq: Once | ORAL | Status: AC | PRN

## 2023-12-14 MED ORDER — CHLORHEXIDINE GLUCONATE 0.12 % MT SOLN
OROMUCOSAL | Status: AC
Start: 2023-12-14 — End: 2023-12-14
  Filled 2023-12-14: qty 15

## 2023-12-14 MED ORDER — ISOSULFAN BLUE 1 % ~~LOC~~ SOLN
SUBCUTANEOUS | Status: AC
  Filled 2023-12-14: qty 5

## 2023-12-14 MED ORDER — DEXAMETHASONE SOD PHOSPHATE PF 10 MG/ML IJ SOLN
INTRAMUSCULAR | Status: DC | PRN
  Administered 2023-12-14: 10 mg via INTRAVENOUS

## 2023-12-14 MED ORDER — INSULIN ASPART 100 UNIT/ML IJ SOLN
4.0000 [IU] | Freq: Three times a day (TID) | INTRAMUSCULAR | Status: DC
  Administered 2023-12-14 – 2023-12-15 (×3): 4 [IU] via SUBCUTANEOUS
  Filled 2023-12-14 (×3): qty 4

## 2023-12-14 MED ORDER — CEFAZOLIN SODIUM-DEXTROSE 2-4 GM/100ML-% IV SOLN
2.0000 g | INTRAVENOUS | Status: AC
  Administered 2023-12-14: 2 g via INTRAVENOUS

## 2023-12-14 MED ORDER — ISOSULFAN BLUE 1 % ~~LOC~~ SOLN
SUBCUTANEOUS | Status: DC | PRN
  Administered 2023-12-14: 5 mg via SUBCUTANEOUS

## 2023-12-14 MED ORDER — EPHEDRINE 5 MG/ML INJ
INTRAVENOUS | Status: AC
  Filled 2023-12-14: qty 10

## 2023-12-14 MED ORDER — BUPIVACAINE-EPINEPHRINE (PF) 0.25% -1:200000 IJ SOLN
INTRAMUSCULAR | Status: AC
  Filled 2023-12-14: qty 30

## 2023-12-14 MED ORDER — INSULIN ASPART 100 UNIT/ML IJ SOLN
0.0000 [IU] | Freq: Three times a day (TID) | INTRAMUSCULAR | Status: DC
  Administered 2023-12-14: 5 [IU] via SUBCUTANEOUS
  Administered 2023-12-15: 2 [IU] via SUBCUTANEOUS
  Filled 2023-12-14: qty 2
  Filled 2023-12-14: qty 5

## 2023-12-14 MED ORDER — TECHNETIUM TC 99M TILMANOCEPT KIT
1.0700 | PACK | Freq: Once | INTRAVENOUS | Status: AC | PRN
  Administered 2023-12-14: 1.07 via INTRADERMAL

## 2023-12-14 MED ORDER — ACETAMINOPHEN 500 MG PO TABS
1000.0000 mg | ORAL_TABLET | ORAL | Status: AC
  Administered 2023-12-14: 1000 mg via ORAL

## 2023-12-14 MED ORDER — DIPHENHYDRAMINE HCL 50 MG/ML IJ SOLN: 12.5000 mg | Freq: Four times a day (QID) | INTRAMUSCULAR | Status: DC | PRN

## 2023-12-14 MED ORDER — DIPHENHYDRAMINE HCL 12.5 MG/5ML PO ELIX: 12.5000 mg | ORAL_SOLUTION | Freq: Four times a day (QID) | ORAL | Status: DC | PRN

## 2023-12-14 MED ORDER — ONDANSETRON HCL 4 MG/2ML IJ SOLN: 4.0000 mg | Freq: Four times a day (QID) | INTRAMUSCULAR | Status: DC | PRN

## 2023-12-14 MED ORDER — CEFAZOLIN SODIUM-DEXTROSE 2-4 GM/100ML-% IV SOLN
INTRAVENOUS | Status: AC
  Filled 2023-12-14: qty 100

## 2023-12-14 MED ORDER — CHLORHEXIDINE GLUCONATE 0.12 % MT SOLN
15.0000 mL | Freq: Once | OROMUCOSAL | Status: AC
  Administered 2023-12-14: 15 mL via OROMUCOSAL

## 2023-12-14 MED ORDER — CELECOXIB 200 MG PO CAPS
ORAL_CAPSULE | ORAL | Status: AC
  Filled 2023-12-14: qty 1

## 2023-12-14 MED ORDER — EPHEDRINE SULFATE-NACL 50-0.9 MG/10ML-% IV SOSY
PREFILLED_SYRINGE | INTRAVENOUS | Status: DC | PRN
  Administered 2023-12-14 (×3): 10 mg via INTRAVENOUS

## 2023-12-14 MED ORDER — ENOXAPARIN SODIUM 40 MG/0.4ML IJ SOSY
40.0000 mg | PREFILLED_SYRINGE | INTRAMUSCULAR | Status: DC
  Administered 2023-12-15: 40 mg via SUBCUTANEOUS
  Filled 2023-12-14: qty 0.4

## 2023-12-14 MED ORDER — BUPIVACAINE LIPOSOME 1.3 % IJ SUSP
INTRAMUSCULAR | Status: AC
  Filled 2023-12-14: qty 20

## 2023-12-14 MED ORDER — MELATONIN 5 MG PO TABS
5.0000 mg | ORAL_TABLET | Freq: Every evening | ORAL | Status: DC | PRN
  Filled 2023-12-14: qty 1

## 2023-12-14 MED ORDER — MORPHINE SULFATE (PF) 2 MG/ML IV SOLN: 2.0000 mg | INTRAVENOUS | Status: DC | PRN

## 2023-12-14 MED ORDER — ONDANSETRON 4 MG PO TBDP: 4.0000 mg | ORAL_TABLET | Freq: Four times a day (QID) | ORAL | Status: DC | PRN

## 2023-12-14 MED ORDER — ONDANSETRON HCL 4 MG/2ML IJ SOLN
INTRAMUSCULAR | Status: AC
  Filled 2023-12-14: qty 2

## 2023-12-14 MED ORDER — ACETAMINOPHEN 500 MG PO TABS
1000.0000 mg | ORAL_TABLET | Freq: Four times a day (QID) | ORAL | Status: DC
  Administered 2023-12-14 – 2023-12-15 (×4): 1000 mg via ORAL
  Filled 2023-12-14 (×4): qty 2

## 2023-12-14 MED ORDER — PROPOFOL 10 MG/ML IV BOLUS
INTRAVENOUS | Status: DC | PRN
  Administered 2023-12-14: 120 mg via INTRAVENOUS

## 2023-12-14 MED ORDER — ORAL CARE MOUTH RINSE: 15.0000 mL | Freq: Once | OROMUCOSAL | Status: AC

## 2023-12-14 MED ORDER — FENTANYL CITRATE (PF) 100 MCG/2ML IJ SOLN
INTRAMUSCULAR | Status: AC
  Filled 2023-12-14: qty 2

## 2023-12-14 MED ORDER — OXYCODONE HCL 5 MG PO TABS
5.0000 mg | ORAL_TABLET | Freq: Once | ORAL | Status: AC | PRN
  Administered 2023-12-14: 5 mg via ORAL

## 2023-12-14 MED ORDER — ONDANSETRON HCL 4 MG/2ML IJ SOLN: 4.0000 mg | Freq: Once | INTRAMUSCULAR | Status: DC | PRN

## 2023-12-14 MED ORDER — SODIUM CHLORIDE 0.9 % IV SOLN: INTRAVENOUS | Status: DC

## 2023-12-14 MED ORDER — BUPIVACAINE LIPOSOME 1.3 % IJ SUSP
INTRAMUSCULAR | Status: DC | PRN
  Administered 2023-12-14: 20 mL

## 2023-12-14 MED ORDER — SEVOFLURANE IN SOLN
RESPIRATORY_TRACT | Status: AC
  Filled 2023-12-14: qty 250

## 2023-12-14 MED ORDER — MELATONIN 3 MG PO TABS: 3.0000 mg | ORAL_TABLET | Freq: Every evening | ORAL | Status: DC | PRN

## 2023-12-14 MED ORDER — LIDOCAINE HCL (CARDIAC) PF 100 MG/5ML IV SOSY
PREFILLED_SYRINGE | INTRAVENOUS | Status: DC | PRN
  Administered 2023-12-14: 80 mg via INTRAVENOUS

## 2023-12-14 MED ORDER — FENTANYL CITRATE (PF) 100 MCG/2ML IJ SOLN
25.0000 ug | INTRAMUSCULAR | Status: DC | PRN
  Administered 2023-12-14 (×4): 25 ug via INTRAVENOUS

## 2023-12-14 MED ORDER — KETOROLAC TROMETHAMINE 15 MG/ML IJ SOLN: 15.0000 mg | Freq: Four times a day (QID) | INTRAMUSCULAR | Status: DC | PRN

## 2023-12-14 MED ORDER — PANTOPRAZOLE SODIUM 40 MG IV SOLR
40.0000 mg | Freq: Two times a day (BID) | INTRAVENOUS | Status: DC
  Administered 2023-12-14 – 2023-12-15 (×2): 40 mg via INTRAVENOUS
  Filled 2023-12-14 (×2): qty 10

## 2023-12-14 MED ORDER — LOSARTAN POTASSIUM 50 MG PO TABS
50.0000 mg | ORAL_TABLET | Freq: Two times a day (BID) | ORAL | Status: DC
  Administered 2023-12-15: 50 mg via ORAL
  Filled 2023-12-14: qty 1

## 2023-12-14 MED ORDER — PROPOFOL 10 MG/ML IV BOLUS
INTRAVENOUS | Status: AC
  Filled 2023-12-14: qty 20

## 2023-12-14 MED ORDER — CELECOXIB 200 MG PO CAPS
200.0000 mg | ORAL_CAPSULE | ORAL | Status: AC
  Administered 2023-12-14: 200 mg via ORAL

## 2023-12-14 MED ORDER — STERILE WATER FOR IRRIGATION IR SOLN
Status: DC | PRN
  Administered 2023-12-14: 200 mL

## 2023-12-14 MED ORDER — ONDANSETRON HCL 4 MG/2ML IJ SOLN
INTRAMUSCULAR | Status: DC | PRN
  Administered 2023-12-14: 4 mg via INTRAVENOUS

## 2023-12-14 MED ORDER — GABAPENTIN 300 MG PO CAPS
ORAL_CAPSULE | ORAL | Status: AC
Start: 2023-12-14 — End: 2023-12-14
  Filled 2023-12-14: qty 1

## 2023-12-14 MED ORDER — GABAPENTIN 300 MG PO CAPS
300.0000 mg | ORAL_CAPSULE | ORAL | Status: AC
  Administered 2023-12-14: 300 mg via ORAL

## 2023-12-14 SURGICAL SUPPLY — 39 items
BINDER BREAST LRG (GAUZE/BANDAGES/DRESSINGS) IMPLANT
BLADE SURG 15 STRL LF DISP TIS (BLADE) ×2 IMPLANT
CHLORAPREP W/TINT 26 (MISCELLANEOUS) ×2 IMPLANT
CLIP APPLIE 11 MED OPEN (CLIP) IMPLANT
CNTNR URN SCR LID CUP LEK RST (MISCELLANEOUS) ×2 IMPLANT
COVER PROBE GAMMA FINDER SLV (MISCELLANEOUS) ×2 IMPLANT
DERMABOND ADVANCED .7 DNX12 (GAUZE/BANDAGES/DRESSINGS) ×4 IMPLANT
DRAIN CHANNEL 19F RND (DRAIN) IMPLANT
DRAIN CHANNEL JP 15F RND 3/16 (MISCELLANEOUS) ×2 IMPLANT
DRAPE LAPAROTOMY TRNSV 106X77 (MISCELLANEOUS) ×2 IMPLANT
DRSG GAUZE FLUFF 36X18 (GAUZE/BANDAGES/DRESSINGS) ×2 IMPLANT
DRSG TEGADERM 4X4.75 (GAUZE/BANDAGES/DRESSINGS) IMPLANT
ELECT CAUTERY BLADE 6.4 (BLADE) ×2 IMPLANT
ELECTRODE BLDE 4.0 EZ CLN MEGD (MISCELLANEOUS) IMPLANT
ELECTRODE REM PT RTRN 9FT ADLT (ELECTROSURGICAL) ×2 IMPLANT
EVACUATOR SILICONE 100CC (DRAIN) ×2 IMPLANT
GLOVE BIO SURGEON STRL SZ7 (GLOVE) ×2 IMPLANT
GOWN STRL REUS W/ TWL LRG LVL3 (GOWN DISPOSABLE) ×4 IMPLANT
KIT MARKER MARGIN INK (KITS) IMPLANT
KIT TURNOVER KIT A (KITS) ×2 IMPLANT
LABEL OR SOLS (LABEL) ×2 IMPLANT
MANIFOLD NEPTUNE II (INSTRUMENTS) ×2 IMPLANT
NDL HYPO 22X1.5 SAFETY MO (MISCELLANEOUS) ×4 IMPLANT
NDL SAFETY ECLIP 18X1.5 (MISCELLANEOUS) ×2 IMPLANT
NEEDLE HYPO 22X1.5 SAFETY MO (MISCELLANEOUS) ×4 IMPLANT
PACK BASIN MAJOR ARMC (MISCELLANEOUS) ×2 IMPLANT
SOLN STERILE WATER BTL 1000 ML (IV SOLUTION) ×2 IMPLANT
SPONGE DRAIN TRACH 4X4 STRL 2S (GAUZE/BANDAGES/DRESSINGS) IMPLANT
SPONGE T-LAP 18X18 ~~LOC~~+RFID (SPONGE) ×2 IMPLANT
SUT ETH BLK MONO 3 0 FS 1 12/B (SUTURE) ×2 IMPLANT
SUT SILK 2 0 SH (SUTURE) ×4 IMPLANT
SUT VIC AB 0 CT1 36 (SUTURE) ×8 IMPLANT
SUT VIC AB 2-0 CT1 (SUTURE) ×4 IMPLANT
SUTURE MNCRL 4-0 27XMF (SUTURE) ×4 IMPLANT
SYR 10ML LL (SYRINGE) ×2 IMPLANT
SYR 30ML LL (SYRINGE) ×4 IMPLANT
SYR 3ML LL SCALE MARK (SYRINGE) ×2 IMPLANT
TRAP FLUID SMOKE EVACUATOR (MISCELLANEOUS) ×2 IMPLANT
WATER STERILE IRR 500ML POUR (IV SOLUTION) ×2 IMPLANT

## 2023-12-14 NOTE — Anesthesia Postprocedure Evaluation (Signed)
 Anesthesia Post Note  Patient: CASANDRA DALLAIRE  Procedure(s) Performed: MASTECTOMY, SIMPLE (Left: Breast) BIOPSY, LYMPH NODE, SENTINEL, AXILLARY (Left: Axilla)  Patient location during evaluation: PACU Anesthesia Type: General Level of consciousness: awake Pain management: pain level controlled Vital Signs Assessment: post-procedure vital signs reviewed and stable Cardiovascular status: stable Anesthetic complications: no   No notable events documented.   Last Vitals:  Vitals:   12/14/23 1404 12/14/23 1415  BP: (!) 140/74 (!) 129/54  Pulse: (!) 109 (!) 110  Resp: 11 12  Temp:    SpO2:  94%    Last Pain:  Vitals:   12/14/23 1419  TempSrc:   PainSc: 6                  VAN STAVEREN,Mikyle Sox

## 2023-12-14 NOTE — Interval H&P Note (Signed)
 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  The various methods of treatment have been discussed with the patient and family. After consideration of risks, benefits and other options for treatment, the patient has consented to  Procedure(s): MASTECTOMY, SIMPLE (Left) BIOPSY, LYMPH NODE, SENTINEL, AXILLARY (Left) as a surgical intervention.  The patient's history has been reviewed, patient examined, no change in status, stable for surgery.  I have reviewed the patient's chart and labs.  Questions were answered to the patient's satisfaction.     [name] F Arlet Marter

## 2023-12-14 NOTE — Op Note (Signed)
 Pre-operative Diagnosis: Left Breast Cancer ( prior hx of breast cancer and radiation therapy on ipsilateral breast)   Post-operative Diagnosis: Same   Surgeon: Laneta Luna,  MD FACS  Anesthesia: GETA  Procedure: Left mastectomy Simple Left axillary sentinel node biopsy Complex closure with tissue rearrangements measuring 64cm2, Angel wing technique  Findings: Prior lumpectomy cavity from prior cancer Significant radiation changes within left breast parenchyma and axilla making the case very difficult due to scaring and altered anatomy Due to extra margin on superior and anterior aspect I mobilized the soft tissue from the chest wall inferiorly and lateral creating a tissue rearrangement   Estimated Blood Loss: 20cc         Drains:19 FR         Specimens: mastectomy with labels, Sentinel node x2       Complications: none                 Condition: Stable   Operation performed with curative intent:Yes   Tracer(s) used to identify sentinel nodes in the upfront surgery (non-neoadjuvant) setting (select all that apply):Dye and Radioactive Tracer   Tracer(s) used to identify sentinel nodes in the neoadjuvant setting (select all that apply):N/A   All nodes (colored or non-colored) present at the end of a dye-filled lymphatic channel were removed:Yes    All significantly radioactive nodes were removed:Yes   All palpable suspicious nodes were removed:Yes   Biopsy-proven positive nodes marked with clips prior to chemotherapy were identified and removed:N/A  Procedure Details  The patient was seen again in the Holding Room. The benefits, complications, treatment options, and expected outcomes were discussed with the patient. The risks of bleeding, infection, recurrence of symptoms, failure to resolve symptoms, hematoma, seroma, open wound, cosmetic deformity, and the need for further surgery were discussed.  The patient was taken to Operating Room, identified  and the procedure  verified.  A Time Out was held and the above information confirmed.  Prior to the induction of general anesthesia, antibiotic prophylaxis was administered. VTE prophylaxis was in place. Appropriate anesthesia was then administered and tolerated well. I used the savi scout probe to localize the area of maximal cadence and Marked the area. Isosulfan blue dye was injected periareolar early under aseptic conditions.  The chest was prepped with Chloraprep and draped in the sterile fashion. The patient was positioned in the supine position.   Using the hand-held probe an area of high counts was identified in the axilla and marked. I started with an elliptical incision for the mastectomy extending more laterally to reach the axilla. Sub tissue was dissected with cautery and using the node seeker we found two separate lymph nodes . I clipped the small lymphatic channels in the standard fashion. A total of 2 nodes that were both hot and blue were excised.  Attention was turned to the left breast where elliptical incision created incorporating the nipple areola complex and oriented in a diagonal fashion. Circumferential flaps performed with cautery and breast tissue was elevated by dividing cooper ligaments. Simple mastectomy with adequate margins was performed using cautery, margins of dissection were the sternum medially, latissimus laterally. Mammary fold inferiorly and clavicle superiorly. Breast tissue was removed from chest wall using cautery. The specimen was label, marked, due to prior lumpectomy for cancer and radiation changes her breast anatomy was distorted, specifically superior to the nipple where prior lumpectomy was there was a cavity fused to the skin, given my concern for potential skin involvements in this area I  resected additional generous superior skin and breast margins. Additional specimen was sent for pathology. Due to significant resection in order to cover soft tissue I needed to do a  tissues rearrangement of the soft tissue by dissected subcutaneous from latissimus and inferiorly from the chest wall (Tissue advancement flaps were performed to decrease the volume deficit in the area of the resection).  Please note that the total void area was 8 x 8 cm equals 64 cm.   The  soft tissue was then reapproximated in a deep to superficial fashion using interrupted 2-0 Vicryl sutures.  Please note that I placed 2 deep layers of 2-0 Vicryl's. 19 blake drain placed in the chest wall and secured to the skin. Dermal sutures done with 2-0 vicryls Once assuring that hemostasis was adequate and checked multiple times the wound was closed with 4-0 subcuticular Monocryl sutures. Dermabond was placed  Patient was taken to the recovery room in stable condition.   Laneta Luna , MD, FACS

## 2023-12-14 NOTE — Transfer of Care (Signed)
 Immediate Anesthesia Transfer of Care Note  Patient: Tracy Lawrence  Procedure(s) Performed: MASTECTOMY, SIMPLE (Left: Breast) BIOPSY, LYMPH NODE, SENTINEL, AXILLARY (Left: Axilla)  Patient Location: PACU  Anesthesia Type:General  Level of Consciousness: sedated  Airway & Oxygen Therapy: Patient Spontanous Breathing and Patient connected to face mask oxygen  Post-op Assessment: Report given to RN and Post -op Vital signs reviewed and stable  Post vital signs: Reviewed and stable  Last Vitals:  Vitals Value Taken Time  BP    Temp    Pulse 106 12/14/23 13:57  Resp 17 12/14/23 13:57  SpO2 99 % 12/14/23 13:57  Vitals shown include unfiled device data.  Last Pain:  Vitals:   12/14/23 0943  TempSrc: Temporal  PainSc: 0-No pain         Complications: No notable events documented.

## 2023-12-14 NOTE — Plan of Care (Signed)

## 2023-12-14 NOTE — Anesthesia Procedure Notes (Signed)
 Procedure Name: LMA Insertion Date/Time: 12/14/2023 10:43 AM  Performed by: Tod Handing, CRNAPre-anesthesia Checklist: Patient identified, Patient being monitored, Timeout performed, Emergency Drugs available and Suction available Patient Re-evaluated:Patient Re-evaluated prior to induction Oxygen Delivery Method: Circle system utilized Preoxygenation: Pre-oxygenation with 100% oxygen Induction Type: IV induction Ventilation: Mask ventilation without difficulty LMA: LMA inserted LMA Size: 3.0 Tube type: Oral Number of attempts: 1 Placement Confirmation: positive ETCO2 and breath sounds checked- equal and bilateral Tube secured with: Tape Dental Injury: Teeth and Oropharynx as per pre-operative assessment

## 2023-12-14 NOTE — Anesthesia Preprocedure Evaluation (Addendum)
 Anesthesia Evaluation  Patient identified by MRN, date of birth, ID band Patient awake    Reviewed: Allergy & Precautions, NPO status , Patient's Chart, lab work & pertinent test results  Airway Mallampati: III  TM Distance: >3 FB Neck ROM: full  Mouth opening: Limited Mouth Opening  Dental  (+) Teeth Intact, Caps,    Pulmonary neg pulmonary ROS, former smoker   Pulmonary exam normal breath sounds clear to auscultation       Cardiovascular Exercise Tolerance: Good hypertension, Pt. on medications negative cardio ROS Normal cardiovascular exam Rhythm:Regular Rate:Normal     Neuro/Psych    Depression    negative neurological ROS  negative psych ROS   GI/Hepatic negative GI ROS, Neg liver ROS,,,  Endo/Other  negative endocrine ROSdiabetes, Type 2    Renal/GU   negative genitourinary   Musculoskeletal  (+) Arthritis ,    Abdominal Normal abdominal exam  (+)   Peds negative pediatric ROS (+)  Hematology negative hematology ROS (+) Blood dyscrasia, anemia   Anesthesia Other Findings Past Medical History: No date: Anemia No date: Arthritis 2002: Breast cancer (HCC)     Comment:  right breast 2011: Breast cancer (HCC)     Comment:  left breast No date: Cancer (HCC) No date: Chronic kidney disease No date: Family history of breast cancer No date: Family history of lung cancer No date: Family history of non-Hodgkin's lymphoma No date: Family history of prostate cancer No date: Hypertension No date: Malignant neoplasm of right breast (HCC) 2002, 2011: Personal history of radiation therapy     Comment:  right and left breast No date: Pre-diabetes  Past Surgical History: 2018: ABDOMINAL HYSTERECTOMY 10/04/2018: BREAST BIOPSY; Right     Comment:  u/s bx, vision marker, path pending 11/21/2023: BREAST BIOPSY; Left     Comment:  MM LT BREAST BX W LOC DEV 1ST LESION IMAGE BX SPEC               STEREO GUIDE  11/21/2023 [facility] 2002: BREAST LUMPECTOMY; Right     Comment:  breast ca with rad 2011: BREAST LUMPECTOMY; Left     Comment:  breast ca with rad 2011,2002: BREAST SURGERY 2015: CATARACT EXTRACTION; Bilateral No date: COLONOSCOPY 06/14/2019: COLONOSCOPY WITH PROPOFOL ; N/A     Comment:  Procedure: COLONOSCOPY WITH PROPOFOL ;  Surgeon: Unk Corinn Skiff, MD;  Location: [facility];  Service:               Gastroenterology;  Laterality: N/A; 07/15/2022: COLONOSCOPY WITH PROPOFOL ; N/A     Comment:  Procedure: COLONOSCOPY WITH PROPOFOL ;  Surgeon: Unk Corinn Skiff, MD;  Location: [facility];  Service:               Gastroenterology;  Laterality: N/A; 06/14/2019: ESOPHAGOGASTRODUODENOSCOPY (EGD) WITH PROPOFOL ; N/A     Comment:  Procedure: ESOPHAGOGASTRODUODENOSCOPY (EGD) WITH               PROPOFOL ;  Surgeon: Unk Corinn Skiff, MD;  Location:               [facility];  Service: Gastroenterology;  Laterality:               N/A; No date: EYE SURGERY No date: MASTECTOMY; Right 01/06/2020: SIMPLE MASTECTOMY WITH AXILLARY SENTINEL NODE BIOPSY;  Right     Comment:  Procedure:  SIMPLE MASTECTOMY WITH AXILLARY SENTINEL NODE              BIOPSY;  Surgeon: Dessa Reyes ORN, MD;  Location: [facility]              ORS;  Service: General;  Laterality: Right;     Reproductive/Obstetrics negative OB ROS                              Anesthesia Physical Anesthesia Plan  ASA: 3  Anesthesia Plan: General   Post-op Pain Management:    Induction: Intravenous  PONV Risk Score and Plan: 1 and Ondansetron  and Dexamethasone   Airway Management Planned: LMA  Additional Equipment:   Intra-op Plan:   Post-operative Plan: Extubation in OR  Informed Consent: I have reviewed the patients History and Physical, chart, labs and discussed the procedure including the risks, benefits and alternatives for the proposed anesthesia with  the patient or authorized representative who has indicated his/her understanding and acceptance.     Dental Advisory Given  Plan Discussed with: CRNA  Anesthesia Plan Comments:         Anesthesia Quick Evaluation

## 2023-12-15 ENCOUNTER — Encounter: Payer: Self-pay | Admitting: Surgery

## 2023-12-15 ENCOUNTER — Observation Stay
Admission: RE | Admit: 2023-12-15 | Discharge: 2023-12-15 | Disposition: A | Discharge status: Home / Self Care | Source: Home / Self Care

## 2023-12-15 DIAGNOSIS — D0512 Intraductal carcinoma in situ of left breast: Secondary | ICD-10-CM | POA: Diagnosis not present

## 2023-12-15 LAB — BASIC METABOLIC PANEL WITH GFR
Anion gap: 11 (ref 5–15)
BUN: 42 mg/dL — ABNORMAL HIGH (ref 8–23)
CO2: 21 mmol/L — ABNORMAL LOW (ref 22–32)
Calcium: 8.9 mg/dL (ref 8.9–10.3)
Chloride: 100 mmol/L (ref 98–111)
Creatinine, Ser: 1.63 mg/dL — ABNORMAL HIGH (ref 0.44–1.00)
GFR, Estimated: 31 mL/min — ABNORMAL LOW (ref >60)
Glucose, Bld: 148 mg/dL — ABNORMAL HIGH (ref 70–99)
Potassium: 4.6 mmol/L (ref 3.5–5.1)
Sodium: 132 mmol/L — ABNORMAL LOW (ref 135–145)

## 2023-12-15 LAB — GLUCOSE, CAPILLARY
Glucose-Capillary: 128 mg/dL — ABNORMAL HIGH (ref 70–99)
Glucose-Capillary: 101 mg/dL — ABNORMAL HIGH (ref 70–99)

## 2023-12-15 LAB — CBC
HCT: 30.1 % — ABNORMAL LOW (ref 36.0–46.0)
Hemoglobin: 9.4 g/dL — ABNORMAL LOW (ref 12.0–15.0)
MCH: 25.8 pg — ABNORMAL LOW (ref 26.0–34.0)
MCHC: 31.2 g/dL (ref 30.0–36.0)
MCV: 82.7 fL (ref 80.0–100.0)
Platelets: 172 K/uL (ref 150–400)
RBC: 3.64 MIL/uL — ABNORMAL LOW (ref 3.87–5.11)
RDW: 14.2 % (ref 11.5–15.5)
WBC: 11.9 K/uL — ABNORMAL HIGH (ref 4.0–10.5)
nRBC: 0 % (ref 0.0–0.2)

## 2023-12-15 MED ORDER — ALBUMIN HUMAN 25 % IV SOLN
25.0000 g | Freq: Once | INTRAVENOUS | Status: AC
  Administered 2023-12-15: 25 g via INTRAVENOUS
  Filled 2023-12-15: qty 100

## 2023-12-15 MED ORDER — HYDROCODONE-ACETAMINOPHEN 5-325 MG PO TABS: 1.0000 | ORAL_TABLET | Freq: Four times a day (QID) | ORAL | 0 refills | Status: DC | PRN

## 2023-12-15 MED ORDER — IRON SUCROSE 300 MG IVPB - SIMPLE MED
300.0000 mg | Freq: Once | Status: AC
  Administered 2023-12-15: 300 mg via INTRAVENOUS
  Filled 2023-12-15: qty 300

## 2023-12-15 NOTE — Discharge Instructions (Addendum)
 Exercises Following Breast Surgery Ask your health care provider which exercises are safe for you. Do exercises exactly as told by your health care provider and adjust them as directed. It is normal to feel mild stretching, pulling, tightness, or discomfort as you do these exercises. Stop right away if you feel sudden pain or your pain gets worse. Do not begin these exercises until told by your health care provider. Exercises Deep breathing  Lie down on your back. You may bend your knees for comfort. Slowly breathe in as much air as you can and expand your chest and abdomen. Think about pushing your belly button away from your spine while you do this. It may help to put your hands on your belly so you can feel it expand. Slowly breathe out. Repeat these steps 4-5 times or the number of times that is comfortable for you. Wand exercise  Lie down on your back. You may bend your knees for comfort. Position your hands so your thumbs are pointing toward each other. With both hands, hold a wand-shaped object, such as a broom handle or a cane. Your hands should be about shoulder-width apart on the object. Start with the object resting across your hips. Slowly lift the wand toward the ceiling. Use your unaffected arm to help lift the wand. If it is comfortable for you, lengthen the movement to go from your hips to over your head. Repeat these steps 5-7 times. Elbow winging  Lie down on your back. You may bend your knees for comfort. Clasp your hands together behind your head so that your elbows point toward the ceiling. Keep your hands together and move your elbows apart and down toward the floor as far as you comfortably can. Repeat these steps 5-7 times. Swelling reduction, lying down  Lie down on your back. You may bend your knees for comfort. Raise (elevate) your affected arm above the level of your heart and keep it elevated during the exercise. Open and close your hand on the affected side  15-25 times in a row. Bend and straighten your elbow 15-25 times in a row. You may keep your arm elevated for up to 45 minutes at a time. This helps to reduce swelling. You may rest your arm on top of pillows to make this easier. Do this exercise 2-4 times a day. Swelling reduction, sitting  Sit in a chair. You may rest your arm on a pillow. Hold a tennis ball, a rolled-up towel, or a similar object in your hand on your affected side. Slowly squeeze the ball or towel. Try to do this using only your hand muscles. Relax your hand. Repeat these steps 15-25 times. Shoulder blade stretch  Sit in a chair, facing a table. Your back should be against the back of the chair. Place your unaffected arm on the table with your palm down and your elbow bent. This arm will support you and will not move during the exercise. Place your affected arm on the table with your palm down and your elbow straight. Slide your affected arm forward, toward the opposite side of the table, but avoid leaning forward. While you do this, you should feel the shoulder blade on your affected side move away from the back of the chair. You may put a towel under your hand to help it slide more easily. Repeat these steps 5-7 times. Shoulder blade squeeze  Sit in a stable chair with good posture. Avoid letting your back touch the back of the  chair. Your arms should be at your sides with your elbows bent. You may rest your forearms on a pillow. Squeeze your shoulder blades together. Think about trying to bring them down and back. Keep your shoulders level. Do not lift your shoulders up toward your ears. Relax your muscles completely before you repeat this exercise. Repeat these steps 5-7 times. Side bend  Sit in a stable chair with your feet flat on the floor. You may put your feet shoulder-width apart to help you feel more stable. Clasp your hands together in your lap and lift your hands slowly over your head until your arms  are straight. You may use your unaffected arm to help lift your other arm. With your arms above your head, bend at your waist to your right so you feel a stretch in your left side. Straighten your abdomen and move your arms back to the center, above your head. Repeat step 3 on the other side of your body by bending to the left until you feel a stretch in your right side. Repeat these steps 5-7 times. Chest stretch  Stand in a doorway with one of your feet in front of the other. It does not matter which foot is forward. If you cannot reach your forearms to the door frame, do this exercise in a corner of a room. Bend your elbows and place your forearms on the door frame or on each side of the corner. Your elbows should be as close to shoulder height as possible. Slowly move your weight onto your front foot until you feel a stretch across your chest and in the front of your shoulders. Do not let your shoulders move up toward your ears. Repeat these steps 5-7 times. Wall climb, flexion  Stand facing a wall with your toes about 8-10 inches (20-25 cm) from the wall. Place your hands on the wall, about shoulder-width apart. Move your hands up the wall and stretch toward the ceiling (flexion). Do not let your shoulders shrug. Do not arch your back. Repeat these steps 5-7 times. Wall climb, abduction  Stand about 18 inches (45.7 cm) from a wall, with your affected side facing the wall. Bend the elbow of your arm on your affected side, and place your hand on the wall. Move your hand up the wall and toward the ceiling (abduction). Do not let your shoulders shrug. Repeat these steps 5-7 times. Contact a health care provider if you: Have trouble doing any of the exercises. Have pain or swelling that gets worse. Develop: New pain or swelling. A feeling of heaviness in your arm. Numbness or tingling in your arms or chest. This information is not intended to replace advice given to you by your health  care provider. Make sure you discuss any questions you have with your health care provider. Document Revised: 01/05/2021 Document Reviewed: 01/05/2021 Elsevier Patient Education  2024 [employer]   Mastectomy, Care After The following information offers guidance on how to care for yourself after your procedure. Your health care provider may also give you more specific instructions. If you have problems or questions, contact your health care provider. What can I expect after the procedure? After the procedure, it is common to have: Pain and breast tenderness. Breast swelling. Numbness or tingling. Stiffness in your arm or shoulder. A change in breast shape. A change in how the breast feels. Follow these instructions at home: Incision care  Avoid wearing a bra following your procedure to allow the incision to  heal. Your health care provider will tell you when it is safe to wear a bra. Follow instructions from your health care provider about how to take care of your incision. Make sure you: Wash your hands with soap and water for at least 20 seconds before and after you change your bandage (dressing). If soap and water are not available, use hand sanitizer. Change your dressing as told by your health care provider. Keep your dressing dry. Leave stitches (sutures), skin glue, or adhesive strips in place. These skin closures may need to stay in place for 2 weeks or longer. If adhesive strip edges start to loosen and curl up, you may trim the loose edges. Do not remove adhesive strips completely unless your health care provider tells you to do that. Check your incision area and drain area every day for signs of infection. Check for: Redness, swelling, or pain. Fluid or blood. Warmth. Pus or a bad smell. Medicines Take over-the-counter and prescription medicines only as told by your health care provider. Ask your health care provider if the medicine prescribed to you: Requires you to  avoid driving or using machinery. Can cause constipation. You may need to take these actions to prevent or treat constipation: Drink enough fluid to keep your urine pale yellow. Take over-the-counter or prescription medicines. Eat foods that are high in fiber, such as beans, whole grains, and fresh fruits and vegetables. Limit foods that are high in fat and processed sugars, such as fried or sweet foods. Activity  Avoid exercise that requires a lot of effort (is strenuous). Be careful to avoid any activities that could cause an injury to the arm that is on the side of your surgery. You may have to avoid lifting. Ask your health care provider how much you can safely lift. Avoid lifting with the arm that is on the side of your surgery. Ask for help with child care, meal preparation, laundry, and shopping, if you are responsible for these things. Return to your normal activities as told by your health care provider. Ask your health care provider what activities are safe for you. General instructions  Do not take baths, swim, or use a hot tub until your health care provider approves. Ask your health care provider if you may take showers. You may only be allowed to take sponge baths. Do not drive until your health care provider approves. If you have a drain, empty the fluid from the removable drain bulb as directed by your health care provider. Keep all follow-up visits. This is important. Contact a health care provider if: You cannot eat or drink without vomiting. You have more redness, swelling, or pain around your incision or drain. You have fluid or blood coming from your incision or drain. Your incision or drain area feels warm to the touch. You have pus or a bad smell coming from your incision or drain. You develop a fever. Get help right away if: You have new or sudden chest pain. You have chest pain or trouble breathing that gets worse. These symptoms may be an emergency. Get help  right away. Call 911. Do not wait to see if the symptoms will go away. Do not drive yourself to the hospital. Summary Check your incision area and drain area every day for signs of infection, such as redness, swelling, pain, fluid, blood, warmth, pus, or a bad smell. Contact your health care provider if you have a fever. Ask your health care provider if the medicine prescribed to  you requires you to avoid driving or using machinery. Keep all follow-up visits. This is important. This information is not intended to replace advice given to you by your health care provider. Make sure you discuss any questions you have with your health care provider. Document Revised: 02/22/2021 Document Reviewed: 02/22/2021 Elsevier Patient Education  2024 [employer].

## 2023-12-15 NOTE — Plan of Care (Signed)

## 2023-12-15 NOTE — Care Management Obs Status (Signed)
 MEDICARE OBSERVATION STATUS NOTIFICATION   Patient Details  Name: Tracy Lawrence MRN: [id number] Date of Birth: January 02, 1941   Medicare Observation Status Notification Given:  No patient discharged    Rojelio SHAUNNA Rattler 12/15/2023, 4:09 PM

## 2023-12-15 NOTE — Discharge Summary (Signed)
 Patient ID: BRITHNEY BENSEN MRN: [id number] DOB/AGE: [birth year] 83 y.o.  Admit date: 12/14/2023 Discharge date: 12/15/2023   Discharge Diagnoses:  Principal Problem:   Breast cancer [facility]) Active Problems:   Ductal carcinoma in situ (DCIS) of left breast   Procedures:Left mastectomy SLNBx  Hospital Course:   admitted after an uneventful Left mastectomy.  Patient was kept overnight.  At The time of discharge the patient was ambulating,  pain was controlled.  Her vital signs were stable and she was afebrile.   physical exam at discharge showed a pt  in no acute distress.  Awake and alert.  Chest wall: incision healing well without infection or hematoma, JP mainly serous.  Extremities well-perfused and no edema.  Condition of the patient the time of discharge was stable   Consults:   Disposition: Discharge disposition: 01-Home or Self Care       Discharge Instructions     Call MD for:  difficulty breathing, headache or visual disturbances   Complete by: As directed    Call MD for:  extreme fatigue   Complete by: As directed    Call MD for:  hives   Complete by: As directed    Call MD for:  persistant dizziness or light-headedness   Complete by: As directed    Call MD for:  persistant nausea and vomiting   Complete by: As directed    Call MD for:  redness, tenderness, or signs of infection (pain, swelling, redness, odor or green/yellow discharge around incision site)   Complete by: As directed    Call MD for:  severe uncontrolled pain   Complete by: As directed    Call MD for:  temperature >100.4   Complete by: As directed    Diet - low sodium heart healthy   Complete by: As directed    Discharge instructions   Complete by: As directed    Please teach pt about JP care   Discharge wound care:   Complete by: As directed    Please keep sports /mastectomy compression most of the day, may change drain dressing twice a week   Increase activity slowly   Complete by: As  directed       Allergies as of 12/15/2023   No Known Allergies      Medication List     STOP taking these medications    acetaminophen  500 MG tablet Commonly known as: TYLENOL        TAKE these medications    allopurinol  100 MG tablet Commonly known as: ZYLOPRIM  TAKE 1 TABLET(100 MG) BY MOUTH DAILY   amLODipine 2.5 MG tablet Commonly known as: NORVASC Take 2.5 mg by mouth daily.   anastrozole  1 MG tablet Commonly known as: ARIMIDEX  Take 1 tablet (1 mg total) by mouth daily.   CALCIUM  600 + D PO Take 1 tablet by mouth daily.   calcium  carbonate 500 MG chewable tablet Commonly known as: TUMS - dosed in mg elemental calcium  Chew 1,000 mg by mouth daily as needed for indigestion or heartburn.   dapagliflozin  propanediol 10 MG Tabs tablet Commonly known as: Farxiga  Take 1 tablet (10 mg total) by mouth daily.   escitalopram  10 MG tablet Commonly known as: LEXAPRO  TAKE 1 TABLET(10 MG) BY MOUTH DAILY   EYE VITAMINS PO Take 1 tablet by mouth daily.   ezetimibe  10 MG tablet Commonly known as: ZETIA  TAKE 1 TABLET(10 MG) BY MOUTH DAILY   ferrous sulfate  325 (65 FE) MG tablet Take 1 tablet (325  mg total) by mouth daily.   hydrochlorothiazide  12.5 MG capsule Commonly known as: MICROZIDE  TAKE 1 CAPSULE(12.5 MG) BY MOUTH DAILY   HYDROcodone -acetaminophen  5-325 MG tablet Commonly known as: NORCO/VICODIN Take 1-2 tablets by mouth every 6 (six) hours as needed for moderate pain (pain score 4-6).   lidocaine -prilocaine cream Commonly known as: EMLA Apply to L/R areola of the breast and cover with plastic wrap one hour prior to leaving for surgery.   losartan  100 MG tablet Commonly known as: COZAAR  TAKE 1 TABLET(100 MG) BY MOUTH DAILY   LUBRICATING EYE DROPS OP Place 1 drop into both eyes daily as needed (irritated eyes).   rosuvastatin  40 MG tablet Commonly known as: CRESTOR  TAKE 1 TABLET BY MOUTH EVERY DAY FOR CHOLESTEROL               Discharge  Care Instructions  (From admission, onward)           Start     Ordered   12/15/23 0000  Discharge wound care:       Comments: Please keep sports /mastectomy compression most of the day, may change drain dressing twice a week   12/15/23 1302            Follow-up Information     Jizel Cheeks, [place] F, MD Follow up on 01/01/2024.   Specialty: General Surgery Why: May overbook Contact information: [address] [place] [address] [phone]                  Laneta Luna, MD FACS

## 2023-12-18 ENCOUNTER — Other Ambulatory Visit (HOSPITAL_COMMUNITY): Payer: Self-pay

## 2023-12-18 LAB — SURGICAL PATHOLOGY

## 2023-12-18 NOTE — Telephone Encounter (Signed)
 Received patient portion PAP application [place] Farxiga  -refaxed provider portion.

## 2023-12-19 ENCOUNTER — Encounter: Payer: Self-pay | Admitting: *Deleted

## 2023-12-19 NOTE — Telephone Encounter (Signed)
 Path d/w pt in detail. 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

## 2024-01-02 ENCOUNTER — Inpatient Hospital Stay: Attending: Oncology | Admitting: Oncology

## 2024-01-02 ENCOUNTER — Encounter: Payer: Self-pay | Admitting: Oncology

## 2024-01-02 VITALS — BP 153/54 | HR 77 | Temp 97.9°F | Resp 18 | Wt 145.6 lb

## 2024-01-02 DIAGNOSIS — Z79811 Long term (current) use of aromatase inhibitors: Secondary | ICD-10-CM | POA: Diagnosis not present

## 2024-01-02 DIAGNOSIS — I129 Hypertensive chronic kidney disease with stage 1 through stage 4 chronic kidney disease, or unspecified chronic kidney disease: Secondary | ICD-10-CM | POA: Insufficient documentation

## 2024-01-02 DIAGNOSIS — Z9013 Acquired absence of bilateral breasts and nipples: Secondary | ICD-10-CM | POA: Diagnosis not present

## 2024-01-02 DIAGNOSIS — C50411 Malignant neoplasm of upper-outer quadrant of right female breast: Secondary | ICD-10-CM

## 2024-01-02 DIAGNOSIS — N189 Chronic kidney disease, unspecified: Secondary | ICD-10-CM | POA: Diagnosis not present

## 2024-01-02 DIAGNOSIS — Z170 Estrogen receptor positive status [ER+]: Secondary | ICD-10-CM

## 2024-01-02 DIAGNOSIS — E1122 Type 2 diabetes mellitus with diabetic chronic kidney disease: Secondary | ICD-10-CM | POA: Diagnosis not present

## 2024-01-02 DIAGNOSIS — Z87891 Personal history of nicotine dependence: Secondary | ICD-10-CM | POA: Insufficient documentation

## 2024-01-02 DIAGNOSIS — N1831 Chronic kidney disease, stage 3a: Secondary | ICD-10-CM | POA: Diagnosis not present

## 2024-01-02 DIAGNOSIS — D0512 Intraductal carcinoma in situ of left breast: Secondary | ICD-10-CM | POA: Diagnosis present

## 2024-01-02 DIAGNOSIS — N183 Chronic kidney disease, stage 3 unspecified: Secondary | ICD-10-CM

## 2024-01-02 DIAGNOSIS — D631 Anemia in chronic kidney disease: Secondary | ICD-10-CM | POA: Insufficient documentation

## 2024-01-02 MED ORDER — EXEMESTANE 25 MG PO TABS: 25.0000 mg | ORAL_TABLET | Freq: Every day | ORAL | 3 refills | Status: AC

## 2024-01-02 NOTE — Assessment & Plan Note (Addendum)
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 Continue aromatase inhibitor Switch to Aromasin 25mg  daily. till Jan 2027   Bone health Normal bone density in August 2022, continue calcium  1200mg  and vitamin D supplementation. Normal DEXA scheduled in Oct 2024

## 2024-01-02 NOTE — Assessment & Plan Note (Addendum)
 Anemia of CKD, also alpha thalassemia trait.  Lab Results  Component Value Date   HGB 9.4 (L) 12/15/2023   TIBC 291 04/12/2023   IRONPCTSAT 31 04/12/2023   FERRITIN 223 04/12/2023    Decrease of hemoglobin is likely due to acute blood loss from left mastectomy  Recommend oral ferrous sulfate  325mg  every other day

## 2024-01-02 NOTE — Assessment & Plan Note (Signed)
08/27/18 SPEP ordered by nephrology was negative for M protein.   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 Encourage oral hydration and avoid nephrotoxins.

## 2024-01-02 NOTE — Progress Notes (Signed)
 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 Continue aromatase inhibitor Switch to Aromasin 25mg  daily. till Jan 2027   Bone health Normal bone density in August 2022, continue calcium  1200mg  and vitamin D supplementation. 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  Lab Results  Component Value Date   HGB 9.4 (L) 12/15/2023   TIBC 291 04/12/2023   IRONPCTSAT 31 04/12/2023   FERRITIN 223 04/12/2023    Decrease of hemoglobin is likely due to acute blood loss from left mastectomy  Recommend oral ferrous sulfate  325mg  every other day   CKD stage 3 due to type 2 diabetes mellitus (HCC) 08/27/18 SPEP ordered by nephrology was negative for M protein.   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 Encourage oral hydration and avoid nephrotoxins.    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  She has had bilateral mastectomy now.  Continue adjuvant AI, switch to Aromasin 25mg  daily.    No orders of the defined types were placed in this encounter.   Return of visit:  3 months  All questions were answered. The patient knows to call the clinic with any problems, questions or concerns.  Zelphia Cap, MD, PhD [facility] Hematology Oncology 01/02/2024   PERTINENT ONCOLOGY HISTORY Tracy Lawrence is a  83 y.o.  female with PMH listed below was seen in consultation at the request of  Dineen Rollene KANDICE, FNP  for evaluation of abnormal SPEP. Reviewed patient's previous blood work via care everywhere. 08/27/2018, free light chain ratio 1.97, free  kappa 79.5, free lambda 40.3. Protein electrophoresis showed increased alpha globulin.  #Patient moved from [place]  to [place]  last year. She reports history of right breast cancer in 2002 and left breast cancer in 2011.  She underwent right breast lumpectomy followed by adjuvant radiation followed by 5 years of tamoxifen.  Underwent left lumpectomy followed by adjuvant radiation followed by aromatase inhibitor for 5 years.  Currently she is not on any antiestrogen treatment.  Denies any chemotherapy treatment history.  Both breast cancer treatments were done in [place] . Obtained records from [facility] Records were reviewed.  No pathology is available. 06/06/2017 screening mammogram showed postoperative changes in both breasts.  Architectural distortion has not changed significantly.  Skin thickening is similar.  Calcifications have increased mildly around the postoperative sites bilaterally.  No new dominant mass.   # Chronic history of kidney insufficiency, stage IIIb.  Patient sees Dr. Marcelino.  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  # Breast exam was performed in seated and lying down position. Right breast lower inner quadrant lumpectomy scar with focal scar tissue. Left breast upper outer quadrant, 1:00 lumpectomy scar with palpable round thickened breast tissue. No palpable axillary lymphadenopathy.  # #Family history of breast cancer in first-degree relatives, patient reports that she was previously tested negative for BRCA gene mutation.  Results were not available. Patient was referred to genetic counselor and 11/19/2019 [facility] Breast Cancer STAT Panel + Multi- cancer panel found no pathogenic mutations.   # 09/17/2019 bilateral  screening mammogram showed right breast mass which warrants further evaluation.  No suspicious findings in left breast. 09/25/2019, right diagnostic breast mammogram  showed specialist right breast mass at 10:00. Right axilla is negative for adenopathy.  Patient underwent ultrasound-guided core biopsy of the right breast 10:00 location. 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  Grade 2, no DCIS or LVI.  ER 90%, PR 11-50%, HER-2 negative.  Chronic microcytic anemia, iron  panel is not consistent with iron  deficiency. Normal hemoglobin evaluation.  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 Hemoglobin stable at 11.6. GI work-up includes EGD and colonoscopy on 06/14/2019.  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  Benign colon polyps.  No high-grade dysplasia or malignancy.  She follows up with Dr. Unk Anemia can be also secondary to CKD.  # #01/06/2020 patient underwent right mastectomy and sentinel lymph node biopsy.  Invasive mammary carcinoma, ductal, grade 1, negative margin, all 7 lymph nodes negative pT1b pN0  Oncotype DX recurrence score is 17, patient above 83 years old, distant recurrence risk at 9 years 5%, chemotherapy benefit less than 1%.  No adjuvant chemotherapy will be offered She had mastectomy with negative margin, no need for adjuvant radiation. Baseline elevated CA 27.29--> post operation normalization of CEA 27.29 February 2020, started on Arimidex  She was seen by Dr. Dessa 04/02/2020 for seroma.  #INTERVAL HISTORY Tracy Lawrence is a 83 y.o. female who has above history reviewed by me today presents for follow up visit for breast cancer.  11/16/2023, diagnostic mammogram of left breast showed Branching microcalcifications in the upper-outer quadrant of the LEFT breast spanning 3.8 cm are indeterminate 11/21/2023, left breast calcifications stereotactic biopsy showed DCIS, intermediate grade with necrosis and calcifications. 12/14/2023, patient status post left mastectomy Pathology showed residual DCIS, intermediate grade, without necrosis.  Margins were negative.  ER positive.  pTis, pN0   She  feels well today. Still has drainage tube.   Review of Systems  Constitutional:  Negative for appetite change, chills, fatigue and fever.  HENT:   Negative for hearing loss and voice change.   Eyes:  Negative for eye problems.  Respiratory:  Negative for chest tightness and cough.   Cardiovascular:  Negative for chest pain.  Gastrointestinal:  Negative for abdominal distention, abdominal pain and blood in stool.  Endocrine: Positive for hot flashes.  Genitourinary:  Negative for difficulty urinating and frequency.   Musculoskeletal:  Positive for arthralgias.  Skin:  Negative for itching and rash.  Neurological:  Negative for extremity weakness.  Hematological:  Negative for adenopathy.  Psychiatric/Behavioral:  Negative for confusion.    Right mastectomy MEDICAL HISTORY:  Past Medical History:  Diagnosis Date   Anemia    Arthritis    Breast cancer (HCC) 2002   right breast   Breast cancer (HCC) 2011   left breast   Cancer (HCC)    Chronic kidney disease    Family history of breast cancer    Family history of lung cancer    Family history of non-Hodgkin's lymphoma    Family history of prostate cancer    Hypertension    Malignant neoplasm of right breast [facility])    Personal history of radiation therapy 2002, 2011   right and left breast   Pre-diabetes     SURGICAL HISTORY: Past Surgical History:  Procedure Laterality Date   ABDOMINAL HYSTERECTOMY  2018   AXILLARY SENTINEL NODE BIOPSY Left 12/14/2023   Procedure: BIOPSY, LYMPH NODE, SENTINEL, AXILLARY;  Surgeon: Jordis Cliche  F, MD;  Location: [facility] ORS;  Service: General;  Laterality: Left;   BREAST BIOPSY Right 10/04/2018   u/s bx, vision marker, path pending   BREAST BIOPSY Left 11/21/2023   MM LT BREAST BX W LOC DEV 1ST LESION IMAGE BX SPEC STEREO GUIDE 11/21/2023 [facility]   BREAST LUMPECTOMY Right Jan 30, 2001   breast ca with rad   BREAST LUMPECTOMY Left 01-30-2010   breast ca with rad   BREAST SURGERY  01/30/10    CATARACT EXTRACTION Bilateral 2015   COLONOSCOPY     COLONOSCOPY WITH PROPOFOL  N/A 06/14/2019   Procedure: COLONOSCOPY WITH PROPOFOL ;  Surgeon: Unk Corinn Skiff, MD;  Location: [facility] ENDOSCOPY;  Service: Gastroenterology;  Laterality: N/A;   COLONOSCOPY WITH PROPOFOL  N/A 07/15/2022   Procedure: COLONOSCOPY WITH PROPOFOL ;  Surgeon: Unk Corinn Skiff, MD;  Location: [facility] ENDOSCOPY;  Service: Gastroenterology;  Laterality: N/A;   ESOPHAGOGASTRODUODENOSCOPY (EGD) WITH PROPOFOL  N/A 06/14/2019   Procedure: ESOPHAGOGASTRODUODENOSCOPY (EGD) WITH PROPOFOL ;  Surgeon: Unk Corinn Skiff, MD;  Location: [facility] ENDOSCOPY;  Service: Gastroenterology;  Laterality: N/A;   EYE SURGERY     MASTECTOMY Right    SIMPLE MASTECTOMY WITH AXILLARY SENTINEL NODE BIOPSY Right 01/06/2020   Procedure: SIMPLE MASTECTOMY WITH AXILLARY SENTINEL NODE BIOPSY;  Surgeon: Dessa Reyes ORN, MD;  Location: [facility] ORS;  Service: General;  Laterality: Right;   TOTAL MASTECTOMY Left 12/14/2023   Procedure: MASTECTOMY, SIMPLE;  Surgeon: Jordis Laneta FALCON, MD;  Location: [facility] ORS;  Service: General;  Laterality: Left;    SOCIAL HISTORY: Social History   Socioeconomic History   Marital status: Married    Spouse name: Renny   Number of children: 2   Years of education: Not on file   Highest education level: Not on file  Occupational History   Occupation: retired  Tobacco Use   Smoking status: Former    Current packs/day: 0.00    Average packs/day: 1 pack/day for 12.0 years (12.0 ttl pk-yrs)    Types: Cigarettes    Start date: 03/18/1967    Quit date: 03/18/1979    Years since quitting: 44.8   Smokeless tobacco: Never   Tobacco comments:    quit 40 years ago  Vaping Use   Vaping status: Never Used  Substance and Sexual Activity   Alcohol use: Yes    Comment: rarely, 1 glass of wine monthly or less   Drug use: Never   Sexual activity: Not Currently  Other Topics Concern   Not on file  Social History Narrative   From  [place]       Retired, volunteers 1 day at week at [employer]      Has 2 children, 1 son deceased 2013/01/30   Social Drivers of Corporate Investment Banker Strain: Low Risk  (11/10/2022)   Overall Financial Resource Strain (CARDIA)    Difficulty of Paying Living Expenses: Not hard at all  Food Insecurity: No Food Insecurity (12/14/2023)   Hunger Vital Sign    Worried About Running Out of Food in the Last Year: Never true    Ran Out of Food in the Last Year: Never true  Transportation Needs: No Transportation Needs (12/14/2023)   PRAPARE - Administrator, Civil Service (Medical): No    Lack of Transportation (Non-Medical): No  Physical Activity: Insufficiently Active (11/10/2022)   Exercise Vital Sign    Days of Exercise per Week: 2 days    Minutes of Exercise per Session: 30 min  Stress: No Stress Concern Present (11/10/2022)  [employer] of Occupational Health - Occupational Stress Questionnaire    Feeling of Stress : Not at all  Social Connections: Moderately Integrated (12/14/2023)   Social Connection and Isolation Panel    Frequency of Communication with Friends and Family: Twice a week    Frequency of Social Gatherings with Friends and Family: More than three times a week    Attends Religious Services: More than 4 times per year    Active Member of [employer] or Organizations: No    Attends Banker Meetings: Never    Marital Status: Married  Catering Manager Violence: Not At Risk (12/14/2023)   Humiliation, Afraid, Rape, and Kick questionnaire    Fear of Current or Ex-Partner: No    Emotionally Abused: No    Physically Abused: No    Sexually Abused: No    FAMILY HISTORY: Family History  Problem Relation Age of Onset   Heart attack Mother    Cancer Father    Lung cancer Father    Breast cancer Sister 96       dx again 67   Heart attack Sister    Non-Hodgkin's lymphoma Sister 86   Heart attack Sister    Dementia Sister 50   Breast cancer  Paternal Aunt    Breast cancer Maternal Aunt    Prostate cancer Paternal Grandfather        metastic, d. 53s   Colon cancer Neg Hx     ALLERGIES:  has no known allergies.  MEDICATIONS:  Current Outpatient Medications  Medication Sig Dispense Refill   allopurinol  (ZYLOPRIM ) 100 MG tablet TAKE 1 TABLET(100 MG) BY MOUTH DAILY 90 tablet 3   amLODipine (NORVASC) 2.5 MG tablet Take 2.5 mg by mouth daily.     Calcium  Carb-Cholecalciferol (CALCIUM  600 + D PO) Take 1 tablet by mouth daily.     calcium  carbonate (TUMS - DOSED IN MG ELEMENTAL CALCIUM ) 500 MG chewable tablet Chew 1,000 mg by mouth daily as needed for indigestion or heartburn.     Carboxymethylcellul-Glycerin (LUBRICATING EYE DROPS OP) Place 1 drop into both eyes daily as needed (irritated eyes).     dapagliflozin  propanediol (FARXIGA ) 10 MG TABS tablet Take 1 tablet (10 mg total) by mouth daily. 90 tablet 3   escitalopram  (LEXAPRO ) 10 MG tablet TAKE 1 TABLET(10 MG) BY MOUTH DAILY 90 tablet 3   exemestane (AROMASIN) 25 MG tablet Take 1 tablet (25 mg total) by mouth daily after breakfast. 30 tablet 3   ezetimibe  (ZETIA ) 10 MG tablet TAKE 1 TABLET(10 MG) BY MOUTH DAILY 90 tablet 1   ferrous sulfate  325 (65 FE) MG tablet Take 1 tablet (325 mg total) by mouth daily.     hydrochlorothiazide  (MICROZIDE ) 12.5 MG capsule TAKE 1 CAPSULE(12.5 MG) BY MOUTH DAILY 90 capsule 3   HYDROcodone -acetaminophen  (NORCO/VICODIN) 5-325 MG tablet Take 1-2 tablets by mouth every 6 (six) hours as needed for moderate pain (pain score 4-6). 20 tablet 0   lidocaine -prilocaine  (EMLA ) cream Apply to L/R areola of the breast and cover with plastic wrap one hour prior to leaving for surgery. 5 g 0   losartan  (COZAAR ) 100 MG tablet TAKE 1 TABLET(100 MG) BY MOUTH DAILY 90 tablet 3   Multiple Vitamins-Minerals (EYE VITAMINS PO) Take 1 tablet by mouth daily.      rosuvastatin  (CRESTOR ) 40 MG tablet TAKE 1 TABLET BY MOUTH EVERY DAY FOR CHOLESTEROL 90 tablet 3   No  current facility-administered medications for this visit.     PHYSICAL  EXAMINATION: ECOG PERFORMANCE STATUS: 0 - Asymptomatic Vitals:   01/02/24 1059 01/02/24 1108  BP: (!) 149/58 (!) 153/54  Pulse: 77   Resp: 18   Temp: 97.9 F (36.6 C)   SpO2: 100%    Filed Weights   01/02/24 1059  Weight: 145 lb 9.6 oz (66 kg)    Physical Exam Constitutional:      General: She is not in acute distress. HENT:     Head: Normocephalic and atraumatic.  Eyes:     General: No scleral icterus. Cardiovascular:     Rate and Rhythm: Normal rate and regular rhythm.     Heart sounds: Normal heart sounds.  Pulmonary:     Effort: Pulmonary effort is normal. No respiratory distress.     Breath sounds: No wheezing.  Abdominal:     General: Bowel sounds are normal. There is no distension.     Palpations: Abdomen is soft.  Musculoskeletal:        General: No deformity. Normal range of motion.     Cervical back: Normal range of motion and neck supple.  Skin:    General: Skin is warm and dry.     Findings: No erythema or rash.  Neurological:     Mental Status: She is alert and oriented to person, place, and time. Mental status is at baseline.     Cranial Nerves: No cranial nerve deficit.     Coordination: Coordination normal.  Psychiatric:        Mood and Affect: Mood normal.   Breast exam was performed in seated  position. History of Right breast mastectomy.   S/p recent left mastectomy, + drainage tube   LABORATORY DATA:  I have reviewed the data as listed     Latest Ref Rng & Units 12/15/2023    4:59 AM 11/09/2023   10:20 AM 04/12/2023   10:31 AM  CBC  WBC 4.0 - 10.5 K/uL 11.9  4.9  6.2   Hemoglobin 12.0 - 15.0 g/dL 9.4  88.9  88.2   Hematocrit 36.0 - 46.0 % 30.1  34.8  37.9   Platelets 150 - 400 K/uL 172  178  180       Latest Ref Rng & Units 12/15/2023    4:59 AM 11/09/2023   10:20 AM 04/12/2023   10:31 AM  CMP  Glucose 70 - 99 mg/dL 851  869  890   BUN 8 - 23 mg/dL 42  38   40   Creatinine 0.44 - 1.00 mg/dL 8.36  8.31  8.49   Sodium 135 - 145 mmol/L 132  135  133   Potassium 3.5 - 5.1 mmol/L 4.6  4.6  4.6   Chloride 98 - 111 mmol/L 100  101  100   CO2 22 - 32 mmol/L 21  26  26    Calcium  8.9 - 10.3 mg/dL 8.9  9.8  89.7   Total Protein 6.5 - 8.1 g/dL  6.9  7.3   Total Bilirubin 0.0 - 1.2 mg/dL  0.5  0.6   Alkaline Phos 38 - 126 U/L  81  59   AST 15 - 41 U/L  18  17   ALT 0 - 44 U/L  11  14     Iron /TIBC/Ferritin/ %Sat    Component Value Date/Time   IRON  89 04/12/2023 1031   TIBC 291 04/12/2023 1031   FERRITIN 223 04/12/2023 1031   IRONPCTSAT 31 04/12/2023 1031      RADIOGRAPHIC STUDIES: I have  personally reviewed the radiological images as listed and agreed with the findings in the report. DG BREAST SURGICAL SPECIMEN NO CHARGE Result Date: 12/15/2023 This procedure is a no report and no charge.  It will auto finalize.  NM Sentinel Node Inj-No Rpt (Breast) Result Date: 12/14/2023 Lymphoseek was injected by the Nuclear Medicine Technologist for sentinel lymph node localization.   MM LT BREAST BX W LOC DEV 1ST LESION IMAGE BX SPEC STEREO GUIDE Addendum Date: 11/23/2023 ADDENDUM REPORT: 11/23/2023 07:42 ADDENDUM: PATHOLOGY revealed: Breast, left, needle core biopsy, UOQ 3.8 cm ( x clip)- DUCTAL CARCINOMA IN SITU, INTERMEDIATE NUCLEAR GRADE, SOLID TYPE, WITH NECROSIS AND CALCIFICATIONS, 2 MM IN GREATEST DIMENSION. Pathology results are CONCORDANT with imaging findings, per Dr. Norleen Croak. Pathology results and recommendations were discussed with patient via telephone on 11/22/2023 by Rock Hover RN. Patient reported biopsy site doing well with no adverse symptoms, and slight tenderness at the site. Post biopsy care instructions were reviewed, questions were answered and my direct phone number was provided. Patient was instructed to call [facility] for any additional questions or concerns related to biopsy site. RECOMMENDATIONS: 1. Surgical and  oncological consultation. Per patient request, surgical consultation was sent to provider (oncologist) by Rock Hover RN on 11/22/2023. Pathology results reported by Rock Hover RN on 11/22/2023. Electronically Signed   By: Norleen Croak M.D.   On: 11/23/2023 07:42   Result Date: 11/23/2023 CLINICAL DATA:  Suspicious LEFT breast calcifications EXAM: LEFT BREAST STEREOTACTIC CORE NEEDLE BIOPSY COMPARISON:  Previous exam(s). FINDINGS: The patient and I discussed the procedure of stereotactic-guided biopsy including benefits and alternatives. We discussed the high likelihood of a successful procedure. We discussed the risks of the procedure including infection, bleeding, tissue injury, clip migration, and inadequate sampling. Informed written consent was given. The usual time out protocol was performed immediately prior to the procedure. Using sterile technique and 1% lidocaine  and 1% lidocaine  with epinephrine  as local anesthetic, under stereotactic guidance, a 9 gauge vacuum assisted device was used to perform core needle biopsy of calcifications in the lower outer quadrant of the LEFT breast using a superior approach. Specimen radiograph was performed showing representative calcifications. Specimens with calcifications are identified for pathology. Lesion quadrant: Upper-outer At the conclusion of the procedure, X shaped tissue marker clip was deployed into the biopsy cavity. Follow-up 2-view mammogram was performed and dictated separately. IMPRESSION: Stereotactic-guided biopsy of LEFT breast calcifications in the upper-outer quadrant. No apparent complications. Electronically Signed: By: Norleen Croak M.D. On: 11/21/2023 13:12   MM CLIP PLACEMENT LEFT Result Date: 11/21/2023 CLINICAL DATA:  LEFT breast calcifications status post biopsy EXAM: 3D DIAGNOSTIC LEFT MAMMOGRAM POST MRI BIOPSY COMPARISON:  Previous exam(s). ACR Breast Density Category b: There are scattered areas of fibroglandular density. FINDINGS: 3D  Mammographic images were obtained following stereotactic guided biopsy of LEFT breast calcifications in the upper-outer quadrant. The biopsy marking clip is in expected position at the site of biopsy. IMPRESSION: Appropriate positioning of the X shaped biopsy marking clip at the site of biopsy in the LEFT breast. Final Assessment: Post Procedure Mammograms for Marker Placement Electronically Signed   By: Norleen Croak M.D.   On: 11/21/2023 13:17   MM 3D DIAGNOSTIC MAMMOGRAM UNILATERAL LEFT BREAST Result Date: 11/16/2023 CLINICAL DATA:  History of RIGHT breast mastectomy and LEFT breast lumpectomy. Screening recall for LEFT breast calcifications. EXAM: DIGITAL DIAGNOSTIC UNILATERAL LEFT MAMMOGRAM WITH TOMOSYNTHESIS AND CAD TECHNIQUE: Left digital diagnostic mammography and breast tomosynthesis was performed. The images were evaluated  with computer-aided detection. COMPARISON:  Previous exam(s). ACR Breast Density Category b: There are scattered areas of fibroglandular density. FINDINGS: Posttreatment changes of the LEFT breast are again noted. In the upper-outer quadrant of the LEFT breast there are branching microcalcifications spanning approximately 3.8 cm in anterior to posterior diameter. IMPRESSION: Branching microcalcifications in the upper-outer quadrant of the LEFT breast spanning 3.8 cm are indeterminate and require further characterization with stereotactic biopsy. RECOMMENDATION: Stereotactic biopsy of the LEFT breast x1. I have discussed the findings and recommendations with the patient. The biopsy procedure was discussed with the patient and questions were answered. Patient expressed their understanding of the biopsy recommendation. Patient will be scheduled for biopsy at her earliest convenience by the schedulers. Ordering provider will be notified. If applicable, a reminder letter will be sent to the patient regarding the next appointment. BI-RADS CATEGORY  4: Suspicious. Electronically Signed   By:  Norleen Croak M.D.   On: 11/16/2023 08:15   MM 3D SCREENING MAMMOGRAM UNILATERAL LEFT BREAST Result Date: 11/10/2023 CLINICAL DATA:  Screening. EXAM: DIGITAL SCREENING UNILATERAL LEFT MAMMOGRAM WITH CAD AND TOMOSYNTHESIS TECHNIQUE: Left screening digital craniocaudal and mediolateral oblique mammograms were obtained. Left screening digital breast tomosynthesis was performed. The images were evaluated with computer-aided detection. COMPARISON:  Previous exam(s). ACR Breast Density Category b: There are scattered areas of fibroglandular density. FINDINGS: In the left breast, calcifications warrant further evaluation. Patient has had a right mastectomy. IMPRESSION: Further evaluation is suggested for calcifications in the left breast. RECOMMENDATION: Diagnostic mammogram of the left breast. (Code:[id number]-[id number]) The patient will be contacted regarding the findings, and additional imaging will be scheduled. BI-RADS CATEGORY  0: Incomplete: Need additional imaging evaluation. Electronically Signed   By: Dina  Arceo M.D.   On: 11/10/2023 08:26

## 2024-01-02 NOTE — Assessment & Plan Note (Signed)
 ER positive left breast DCIS, s/p left mastectomy.  She has had bilateral mastectomy now.  Continue adjuvant AI, switch to Aromasin 25mg  daily.

## 2024-01-03 ENCOUNTER — Encounter: Payer: Self-pay | Admitting: Surgery

## 2024-01-03 ENCOUNTER — Encounter: Payer: Self-pay | Admitting: *Deleted

## 2024-01-08 ENCOUNTER — Ambulatory Visit: Admitting: Surgery

## 2024-01-08 ENCOUNTER — Encounter: Payer: Self-pay | Admitting: Surgery

## 2024-01-08 VITALS — BP 154/83 | HR 73 | Ht 62.0 in | Wt 143.0 lb

## 2024-01-08 DIAGNOSIS — Z09 Encounter for follow-up examination after completed treatment for conditions other than malignant neoplasm: Secondary | ICD-10-CM

## 2024-01-08 DIAGNOSIS — D0512 Intraductal carcinoma in situ of left breast: Secondary | ICD-10-CM

## 2024-01-08 NOTE — Progress Notes (Signed)
 S/p left mastectomy 3 weeks ago Doing well Jp 10 cc a day  Path reviewed neg margins   PE NAD Chest wall incision healing well, drain removed  A/p Doing very well no complications Rtc 3 months

## 2024-01-10 ENCOUNTER — Telehealth: Payer: Self-pay | Admitting: Family

## 2024-01-10 NOTE — Telephone Encounter (Signed)
 I received a note from [facility] that was lacking information.  They said they were missing information.  Is this for Farxiga ?  I do not see any notes.   Do I print and fax to number provided [phone]?

## 2024-01-16 ENCOUNTER — Ambulatory Visit: Admitting: *Deleted

## 2024-01-16 VITALS — Ht 61.0 in | Wt 143.0 lb

## 2024-01-16 DIAGNOSIS — Z0000 Encounter for general adult medical examination without abnormal findings: Secondary | ICD-10-CM

## 2024-01-16 LAB — OPHTHALMOLOGY REPORT-SCANNED

## 2024-01-16 NOTE — Patient Instructions (Signed)
 Tracy Lawrence,  Thank you for taking the time for your Medicare Wellness Visit. I appreciate your continued commitment to your health goals. Please review the care plan we discussed, and feel free to reach out if I can assist you further.  Please note that Annual Wellness Visits do not include a physical exam. Some assessments may be limited, especially if the visit was conducted virtually. If needed, we may recommend an in-person follow-up with your provider.  Ongoing Care Seeing your primary care provider every 3 to 6 months helps us  monitor your health and provide consistent, personalized care.  Remember to update your tetanus (Tdap) vaccine.  Referrals If a referral was made during today's visit and you haven't received any updates within two weeks, please contact the referred provider directly to check on the status.  Recommended Screenings:  Health Maintenance  Topic Date Due   DTaP/Tdap/Td vaccine (1 - Tdap) Never done   Zoster (Shingles) Vaccine (1 of 2) Never done   COVID-19 Vaccine (3 - Pfizer risk series) 04/25/2019   Eye exam for diabetics  09/15/2023   Complete foot exam   05/23/2024   Hemoglobin A1C  06/03/2024   Yearly kidney health urinalysis for diabetes  07/12/2024   Osteoporosis screening with Bone Density Scan  11/06/2024   Breast Cancer Screening  11/07/2024   Yearly kidney function blood test for diabetes  12/14/2024   Medicare Annual Wellness Visit  01/15/2025   Colon Cancer Screening  07/14/2025   Pneumococcal Vaccine for age over 59  Completed   Flu Shot  Completed   Meningitis B Vaccine  Aged Out       01/16/2024    2:23 PM  Advanced Directives  Does Patient Have a Medical Advance Directive? Yes  Type of Estate Agent of [place];Living will  Does patient want to make changes to medical advance directive? No - Patient declined  Copy of Healthcare Power of Attorney in Chart? No - copy requested    Vision: Annual vision screenings  are recommended for early detection of glaucoma, cataracts, and diabetic retinopathy. These exams can also reveal signs of chronic conditions such as diabetes and high blood pressure.  Dental: Annual dental screenings help detect early signs of oral cancer, gum disease, and other conditions linked to overall health, including heart disease and diabetes.  Please see the attached documents for additional preventive care recommendations.

## 2024-01-16 NOTE — Progress Notes (Signed)
 Chief Complaint  Patient presents with   Medicare Wellness     Subjective:   Tracy Lawrence is a 83 y.o. female who presents for a Medicare Annual Wellness Visit.  Visit info / Clinical Intake: Medicare Wellness Visit Type:: Subsequent Annual Wellness Visit Persons participating in visit and providing information:: patient Medicare Wellness Visit Mode:: Telephone If telephone:: video declined Since this visit was completed virtually, some vitals may be partially provided or unavailable. Missing vitals are due to the limitations of the virtual format.: Unable to obtain vitals - no equipment If Telephone or Video please confirm:: I connected with patient using audio/video enable telemedicine. I verified patient identity with two identifiers, discussed telehealth limitations, and patient agreed to proceed. Patient Location:: Home Provider Location:: Office/Home Interpreter Needed?: No Pre-visit prep was completed: yes AWV questionnaire completed by patient prior to visit?: no Living arrangements:: lives with spouse/significant other Patient's Overall Health Status Rating: good Typical amount of pain: some (getting over breast surgery) Does pain affect daily life?: no Are you currently prescribed opioids?: no  Dietary Habits and Nutritional Risks How many meals a day?: 3 Eats fruit and vegetables daily?: (!) no Most meals are obtained by: preparing own meals In the last 2 weeks, have you had any of the following?: none Diabetic:: (!) yes Any non-healing wounds?: no How often do you check your BS?: as needed Would you like to be referred to a Nutritionist or for Diabetic Management? : no  Functional Status Activities of Daily Living (to include ambulation/medication): Independent Ambulation: Independent Medication Administration: Independent Home Management (perform basic housework or laundry): Independent Manage your own finances?: yes Primary transportation is:  driving Concerns about vision?: no *vision screening is required for WTM* Concerns about hearing?: (!) yes (plans on having hearing checked) Uses hearing aids?: no Hear whispered voice?: -- (televisit)  Fall Screening Falls in the past year?: 0 Number of falls in past year: 0 Was there an injury with Fall?: 0 Fall Risk Category Calculator: 0 Patient Fall Risk Level: Low Fall Risk  Fall Risk Patient at Risk for Falls Due to: No Fall Risks Fall risk Follow up: Falls evaluation completed; Falls prevention discussed  Home and Transportation Safety: All rugs have non-skid backing?: yes All stairs or steps have railings?: yes Grab bars in the bathtub or shower?: yes Have non-skid surface in bathtub or shower?: yes Good home lighting?: yes Regular seat belt use?: yes Hospital stays in the last year:: (!) yes How many hospital stays:: 1 Reason: breast surgery  Cognitive Assessment Difficulty concentrating, remembering, or making decisions? : no Will 6CIT or Mini Cog be Completed: yes What year is it?: 0 points What month is it?: 0 points Give patient an address phrase to remember (5 components): [address] [facility] [place] About what time is it?: 0 points Count backwards from 20 to 1: 0 points Say the months of the year in reverse: 0 points Repeat the address phrase from earlier: 0 points 6 CIT Score: 0 points  Advance Directives (For Healthcare) Does Patient Have a Medical Advance Directive?: Yes Does patient want to make changes to medical advance directive?: No - Patient declined Type of Advance Directive: Healthcare Power of Progress; Living will Copy of Healthcare Power of Attorney in Chart?: No - copy requested Copy of Living Will in Chart?: No - copy requested  Reviewed/Updated  Reviewed/Updated: Reviewed All (Medical, Surgical, Family, Medications, Allergies, Care Teams, Patient Goals)    Allergies (verified) Patient has no known allergies.  Current Medications  (verified) Outpatient Encounter Medications as of 01/16/2024  Medication Sig   allopurinol  (ZYLOPRIM ) 100 MG tablet TAKE 1 TABLET(100 MG) BY MOUTH DAILY   amLODipine (NORVASC) 2.5 MG tablet Take 2.5 mg by mouth daily.   Calcium  Carb-Cholecalciferol (CALCIUM  600 + D PO) Take 1 tablet by mouth daily.   calcium  carbonate (TUMS - DOSED IN MG ELEMENTAL CALCIUM ) 500 MG chewable tablet Chew 1,000 mg by mouth daily as needed for indigestion or heartburn.   Carboxymethylcellul-Glycerin (LUBRICATING EYE DROPS OP) Place 1 drop into both eyes daily as needed (irritated eyes).   dapagliflozin  propanediol (FARXIGA ) 10 MG TABS tablet Take 1 tablet (10 mg total) by mouth daily.   escitalopram  (LEXAPRO ) 10 MG tablet TAKE 1 TABLET(10 MG) BY MOUTH DAILY   exemestane  (AROMASIN ) 25 MG tablet Take 1 tablet (25 mg total) by mouth daily after breakfast.   ezetimibe  (ZETIA ) 10 MG tablet TAKE 1 TABLET(10 MG) BY MOUTH DAILY   ferrous sulfate  325 (65 FE) MG tablet Take 1 tablet (325 mg total) by mouth daily.   hydrochlorothiazide  (MICROZIDE ) 12.5 MG capsule TAKE 1 CAPSULE(12.5 MG) BY MOUTH DAILY   losartan  (COZAAR ) 100 MG tablet TAKE 1 TABLET(100 MG) BY MOUTH DAILY   Multiple Vitamins-Minerals (EYE VITAMINS PO) Take 1 tablet by mouth daily.    rosuvastatin  (CRESTOR ) 40 MG tablet TAKE 1 TABLET BY MOUTH EVERY DAY FOR CHOLESTEROL   lidocaine -prilocaine  (EMLA ) cream Apply to L/R areola of the breast and cover with plastic wrap one hour prior to leaving for surgery. (Patient not taking: Reported on 01/16/2024)   No facility-administered encounter medications on file as of 01/16/2024.    History: Past Medical History:  Diagnosis Date   Anemia    Arthritis    Breast cancer (HCC) 2002   right breast   Breast cancer (HCC) 2011   left breast   Cancer (HCC)    Chronic kidney disease    Family history of breast cancer    Family history of lung cancer    Family history of non-Hodgkin's lymphoma    Family history of  prostate cancer    Hypertension    Malignant neoplasm of right breast [facility])    Personal history of radiation therapy 2002, 2011   right and left breast   Pre-diabetes    Past Surgical History:  Procedure Laterality Date   ABDOMINAL HYSTERECTOMY  2018   AXILLARY SENTINEL NODE BIOPSY Left 12/14/2023   Procedure: BIOPSY, LYMPH NODE, SENTINEL, AXILLARY;  Surgeon: Jordis Laneta FALCON, MD;  Location: [facility] ORS;  Service: General;  Laterality: Left;   BREAST BIOPSY Right 10/04/2018   u/s bx, vision marker, path pending   BREAST BIOPSY Left 11/21/2023   MM LT BREAST BX W LOC DEV 1ST LESION IMAGE BX SPEC STEREO GUIDE 11/21/2023 [facility]   BREAST LUMPECTOMY Right 2002   breast ca with rad   BREAST LUMPECTOMY Left 2011   breast ca with rad   BREAST SURGERY  2011,2002   CATARACT EXTRACTION Bilateral 2015   COLONOSCOPY     COLONOSCOPY WITH PROPOFOL  N/A 06/14/2019   Procedure: COLONOSCOPY WITH PROPOFOL ;  Surgeon: Unk Corinn Skiff, MD;  Location: [facility] ENDOSCOPY;  Service: Gastroenterology;  Laterality: N/A;   COLONOSCOPY WITH PROPOFOL  N/A 07/15/2022   Procedure: COLONOSCOPY WITH PROPOFOL ;  Surgeon: Unk Corinn Skiff, MD;  Location: [facility] ENDOSCOPY;  Service: Gastroenterology;  Laterality: N/A;   ESOPHAGOGASTRODUODENOSCOPY (EGD) WITH PROPOFOL  N/A 06/14/2019   Procedure: ESOPHAGOGASTRODUODENOSCOPY (EGD) WITH PROPOFOL ;  Surgeon: Unk Corinn Skiff, MD;  Location: [facility] ENDOSCOPY;  Service: Gastroenterology;  Laterality: N/A;   EYE SURGERY     MASTECTOMY Right    SIMPLE MASTECTOMY WITH AXILLARY SENTINEL NODE BIOPSY Right 01/06/2020   Procedure: SIMPLE MASTECTOMY WITH AXILLARY SENTINEL NODE BIOPSY;  Surgeon: Dessa Reyes ORN, MD;  Location: [facility] ORS;  Service: General;  Laterality: Right;   TOTAL MASTECTOMY Left 12/14/2023   Procedure: MASTECTOMY, SIMPLE;  Surgeon: Jordis Laneta FALCON, MD;  Location: [facility] ORS;  Service: General;  Laterality: Left;   Family History  Problem Relation Age of Onset    Heart attack Mother    Cancer Father    Lung cancer Father    Breast cancer Sister 56       dx again 55   Heart attack Sister    Non-Hodgkin's lymphoma Sister 70   Heart attack Sister    Dementia Sister 28   Breast cancer Paternal Aunt    Breast cancer Maternal Aunt    Prostate cancer Paternal Grandfather        metastic, d. 28s   Colon cancer Neg Hx    Social History   Occupational History   Occupation: retired  Tobacco Use   Smoking status: Former    Current packs/day: 0.00    Average packs/day: 1 pack/day for 12.0 years (12.0 ttl pk-yrs)    Types: Cigarettes    Start date: 03/18/1967    Quit date: 03/18/1979    Years since quitting: 44.8   Smokeless tobacco: Never   Tobacco comments:    quit 40 years ago  Vaping Use   Vaping status: Never Used  Substance and Sexual Activity   Alcohol use: Yes    Comment: rarely, 1 glass of wine monthly or less   Drug use: Never   Sexual activity: Not Currently   Tobacco Counseling Counseling given: Not Answered Tobacco comments: quit 40 years ago  SDOH Screenings   Food Insecurity: No Food Insecurity (01/16/2024)  Housing: Low Risk (01/16/2024)  Transportation Needs: No Transportation Needs (01/16/2024)  Utilities: Not At Risk (01/16/2024)  Alcohol Screen: Low Risk (01/16/2024)  Depression (PHQ2-9): Low Risk (01/16/2024)  Financial Resource Strain: Low Risk (01/16/2024)  Physical Activity: Inactive (01/16/2024)  Social Connections: Moderately Integrated (01/16/2024)  Stress: No Stress Concern Present (01/16/2024)  Tobacco Use: Medium Risk (01/16/2024)  Health Literacy: Adequate Health Literacy (01/16/2024)   See flowsheets for full screening details  Depression Screen PHQ 2 & 9 Depression Scale- Over the past 2 weeks, how often have you been bothered by any of the following problems? Little interest or pleasure in doing things: 0 Feeling down, depressed, or hopeless (PHQ Adolescent also includes...irritable): 0 PHQ-2  Total Score: 0 Trouble falling or staying asleep, or sleeping too much: 0 Feeling tired or having little energy: 0 Poor appetite or overeating (PHQ Adolescent also includes...weight loss): 0 Feeling bad about yourself - or that you are a failure or have let yourself or your family down: 0 Trouble concentrating on things, such as reading the newspaper or watching television (PHQ Adolescent also includes...like school work): 0 Moving or speaking so slowly that other people could have noticed. Or the opposite - being so fidgety or restless that you have been moving around a lot more than usual: 0 Thoughts that you would be better off dead, or of hurting yourself in some way: 0 PHQ-9 Total Score: 0 If you checked off any problems, how difficult have these problems made it for you to do your work, take care of things at  home, or get along with other people?: Not difficult at all     Goals Addressed             This Visit's Progress    Patient Stated       Wants to start back exercising and walking             Objective:    Today's Vitals   01/16/24 1419  Weight: 143 lb (64.9 kg)  Height: 5' 1 (1.549 m)   Body mass index is 27.02 kg/m.  Hearing/Vision screen Hearing Screening - Comments:: Some issues, plans on getting her hearing checked Vision Screening - Comments:: Glasses, [facility], up to date, macular degeneration Immunizations and Health Maintenance Health Maintenance  Topic Date Due   DTaP/Tdap/Td (1 - Tdap) Never done   Zoster Vaccines- Shingrix (1 of 2) Never done   COVID-19 Vaccine (3 - Pfizer risk series) 04/25/2019   OPHTHALMOLOGY EXAM  09/15/2023   FOOT EXAM  05/23/2024   HEMOGLOBIN A1C  06/03/2024   Diabetic kidney evaluation - Urine ACR  07/12/2024   Bone Density Scan  11/06/2024   Mammogram  11/07/2024   Diabetic kidney evaluation - eGFR measurement  12/14/2024   Medicare Annual Wellness (AWV)  01/15/2025   Colonoscopy  07/14/2025   Pneumococcal  Vaccine: 50+ Years  Completed   Influenza Vaccine  Completed   Meningococcal B Vaccine  Aged Out        Assessment/Plan:  This is a routine wellness examination for Tracy Lawrence.  Patient Care Team: Dineen Rollene MATSU, FNP as PCP - General (Family Medicine) Dannielle Arlean FALCON, RN (Inactive) as Oncology Nurse Navigator (Oncology) Lenn Aran, MD as Radiation Oncologist (Radiation Oncology) [facility] (Optometry) Marcelino Gales, MD (Nephrology) Babara Call, MD as Consulting Physician (Oncology) Unk Corinn Skiff, MD as Consulting Physician (Gastroenterology) Georgina Shasta POUR, RN as Oncology Nurse Navigator  I have personally reviewed and noted the following in the patients chart:   Medical and social history Use of alcohol, tobacco or illicit drugs  Current medications and supplements including opioid prescriptions. Functional ability and status Nutritional status Physical activity Advanced directives List of other physicians Hospitalizations, surgeries, and ER visits in previous 12 months Vitals Screenings to include cognitive, depression, and falls Referrals and appointments  No orders of the defined types were placed in this encounter.  In addition, I have reviewed and discussed with patient certain preventive protocols, quality metrics, and best practice recommendations. A written personalized care plan for preventive services as well as general preventive health recommendations were provided to patient.   Angeline Fredericks, LPN   [id number]/83/7974   Return in 1 year (on 01/15/2025).  After Visit Summary: (MyChart) Due to this being a telephonic visit, the after visit summary with patients personalized plan was offered to patient via MyChart   Nurse Notes: Discussed the need to update Tetanus (Tdap) vaccine. Patient declines covid and shingles vaccines. Requesting last diabetic eye exam office notes.

## 2024-01-18 ENCOUNTER — Other Ambulatory Visit: Payer: Self-pay | Admitting: Family

## 2024-01-18 ENCOUNTER — Telehealth: Payer: Self-pay | Admitting: Family

## 2024-01-18 MED ORDER — DAPAGLIFLOZIN PROPANEDIOL 10 MG PO TABS: 10.0000 mg | ORAL_TABLET | Freq: Every day | ORAL | 3 refills | Status: AC

## 2024-01-18 NOTE — Telephone Encounter (Signed)
 Tracy Lawrence Recd note from [employer] [facility] re medication   Do I need to send in farxiga ?

## 2024-01-29 ENCOUNTER — Telehealth: Payer: Self-pay | Admitting: Family

## 2024-01-29 NOTE — Telephone Encounter (Signed)
"  Left message to call the office back   "

## 2024-01-29 NOTE — Telephone Encounter (Signed)
 Call pt I received notice that she is on colchicine.  Patient taking this medication for gout? It is not on her chart Her blood pressure medication hydrochlorothiazide  can increase risk of gout.  If she is having frequent gout flares, please schedule appointment with me

## 2024-01-31 NOTE — Telephone Encounter (Signed)
 Spoke to pt  discussed note below per Rollene she stated that she is not having any frequent Flare ups with Gout. She is taking Colchicine though pt verbalizes understanding

## 2024-02-02 NOTE — Telephone Encounter (Signed)
 Spoke to pt she is taking Allopurinol  100 mg daily for gout, added to med list

## 2024-02-07 ENCOUNTER — Other Ambulatory Visit: Payer: Self-pay | Admitting: Family

## 2024-02-07 DIAGNOSIS — F32A Depression, unspecified: Secondary | ICD-10-CM

## 2024-02-20 NOTE — Telephone Encounter (Signed)
 PAP: Application for Tracy Lawrence has been submitted to [employer] ([employer]), via fax

## 2024-02-21 NOTE — Telephone Encounter (Signed)
 PAP: Patient assistance application for Farxiga  has been approved by PAP Companies: [employer] from 02/20/2024 to 01/30/2025. Medication should be delivered to PAP Delivery: Home. For further shipping updates, please contact [employer] ([employer]) at [phone]. Patient ID is: [id number]

## 2024-03-07 ENCOUNTER — Ambulatory Visit: Admitting: Family

## 2024-03-07 ENCOUNTER — Encounter: Payer: Self-pay | Admitting: Family

## 2024-03-07 VITALS — BP 140/68 | HR 76 | Temp 98.3°F | Ht 61.0 in | Wt 147.0 lb

## 2024-03-07 DIAGNOSIS — R7309 Other abnormal glucose: Secondary | ICD-10-CM

## 2024-03-07 LAB — POCT GLYCOSYLATED HEMOGLOBIN (HGB A1C): HbA1c POC (<> result, manual entry): 6.1 %

## 2024-03-07 NOTE — Progress Notes (Unsigned)
 "  Assessment & Plan:  Elevated glucose -     POCT glycosylated hemoglobin (Hb A1C)     Return precautions given.   Risks, benefits, and alternatives of the medications and treatment plan prescribed today were discussed, and patient expressed understanding.   Education regarding symptom management and diagnosis given to patient on AVS either electronically or printed.  No follow-ups on file.  [name], FNP  Subjective:    Patient ID: Rollo KANDICE Simpler, female    DOB: [birth year], 84 y.o.   MRN: [id number]  CC: EVELYNA FOLKER is a 84 y.o. female who presents today for follow up.   HPI: Accompanied by husband  Feels well today.  No complaints.  She will feel mild dizzy when she first gets up from bed in the morning. She will sit up on the side of the bed.  Dizziness may last for seconds, quickly resolves.  Denies dizziness during other periods of life   Denies associated vertigo, vision changes, ha, syncope.      Status post left mastectomy 12/14/23  She has had follow-up Dr. Jordis  on 01/08/2024.  Follow-up scheduled with nephrology 03/25/2024   Allergies: Patient has no known allergies. Medications Ordered Prior to Encounter[1]  Review of Systems    Objective:    BP (!) 124/58   Pulse 76   Temp 98.3 F (36.8 C) (Oral)   Ht 5' 1 (1.549 m)   Wt 147 lb (66.7 kg)   LMP  (LMP Unknown)   SpO2 90%   BMI 27.78 kg/m  BP Readings from Last 3 Encounters:  03/07/24 (!) 124/58  01/08/24 (!) 154/83  01/02/24 (!) 153/54   Wt Readings from Last 3 Encounters:  03/07/24 147 lb (66.7 kg)  01/16/24 143 lb (64.9 kg)  01/08/24 143 lb (64.9 kg)    Physical Exam        [1]  Current Outpatient Medications on File Prior to Visit  Medication Sig Dispense Refill   allopurinol  (ZYLOPRIM ) 100 MG tablet TAKE 1 TABLET(100 MG) BY MOUTH DAILY 90 tablet 3   amLODipine (NORVASC) 2.5 MG tablet Take 2.5 mg by mouth daily.     Calcium  Carb-Cholecalciferol (CALCIUM  600 +  D PO) Take 1 tablet by mouth daily.     calcium  carbonate (TUMS - DOSED IN MG ELEMENTAL CALCIUM ) 500 MG chewable tablet Chew 1,000 mg by mouth daily as needed for indigestion or heartburn.     Carboxymethylcellul-Glycerin (LUBRICATING EYE DROPS OP) Place 1 drop into both eyes daily as needed (irritated eyes).     dapagliflozin  propanediol (FARXIGA ) 10 MG TABS tablet Take 1 tablet (10 mg total) by mouth daily. 90 tablet 3   escitalopram  (LEXAPRO ) 10 MG tablet TAKE 1 TABLET(10 MG) BY MOUTH DAILY 90 tablet 3   exemestane  (AROMASIN ) 25 MG tablet Take 1 tablet (25 mg total) by mouth daily after breakfast. 30 tablet 3   ezetimibe  (ZETIA ) 10 MG tablet TAKE 1 TABLET(10 MG) BY MOUTH DAILY 90 tablet 1   ferrous sulfate  325 (65 FE) MG tablet Take 1 tablet (325 mg total) by mouth daily.     hydrochlorothiazide  (MICROZIDE ) 12.5 MG capsule TAKE 1 CAPSULE(12.5 MG) BY MOUTH DAILY 90 capsule 3   lidocaine -prilocaine  (EMLA ) cream Apply to L/R areola of the breast and cover with plastic wrap one hour prior to leaving for surgery. 5 g 0   losartan  (COZAAR ) 100 MG tablet TAKE 1 TABLET(100 MG) BY MOUTH DAILY 90 tablet 3   Multiple Vitamins-Minerals (EYE  VITAMINS PO) Take 1 tablet by mouth daily.      rosuvastatin  (CRESTOR ) 40 MG tablet TAKE 1 TABLET BY MOUTH EVERY DAY FOR CHOLESTEROL 90 tablet 3   No current facility-administered medications on file prior to visit.   "

## 2024-03-07 NOTE — Patient Instructions (Signed)
 It is imperative that you are seen AT least twice per year for labs and monitoring. Monitor blood pressure at home and me 5-6 reading on separate days. Goal is less than 120/80, based on newest guidelines, however we certainly want to be less than 130/80;  if persistently higher, please make sooner follow up appointment so we can recheck you blood pressure and manage/ adjust medications.

## 2024-04-01 ENCOUNTER — Inpatient Hospital Stay

## 2024-04-01 ENCOUNTER — Inpatient Hospital Stay: Admitting: Oncology

## 2024-04-24 ENCOUNTER — Ambulatory Visit: Admitting: Surgery

## 2024-05-15 ENCOUNTER — Inpatient Hospital Stay

## 2024-05-15 ENCOUNTER — Inpatient Hospital Stay: Admitting: Oncology

## 2024-07-05 ENCOUNTER — Ambulatory Visit: Admitting: Family

## 2025-01-21 ENCOUNTER — Ambulatory Visit
